# Patient Record
Sex: Female | Born: 1955 | Race: White | Hispanic: No | Marital: Married | Smoking: Current every day smoker
Health system: Southern US, Community
[De-identification: ages and names within clinical notes are randomized; demographics above are authoritative.]

## PROBLEM LIST (undated history)

## (undated) DIAGNOSIS — F1010 Alcohol abuse, uncomplicated: Secondary | ICD-10-CM

## (undated) DIAGNOSIS — F329 Major depressive disorder, single episode, unspecified: Secondary | ICD-10-CM

## (undated) DIAGNOSIS — J449 Chronic obstructive pulmonary disease, unspecified: Secondary | ICD-10-CM

## (undated) DIAGNOSIS — M1990 Unspecified osteoarthritis, unspecified site: Secondary | ICD-10-CM

## (undated) DIAGNOSIS — K759 Inflammatory liver disease, unspecified: Secondary | ICD-10-CM

## (undated) DIAGNOSIS — K219 Gastro-esophageal reflux disease without esophagitis: Secondary | ICD-10-CM

## (undated) DIAGNOSIS — I10 Essential (primary) hypertension: Secondary | ICD-10-CM

## (undated) DIAGNOSIS — F32A Depression, unspecified: Secondary | ICD-10-CM

---

## 2008-12-07 ENCOUNTER — Emergency Department: Payer: Self-pay | Admitting: Emergency Medicine

## 2010-08-11 ENCOUNTER — Emergency Department: Payer: Self-pay | Admitting: Emergency Medicine

## 2011-01-21 ENCOUNTER — Ambulatory Visit: Payer: Self-pay

## 2011-02-04 ENCOUNTER — Ambulatory Visit: Payer: Self-pay | Admitting: Gynecologic Oncology

## 2011-02-11 ENCOUNTER — Ambulatory Visit: Payer: Self-pay | Admitting: Gynecologic Oncology

## 2011-03-01 ENCOUNTER — Ambulatory Visit: Payer: Self-pay | Admitting: Gynecologic Oncology

## 2012-08-26 ENCOUNTER — Emergency Department: Payer: Self-pay | Admitting: Emergency Medicine

## 2012-08-26 LAB — CBC
HCT: 44.2 % (ref 35.0–47.0)
HGB: 15.1 g/dL (ref 12.0–16.0)
MCH: 31.8 pg (ref 26.0–34.0)
MCHC: 34.2 g/dL (ref 32.0–36.0)
RDW: 15.3 % — ABNORMAL HIGH (ref 11.5–14.5)
WBC: 10.4 10*3/uL (ref 3.6–11.0)

## 2012-08-26 LAB — BASIC METABOLIC PANEL
BUN: 10 mg/dL (ref 7–18)
Co2: 27 mmol/L (ref 21–32)
Creatinine: 0.67 mg/dL (ref 0.60–1.30)
EGFR (African American): >60
EGFR (Non-African Amer.): >60

## 2013-04-22 ENCOUNTER — Emergency Department: Payer: Self-pay | Admitting: Emergency Medicine

## 2013-04-22 LAB — COMPREHENSIVE METABOLIC PANEL
ALK PHOS: 161 U/L — AB
ANION GAP: 9 (ref 7–16)
Albumin: 4.6 g/dL (ref 3.4–5.0)
BILIRUBIN TOTAL: 1 mg/dL (ref 0.2–1.0)
BUN: 13 mg/dL (ref 7–18)
CO2: 24 mmol/L (ref 21–32)
CREATININE: 0.95 mg/dL (ref 0.60–1.30)
Calcium, Total: 9.7 mg/dL (ref 8.5–10.1)
Chloride: 106 mmol/L (ref 98–107)
EGFR (Non-African Amer.): >60
GLUCOSE: 143 mg/dL — AB (ref 65–99)
Osmolality: 280 (ref 275–301)
Potassium: 4 mmol/L (ref 3.5–5.1)
SGOT(AST): 49 U/L — ABNORMAL HIGH (ref 15–37)
SGPT (ALT): 64 U/L (ref 12–78)
Sodium: 139 mmol/L (ref 136–145)
TOTAL PROTEIN: 9.2 g/dL — AB (ref 6.4–8.2)

## 2013-04-22 LAB — CBC WITH DIFFERENTIAL/PLATELET
BASOS ABS: 0.1 10*3/uL (ref 0.0–0.1)
Basophil %: 1.2 %
EOS PCT: 0.6 %
Eosinophil #: 0.1 10*3/uL (ref 0.0–0.7)
HCT: 53.5 % — ABNORMAL HIGH (ref 35.0–47.0)
HGB: 17.8 g/dL — ABNORMAL HIGH (ref 12.0–16.0)
Lymphocyte #: 3.1 10*3/uL (ref 1.0–3.6)
Lymphocyte %: 24.4 %
MCH: 32.6 pg (ref 26.0–34.0)
MCHC: 33.3 g/dL (ref 32.0–36.0)
MCV: 98 fL (ref 80–100)
MONO ABS: 0.6 x10 3/mm (ref 0.2–0.9)
MONOS PCT: 4.7 %
Neutrophil #: 8.9 10*3/uL — ABNORMAL HIGH (ref 1.4–6.5)
Neutrophil %: 69.1 %
Platelet: 391 10*3/uL (ref 150–440)
RBC: 5.47 10*6/uL — AB (ref 3.80–5.20)
RDW: 15.8 % — ABNORMAL HIGH (ref 11.5–14.5)
WBC: 12.9 10*3/uL — AB (ref 3.6–11.0)

## 2013-04-22 LAB — URINALYSIS, COMPLETE
BILIRUBIN, UR: NEGATIVE
BLOOD: NEGATIVE
Bacteria: NONE SEEN
Glucose,UR: NEGATIVE mg/dL (ref 0–75)
LEUKOCYTE ESTERASE: NEGATIVE
Nitrite: NEGATIVE
PH: 5 (ref 4.5–8.0)
Protein: 30
RBC,UR: 1 /HPF (ref 0–5)
SPECIFIC GRAVITY: 1.026 (ref 1.003–1.030)
WBC UR: 1 /HPF (ref 0–5)

## 2013-04-22 LAB — ETHANOL

## 2013-04-22 LAB — LIPASE, BLOOD: Lipase: 248 U/L (ref 73–393)

## 2015-08-31 ENCOUNTER — Encounter: Payer: Self-pay | Admitting: Emergency Medicine

## 2015-08-31 ENCOUNTER — Emergency Department: Payer: Self-pay

## 2015-08-31 ENCOUNTER — Emergency Department
Admission: EM | Admit: 2015-08-31 | Discharge: 2015-09-01 | Disposition: A | Discharge status: Home / Self Care | Payer: Self-pay | Attending: Emergency Medicine | Admitting: Emergency Medicine

## 2015-08-31 DIAGNOSIS — F1010 Alcohol abuse, uncomplicated: Secondary | ICD-10-CM | POA: Insufficient documentation

## 2015-08-31 DIAGNOSIS — J42 Unspecified chronic bronchitis: Secondary | ICD-10-CM | POA: Insufficient documentation

## 2015-08-31 DIAGNOSIS — F17200 Nicotine dependence, unspecified, uncomplicated: Secondary | ICD-10-CM | POA: Insufficient documentation

## 2015-08-31 DIAGNOSIS — F329 Major depressive disorder, single episode, unspecified: Secondary | ICD-10-CM | POA: Insufficient documentation

## 2015-08-31 DIAGNOSIS — J410 Simple chronic bronchitis: Secondary | ICD-10-CM

## 2015-08-31 HISTORY — DX: Major depressive disorder, single episode, unspecified: F32.9

## 2015-08-31 HISTORY — DX: Depression, unspecified: F32.A

## 2015-08-31 LAB — COMPREHENSIVE METABOLIC PANEL
ALBUMIN: 4.3 g/dL (ref 3.5–5.0)
ALK PHOS: 156 U/L — AB (ref 38–126)
ALT: 161 U/L — AB (ref 14–54)
ANION GAP: 11 (ref 5–15)
AST: 254 U/L — ABNORMAL HIGH (ref 15–41)
BUN: 10 mg/dL (ref 6–20)
CALCIUM: 8.5 mg/dL — AB (ref 8.9–10.3)
CHLORIDE: 99 mmol/L — AB (ref 101–111)
CO2: 25 mmol/L (ref 22–32)
Creatinine, Ser: 0.71 mg/dL (ref 0.44–1.00)
GFR calc Af Amer: >60 mL/min (ref >60)
GFR calc non Af Amer: >60 mL/min (ref >60)
GLUCOSE: 107 mg/dL — AB (ref 65–99)
Potassium: 4.2 mmol/L (ref 3.5–5.1)
SODIUM: 135 mmol/L (ref 135–145)
Total Bilirubin: 0.8 mg/dL (ref 0.3–1.2)
Total Protein: 8.2 g/dL — ABNORMAL HIGH (ref 6.5–8.1)

## 2015-08-31 LAB — CBC
HEMATOCRIT: 43.7 % (ref 35.0–47.0)
HEMOGLOBIN: 14.7 g/dL (ref 12.0–16.0)
MCH: 31.5 pg (ref 26.0–34.0)
MCHC: 33.7 g/dL (ref 32.0–36.0)
MCV: 93.3 fL (ref 80.0–100.0)
Platelets: 243 10*3/uL (ref 150–440)
RBC: 4.68 MIL/uL (ref 3.80–5.20)
RDW: 14.6 % — ABNORMAL HIGH (ref 11.5–14.5)
WBC: 5.8 10*3/uL (ref 3.6–11.0)

## 2015-08-31 LAB — ETHANOL: Alcohol, Ethyl (B): 324 mg/dL (ref <5)

## 2015-08-31 LAB — LIPASE, BLOOD: LIPASE: 34 U/L (ref 11–51)

## 2015-08-31 LAB — SALICYLATE LEVEL: Salicylate Lvl: <4 mg/dL (ref 2.8–30.0)

## 2015-08-31 LAB — ACETAMINOPHEN LEVEL

## 2015-08-31 NOTE — ED Notes (Addendum)
Pt comes to ED for detox of ETOH, pt states x 2821yr she's been heavy drinker with worsening x2-263months. Pt drinks aprox 1/4 quart of liquor and then beer in the afternoons. Pt last drink was around Pt states she takes vicodin for pain, denies any other drug use.  Denies any pain. Pt fidgety at this time.

## 2015-08-31 NOTE — ED Notes (Signed)
Crit EToH: 324

## 2015-08-31 NOTE — ED Notes (Addendum)
Pt presents to ED requesting help with detoxing from alcohol. Pt reports she drinks moonshine in the morning to get rid of the shakes and then drinks beer throughout the day. Pt states she has had a lot to drink today. Pt reports she has a cough and thinks she might have bronchitis. Pt reports shortness of breath. Pt speaking in complete sentences in triage without difficulty. Pt tearful in triage.

## 2015-09-01 LAB — ETHANOL: Alcohol, Ethyl (B): <5 mg/dL

## 2015-09-01 MED ORDER — THIAMINE HCL 100 MG/ML IJ SOLN: 100.0000 mg | Freq: Every day | INTRAMUSCULAR | Status: DC

## 2015-09-01 MED ORDER — PREDNISONE 20 MG PO TABS: 40.0000 mg | ORAL_TABLET | Freq: Every day | ORAL | Status: DC

## 2015-09-01 MED ORDER — IPRATROPIUM-ALBUTEROL 18-103 MCG/ACT IN AERO: 2.0000 | INHALATION_SPRAY | Freq: Four times a day (QID) | RESPIRATORY_TRACT | Status: DC | PRN

## 2015-09-01 MED ORDER — FOLIC ACID 1 MG PO TABS
1.0000 mg | ORAL_TABLET | Freq: Every day | ORAL | Status: DC
  Administered 2015-09-01: 1 mg via ORAL
  Filled 2015-09-01: qty 1

## 2015-09-01 MED ORDER — PREDNISONE 20 MG PO TABS
40.0000 mg | ORAL_TABLET | Freq: Once | ORAL | Status: AC
  Administered 2015-09-01: 40 mg via ORAL
  Filled 2015-09-01: qty 2

## 2015-09-01 MED ORDER — ONDANSETRON HCL 4 MG/2ML IJ SOLN
4.0000 mg | Freq: Once | INTRAMUSCULAR | Status: AC
  Administered 2015-09-01: 4 mg via INTRAVENOUS

## 2015-09-01 MED ORDER — IPRATROPIUM-ALBUTEROL 20-100 MCG/ACT IN AERS: 1.0000 | INHALATION_SPRAY | Freq: Four times a day (QID) | RESPIRATORY_TRACT | Status: DC | PRN

## 2015-09-01 MED ORDER — ONDANSETRON HCL 4 MG/2ML IJ SOLN
INTRAMUSCULAR | Status: AC
  Administered 2015-09-01: 4 mg via INTRAVENOUS
  Filled 2015-09-01: qty 2

## 2015-09-01 MED ORDER — ADULT MULTIVITAMIN W/MINERALS CH
1.0000 | ORAL_TABLET | Freq: Every day | ORAL | Status: DC
  Administered 2015-09-01: 1 via ORAL
  Filled 2015-09-01: qty 1

## 2015-09-01 MED ORDER — IPRATROPIUM-ALBUTEROL 0.5-2.5 (3) MG/3ML IN SOLN
3.0000 mL | Freq: Once | RESPIRATORY_TRACT | Status: AC
  Administered 2015-09-01: 3 mL via RESPIRATORY_TRACT

## 2015-09-01 MED ORDER — IPRATROPIUM-ALBUTEROL 0.5-2.5 (3) MG/3ML IN SOLN
3.0000 mL | Freq: Four times a day (QID) | RESPIRATORY_TRACT | Status: DC | PRN
  Filled 2015-09-01: qty 3

## 2015-09-01 MED ORDER — LORAZEPAM 2 MG PO TABS: 0.0000 mg | ORAL_TABLET | Freq: Four times a day (QID) | ORAL | Status: DC

## 2015-09-01 MED ORDER — LORAZEPAM 2 MG PO TABS: 0.0000 mg | ORAL_TABLET | Freq: Two times a day (BID) | ORAL | Status: DC

## 2015-09-01 MED ORDER — VITAMIN B-1 100 MG PO TABS
100.0000 mg | ORAL_TABLET | Freq: Every day | ORAL | Status: DC
  Administered 2015-09-01: 100 mg via ORAL
  Filled 2015-09-01: qty 1

## 2015-09-01 MED ORDER — LORAZEPAM 1 MG PO TABS
1.0000 mg | ORAL_TABLET | Freq: Four times a day (QID) | ORAL | Status: DC | PRN
  Administered 2015-09-01: 1 mg via ORAL
  Filled 2015-09-01: qty 1

## 2015-09-01 MED ORDER — LORAZEPAM 2 MG/ML IJ SOLN
1.0000 mg | Freq: Four times a day (QID) | INTRAMUSCULAR | Status: DC | PRN
  Administered 2015-09-01: 1 mg via INTRAVENOUS
  Filled 2015-09-01: qty 1

## 2015-09-01 NOTE — BH Assessment (Signed)
Received phone call from [facility] ([phone]) stating he received the updated BAC, via faxed, from the nurse Zollie Scale(Olivia). He requested patient get ready because they are about to come pick her up. Writer updated the patient's nurse Zollie Scale(Olivia) of the phone call.

## 2015-09-01 NOTE — ED Provider Notes (Signed)
-----------------------------------------   10:06 AM on 09/01/2015 -----------------------------------------  [facility] has accepted the patient and have arrived to pick her up. We'll discharge to [facility] for additional detox treatment.  Gayla DossEryka A Otha Monical, MD 09/01/15 1006

## 2015-09-01 NOTE — ED Notes (Signed)
Pt given breakfast, resting in bed, tolerating PO intake

## 2015-09-01 NOTE — ED Notes (Signed)
Resting quietly in bed.  No complaints at this time.  States last drink was approx 1700 08/31/15 and that she has been through detox before and knows what s/sx to report.

## 2015-09-01 NOTE — ED Notes (Signed)
Hooked patient back to monitor, satting in the high 80's put on 3L O2.

## 2015-09-01 NOTE — Discharge Instructions (Signed)
Alcohol Abuse and Nutrition Alcohol abuse is any pattern of alcohol consumption that harms your health, relationships, or work. Alcohol abuse can affect how your body breaks down and absorbs nutrients from food by causing your liver to work abnormally. Additionally, many people who abuse alcohol do not eat enough carbohydrates, protein, fat, vitamins, and minerals. This can cause poor nutrition (malnutrition) and a lack of nutrients (nutrient deficiencies), which can lead to further complications. Nutrients that are commonly lacking (deficient) among people who abuse alcohol include:  Vitamins.  Vitamin A. This is stored in your liver. It is important for your vision, metabolism, and ability to fight off infections (immunity).  B vitamins. These include vitamins such as folate, thiamin, and niacin. These are important in new cell growth and maintenance.  Vitamin C. This plays an important role in iron absorption, wound healing, and immunity.  Vitamin D. This is produced by your liver, but you can also get vitamin D from food. Vitamin D is necessary for your body to absorb and use calcium.  Minerals.  Calcium. This is important for your bones and your heart and blood vessel (cardiovascular) function.  Iron. This is important for blood, muscle, and nervous system functioning.  Magnesium. This plays an important role in muscle and nerve function, and it helps to control blood sugar and blood pressure.  Zinc. This is important for the normal function of your nervous system and digestive system (gastrointestinal tract). Nutrition is an essential component of therapy for alcohol abuse. Your health care provider or dietitian will work with you to design a plan that can help restore nutrients to your body and prevent potential complications. WHAT IS MY PLAN? Your dietitian may develop a specific diet plan that is based on your condition and any other complications you may have. A diet plan will  commonly include:  A balanced diet.  Grains: 6-8 oz per day.  Vegetables: 2-3 cups per day.  Fruits: 1-2 cups per day.  Meat and other protein: 5-6 oz per day.  Dairy: 2-3 cups per day.  Vitamin and mineral supplements. WHAT DO I NEED TO KNOW ABOUT ALCOHOL AND NUTRITION?  Consume foods that are high in antioxidants, such as grapes, berries, nuts, green tea, and dark green and orange vegetables. This can help to counteract some of the stress that is placed on your liver by consuming alcohol.  Avoid food and drinks that are high in fat and sugar. Foods such as sugared soft drinks, salty snack foods, and candy contain empty calories. This means that they lack important nutrients such as protein, fiber, and vitamins.  Eat frequent meals and snacks. Try to eat 5-6 small meals each day.  Eat a variety of fresh fruits and vegetables each day. This will help you get plenty of water, fiber, and vitamins in your diet.  Drink plenty of water and other clear fluids. Try to drink at least 48-64 oz (1.5-2 L) of water per day.  If you are a vegetarian, eat a variety of protein-rich foods. Pair whole grains with plant-based proteins at meals and snacks to obtain the greatest nutrient benefit from your food. For example, eat rice with beans, put peanut butter on whole-grain toast, or eat oatmeal with sunflower seeds.  Soak beans and whole grains overnight before cooking. This can help your body to absorb the nutrients more easily.  Include foods fortified with vitamins and minerals in your diet. Commonly fortified foods include milk, orange juice, cereal, and bread.  If you  are malnourished, your dietitian may recommend a high-protein, high-calorie diet. This may include: °¨ 2,000-3,000 calories (kilocalories) per day. °¨ 70-100 grams of protein per day. °· Your health care provider may recommend a complete nutritional supplement beverage. This can help to restore calories, protein, and vitamins to  your body. Depending on your condition, you may be advised to consume this instead of or in addition to meals. °· Limit your intake of caffeine. Replace drinks like coffee and black tea with decaffeinated coffee and herbal tea. °· Eat a variety of foods that are high in omega fatty acids. These include fish, nuts and seeds, and soybeans. These foods may help your liver to recover and may also stabilize your mood. °· Certain medicines may cause changes in your appetite, taste, and weight. Work with your health care provider and dietitian to make any adjustments to your medicines and diet plan. °· Include other healthy lifestyle choices in your daily routine. °¨ Be physically active. °¨ Get enough sleep. °¨ Spend time doing activities that you enjoy. °· If you are unable to take in enough food and calories by mouth, your health care provider may recommend a feeding tube. This is a tube that passes through your nose and throat, directly into your stomach. Nutritional supplement beverages can be given to you through the feeding tube to help you get the nutrients you need. °· Take vitamin or mineral supplements as recommended by your health care provider. °WHAT FOODS CAN I EAT? °Grains °Enriched pasta. Enriched rice. Fortified whole-grain bread. Fortified whole-grain cereal. Barley. Brown rice. Quinoa. Millet. °Vegetables °All fresh, frozen, and canned vegetables. Spinach. Kale. Artichoke. Carrots. Winter squash and pumpkin. Sweet potatoes. Broccoli. Cabbage. Cucumbers. Tomatoes. Sweet peppers. Green beans. Peas. Corn. °Fruits °All fresh and frozen fruits. Berries. Grapes. Mango. Papaya. Guava. Cherries. Apples. Bananas. Peaches. Plums. Pineapple. Watermelon. Cantaloupe. Oranges. Avocado. °Meats and Other Protein Sources °Beef liver. Lean beef. Pork. Fresh and canned chicken. Fresh fish. Oysters. Sardines. Canned tuna. Shrimp. Eggs with yolks. Nuts and seeds. Peanut butter. Beans and lentils. Soybeans.  Tofu. °Dairy °Whole, low-fat, and nonfat milk. Whole, low-fat, and nonfat yogurt. Cottage cheese. Sour cream. Hard and soft cheeses. °Beverages °Water. Herbal tea. Decaffeinated coffee. Decaffeinated green tea. 100% fruit juice. 100% vegetable juice. Instant breakfast shakes. °Condiments °Ketchup. Mayonnaise. Mustard. Salad dressing. Barbecue sauce. °Sweets and Desserts °Sugar-free ice cream. Sugar-free pudding. Sugar-free gelatin. °Fats and Oils °Butter. Vegetable oil, flaxseed oil, olive oil, and walnut oil. °Other °Complete nutrition shakes. Protein bars. Sugar-free gum. °The items listed above may not be a complete list of recommended foods or beverages. Contact your dietitian for more options. °WHAT FOODS ARE NOT RECOMMENDED? °Grains °Sugar-sweetened breakfast cereals. Flavored instant oatmeal. Fried breads. °Vegetables °Breaded or deep-fried vegetables. °Fruits °Dried fruit with added sugar. Candied fruit. Canned fruit in syrup. °Meats and Other Protein Sources °Breaded or deep-fried meats. °Dairy °Flavored milks. Fried cheese curds or fried cheese sticks. °Beverages °Alcohol. Sugar-sweetened soft drinks. Sugar-sweetened tea. Caffeinated coffee and tea. °Condiments °Sugar. Honey. Agave nectar. Molasses. °Sweets and Desserts °Chocolate. Cake. Cookies. Candy. °Other °Potato chips. Pretzels. Salted nuts. Candied nuts. °The items listed above may not be a complete list of foods and beverages to avoid. Contact your dietitian for more information. °  °This information is not intended to replace advice given to you by your health care provider. Make sure you discuss any questions you have with your health care provider. °  °Document Released: 01/09/2005 Document Revised: 04/07/2014 Document Reviewed: 10/18/2013 °Elsevier Interactive Patient   Education 2016 [employer]   Chronic Bronchitis Chronic bronchitis is a lasting inflammation of the bronchial tubes, which are the tubes that carry air into your lungs.  This is inflammation that occurs:   On most days of the week.   For at least three months at a time.   Over a period of two years in a row. When the bronchial tubes are inflamed, they start to produce mucus. The inflammation and buildup of mucus make it more difficult to breathe. Chronic bronchitis is usually a permanent problem and is one type of chronic obstructive pulmonary disease (COPD). People with chronic bronchitis are at greater risk for getting repeated colds, or respiratory infections. CAUSES  Chronic bronchitis most often occurs in people who have:  Long-standing, severe asthma.  A history of smoking.  Asthma and who also smoke. SIGNS AND SYMPTOMS  Chronic bronchitis may cause the following:   A cough that brings up mucus (productive cough).  Shortness of breath.  Early morning headache.  Wheezing.  Chest discomfort.   Recurring respiratory infections. DIAGNOSIS  Your health care provider may confirm the diagnosis by:  Taking your medical history.  Performing a physical exam.  Taking a chest X-ray.   Performing pulmonary function tests. TREATMENT  Treatment involves controlling symptoms with medicines, oxygen therapy, or making lifestyle changes, such as exercising and eating a healthy, well-balanced diet. Medicines could include:  Inhalers to improve air flow in and out of your lungs.  Antibiotics to treat bacterial infections, such as pneumonia, sinus infections, and acute bronchitis. As a preventative measure, your health care provider may recommend routine vaccinations for influenza and pneumonia. This is to prevent infection and hospitalization since you may be more at risk for these types of infections.  HOME CARE INSTRUCTIONS  Take medicines only as directed by your health care provider.   If you smoke cigarettes, chew tobacco, or use electronic cigarettes, quit. If you need help quitting, ask your health care provider.  Avoid pollen,  dust, animal dander, molds, smoke, and other things that cause shortness of breath or wheezing attacks.  Talk to your health care provider about possible exercise routines. Regular exercise is very important to help you feel better.  If you are prescribed oxygen use at home follow these guidelines:  Never smoke while using oxygen. Oxygen does not burn or explode, but flammable materials will burn faster in the presence of oxygen.  Keep a Government social research officer close by. Let your fire department know that you have oxygen in your home.  Warn visitors not to smoke near you when you are using oxygen. Put up "no smoking" signs in your home where you most often use the oxygen.  Regularly test your smoke detectors at home to make sure they work. If you receive care in your home from a nurse or other health care provider, he or she may also check to make sure your smoke detectors work.  Ask your health care provider whether you would benefit from a pulmonary rehabilitation program.  Do not wait to get medical care if you have any concerning symptoms. Delays could cause permanent injury and may be life threatening. SEEK MEDICAL CARE IF:  You have increased coughing or shortness of breath or both.  You have muscle aches.  You have chest pain.  Your mucus gets thicker.  Your mucus changes from clear or white to yellow, green, gray, or bloody. SEEK IMMEDIATE MEDICAL CARE IF:  Your usual medicines do not stop your wheezing.  You have increased difficulty breathing.   You have any problems with the medicine you are taking, such as a rash, itching, swelling, or trouble breathing. MAKE SURE YOU:   Understand these instructions.  Will watch your condition.  Will get help right away if you are not doing well or get worse.   This information is not intended to replace advice given to you by your health care provider. Make sure you discuss any questions you have with your health care provider.    Document Released: 01/02/2006 Document Revised: 04/07/2014 Document Reviewed: 04/25/2013 Elsevier Interactive Patient Education [employer]

## 2015-09-01 NOTE — ED Notes (Signed)
Pt resting in bed, 02 on, resp even and unlabored, pt in no acute distress, eyes closed

## 2015-09-01 NOTE — ED Notes (Signed)
Pt removed from o2, placed on RA, resting in bed in no distress

## 2015-09-01 NOTE — ED Notes (Signed)
Tele [facility] at bedside, interview in process.

## 2015-09-01 NOTE — ED Notes (Signed)
Pt accepted at [facility]

## 2015-09-01 NOTE — ED Provider Notes (Addendum)
[name] Emergency Department Provider Note  ____________________________________________  Time seen: Approximately 2:54 AM  I have reviewed the triage vital signs and the nursing notes.   HISTORY  Chief Complaint Addiction Problem; Cough; and Shortness of Breath    HPI Kathryn Banks is a 60 y.o. female reports a previous history of alcohol abuse. She reports that she's been drinking fairly heavily for the last 2-3 months, up to 18 beers a day and a shot of liquor for "the shakes" in the morning for the last several weeks.  Patient reports that she wants to stop drinking and this is affecting her life. She denies any hallucinations, withdrawal at this time, shakes, or thoughts about wanting to harm herself or anyone else. She does occasionally abuse hydrocodone but denies having taken any in the last 2 days.  Addition the patient reports that she's had a chronic nonproductive cough for about the last 2 months. She reports that she has seen her doctor and they're suspicious he may have "COPD" as she is still smoking. She denies any fevers chills or shortness of breath, though does wheeze from time to time. Reports his symptoms are ongoing they seem to be just slightly worse over the last 2 weeks.   Past Medical History  Diagnosis Date  . Depression     There are no active problems to display for this patient.   Past Surgical History  Procedure Laterality Date  . Cesarean section      Current Outpatient Rx  Name  Route  Sig  Dispense  Refill  . albuterol-ipratropium (COMBIVENT) 18-103 MCG/ACT inhaler   Inhalation   Inhale 2 puffs into the lungs every 6 (six) hours as needed for wheezing or shortness of breath.   1 Inhaler   0   . predniSONE (DELTASONE) 20 MG tablet   Oral   Take 2 tablets (40 mg total) by mouth daily with breakfast.   10 tablet   0     Allergies Review of patient's allergies indicates no known allergies.  No family history  on file.  Social History Social History  Substance Use Topics  . Smoking status: Current Every Day Smoker -- 2.00 packs/day  . Smokeless tobacco: None  . Alcohol Use: Yes    Review of Systems Constitutional: No fever/chills Eyes: No visual changes. ENT: No sore throat. Cardiovascular: Denies chest pain. Respiratory: Denies shortness of breath.Frequent dry cough and slight wheezing. Gastrointestinal: No abdominal pain.  No nausea, no vomiting.  No diarrhea.  No constipation. Genitourinary: Negative for dysuria. Musculoskeletal: Negative for back pain. Skin: Negative for rash. Neurological: Negative for headaches, focal weakness or numbness.  Denies pregnancy  10-point ROS otherwise negative.  Denies any history of complicated withdrawals, seizures, or requiring hospitalization or hallucinating during withdrawals previous. She has gone through detox several months ago. ____________________________________________   PHYSICAL EXAM:  VITAL SIGNS: ED Triage Vitals  Enc Vitals Group     BP 08/31/15 1845 140/89 mmHg     Pulse Rate 08/31/15 1845 72     Resp 08/31/15 1845 24     Temp 08/31/15 1845 98.1 F (36.7 C)     Temp Source 08/31/15 1845 Oral     SpO2 08/31/15 1845 95 %     Weight 08/31/15 1845 120 lb (54.432 kg)     Height 08/31/15 1845  (1.651 m)     Head Cir --      Peak Flow --      Pain  Score 08/31/15 1849 0     Pain Loc --      Pain Edu? --      Excl. in GC? --    Constitutional: Alert and oriented. Well appearing and in no acute distress.Pleasant, sits up and conversant. Eyes: Conjunctivae are normal. PERRL. EOMI. Head: Atraumatic. Nose: No congestion/rhinnorhea. Mouth/Throat: Mucous membranes are moist.  Oropharynx non-erythematous. Neck: No stridor.   Cardiovascular: Normal rate, regular rhythm. Grossly normal heart sounds.  Good peripheral circulation. Respiratory: Normal respiratory effort.  No retractions. Faint end expiratory wheezes. Occasional  dry nonproductive cough. Speaks in full sentences without use of accessory muscles or evidence of distress.  Gastrointestinal: Soft and nontender. No distention.  Musculoskeletal: No lower extremity tenderness nor edema.  No joint effusions. Neurologic:  Normal speech and language. No gross focal neurologic deficits are appreciated. No gait instability. Skin:  Skin is warm, dry and intact. No rash noted. Psychiatric: Mood and affect are normal. Speech and behavior are normal.  ____________________________________________   LABS (all labs ordered are listed, but only abnormal results are displayed)  Labs Reviewed  COMPREHENSIVE METABOLIC PANEL - Abnormal; Notable for the following:    Chloride 99 (*)    Glucose, Bld 107 (*)    Calcium 8.5 (*)    Total Protein 8.2 (*)    AST 254 (*)    ALT 161 (*)    Alkaline Phosphatase 156 (*)    All other components within normal limits  ETHANOL - Abnormal; Notable for the following:    Alcohol, Ethyl (B) 324 (*)    All other components within normal limits  ACETAMINOPHEN LEVEL - Abnormal; Notable for the following:    Acetaminophen (Tylenol), Serum <10 (*)    All other components within normal limits  CBC - Abnormal; Notable for the following:    RDW 14.6 (*)    All other components within normal limits  SALICYLATE LEVEL  LIPASE, BLOOD  URINE DRUG SCREEN, QUALITATIVE ([facility] ONLY)   ____________________________________________  EKG   ____________________________________________  RADIOLOGY  DG Chest 2 View (Final result) Result time: 08/31/15 19:23:54   Final result by Rad Results In Interface (08/31/15 19:23:54)   Narrative:   CLINICAL DATA: Alcohol detox.  EXAM: CHEST 2 VIEW  COMPARISON: 08/26/2012.  FINDINGS: The heart size and mediastinal contours are within normal limits. Both lungs are clear. The visualized skeletal structures are unremarkable. Scoliosis deformity is convex towards the left.  IMPRESSION: No  active cardiopulmonary disease.   Electronically Signed By: Signa Kellaylor Stroud M.D. On: 08/31/2015 19:23       ____________________________________________   PROCEDURES  Procedure(s) performed: None  Critical Care performed: No  ____________________________________________   INITIAL IMPRESSION / ASSESSMENT AND PLAN / ED COURSE  Pertinent labs & imaging results that were available during my care of the patient were reviewed by me and considered in my medical decision making (see chart for details).   1. Alcohol abuse. Presently voluntary, denies hallucinations or self harmful ideations or other concern. She does report withdrawals occurring in the mornings, but is presently showing no evidence of withdrawal. I will place her on withdrawal protocol, and have ordered consultation for assistance in finding detox for her. She does not have a history of complicated withdrawal presently appears quite stable.  2. Chronic cough. Appears most consistent with likely chronic bronchitis or likely chronic obstructive pulmonary disease. Afebrile, no leukocytosis, clear lungs and chest x-ray with no suggestion of pneumonia infiltrate or obvious infectious process. I will treat her with  a 5 day dose of steroids, and also ordered her nebulizer for relief of symptoms. The patient will follow-up with her primary care regarding this, and presently is stable and will continue to be monitored in her ED stay with continuation of prednisone and nebulizers as needed all awaiting detox placement.  ----------------------------------------- 2:58 AM on 09/01/2015 -----------------------------------------  The patient is resting comfortably. Her lungs are clear and she denies any concerns at present. I find her medically clear to continue her ED stay in the behavioral health unit pending TTS consult for detox. ____________________________________________   FINAL CLINICAL IMPRESSION(S) / ED  DIAGNOSES  Final diagnoses:  Simple chronic bronchitis (HCC)  Alcohol abuse      Sharyn Creamer, MD 09/01/15 0258  ----------------------------------------- 8:51 AM on 09/01/2015 -----------------------------------------  Ongoing care assigned to Dr. Chari Manning. Patient resting comfortably, was requiring some Ativan throughout the evening.  Sharyn Creamer, MD 09/01/15 [phone]
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
## 2015-09-26 ENCOUNTER — Ambulatory Visit: Payer: Self-pay | Attending: Internal Medicine

## 2015-09-26 ENCOUNTER — Ambulatory Visit
Admission: RE | Admit: 2015-09-26 | Discharge: 2015-09-26 | Disposition: A | Discharge status: Home / Self Care | Payer: Self-pay | Source: Ambulatory Visit | Attending: Internal Medicine | Admitting: Internal Medicine

## 2015-09-26 VITALS — BP 153/91 | HR 69 | Temp 98.1°F | Resp 24 | Ht 64.0 in | Wt 138.2 lb

## 2015-09-26 DIAGNOSIS — Z0000 Encounter for general adult medical examination without abnormal findings: Secondary | ICD-10-CM

## 2015-09-26 NOTE — Progress Notes (Signed)
Subjective:     Patient ID: Kathryn PoeDonna Banks, female   DOB: 04/03/1955, 60 y.o.   MRN: [id number]  HPI   Review of Systems     Objective:   Physical Exam  Pulmonary/Chest: Right breast exhibits no inverted nipple, no mass, no nipple discharge, no skin change and no tenderness. Left breast exhibits no inverted nipple, no mass, no nipple discharge, no skin change and no tenderness. Breasts are symmetrical.  Genitourinary: No labial fusion. There is no rash, tenderness, lesion or injury on the right labia. There is no rash, tenderness, lesion or injury on the left labia. Uterus is not deviated, not enlarged, not fixed and not tender. Cervix exhibits no motion tenderness, no discharge and no friability. Right adnexum displays no mass, no tenderness and no fullness. Left adnexum displays no mass, no tenderness and no fullness. No erythema, tenderness or bleeding in the vagina. No foreign body around the vagina. No signs of injury around the vagina. No vaginal discharge found.       Assessment:     60 year old female  [facility].  Patient screened, and meets [facility] eligibility.  Patient does not have insurance, Medicare or Medicaid.  Handout given on Affordable Care Act.  Instructed patient on breast self-exam using teach back methodCBE unremarkable.  No mass or lump palpated.  Noted protruding xyphoid process which was not described on previous exam.  Patient states PCP aware, and not concerned.  Patient has had 20 + pound weight loss since last [facility] visit in 2012.  Pelvic exam normal.  Observed nabothean cyst superior os.    Plan:     Sent for bilateral screening mammogram.  Specimen collected for pap.

## 2015-09-28 LAB — PAP LB AND HPV HIGH-RISK
HPV, HIGH-RISK: NEGATIVE
PAP Smear Comment: 0

## 2015-10-08 NOTE — Progress Notes (Unsigned)
Phoned patient with normal mammogram and pap results.  Copy to [facility].

## 2016-01-27 ENCOUNTER — Ambulatory Visit (HOSPITAL_COMMUNITY)
Admission: RE | Admit: 2016-01-27 | Discharge: 2016-01-27 | Disposition: A | Discharge status: Home / Self Care | Payer: Self-pay | Attending: Psychiatry | Admitting: Psychiatry

## 2016-01-27 ENCOUNTER — Encounter (HOSPITAL_COMMUNITY): Payer: Self-pay | Admitting: Behavioral Health

## 2016-01-27 NOTE — BH Assessment (Signed)
Assessment Note  Kathryn Banks is a  60 y.o. female [facility] (and accompanied by friend) who requested detox services.  Pt reported as follows:  She is consuming alcohol, opioids, valium, and xanax, with last use of alcohol, opioids, and valium occurring 01/27/16.  Pt was nauseated and was dry-heaving when assessed.  Pt reported that she drank half a 1/5 of vodka [facility].  When asked about substance use, Pt reported several psychosocial stressors, including the pending anniversary of her son's death and poor business performance.    Pt was dressed in street clothes and appeared somewhat disheveled.  She had poor eye contact, as she was experiencing dry heaves.  Pt's mood was preoccupied, and affect was irritable.  Pt endorsed despondency, insomnia, poor appetite, and lethargy.  Pt denied suicidal ideation, any past suicide attempts, homicidal ideation, auditory/visual hallucination, or self-injury.  Pt endorsed ongoing substance use.  Thought processes were slowed, and thought content was goal-oriented.  Memory and concentration were grossly intact.  Speech was slow.  Insight and judgment were fair.  Impulse control was poor.  Consulted with Irving BurtonL. Parks, NP, who determined that Pt did not meet inpatient criteria.  Provided Pt with outpatient/IOP substance use resources, and encouraged Pt to visit the ED to check for possible nausea.  Diagnosis: Polysubstance Use Disorder; Substance-induced mood disorder  Past Medical History:  Past Medical History:  Diagnosis Date  . Depression     Past Surgical History:  Procedure Laterality Date  . CESAREAN SECTION      Family History:  Family History  Problem Relation Age of Onset  . Breast cancer Neg Hx     Social History:  reports that she has been smoking.  She has been smoking about 2.00 packs per day. She does not have any smokeless tobacco history on file. She reports that she drinks alcohol. She reports that she  uses drugs, including Opium and Benzodiazepines.  Additional Social History:  Alcohol / Drug Use Pain Medications: See PTA Prescriptions: See PTA Over the Counter: See PTA History of alcohol / drug use?: Yes Substance #1 Name of Substance 1: Alcohol 1 - Amount (size/oz): (S) Varied (Pt stated that she drank 1/2 a fifth of vodka and beer) 1 - Frequency: Daily 1 - Duration: Ongoing 1 - Last Use / Amount: 01/27/16 (Last consumption was approx 2 pm 01/27/16) Substance #2 Name of Substance 2: Opioids 2 - Amount (size/oz): Varied 2 - Frequency: Daily where possible 2 x day 2 - Duration: Ongoing 2 - Last Use / Amount: 01/27/16 (Last use was about 1 pm 01/27/16) Substance #3 Name of Substance 3: Valium 3 - Amount (size/oz): Varied 3 - Frequency: Daily 3 - Duration: Ongoing 3 - Last Use / Amount: 01/27/16 Substance #4 Name of Substance 4: Xanax 4 - Duration: Ongoing 4 - Last Use / Amount: Not sure  CIWA: CIWA-Ar BP: (!) 144/97 Pulse Rate: 83 COWS:    Allergies: No Known Allergies  Home Medications:  (Not in a hospital admission)  OB/GYN Status:  Patient's last menstrual period was 09/25/2005 (approximate).  General Assessment Data [facility] TTS Assessment: In system Is this a Tele or Face-to-Face Assessment?: Face-to-Face Is this an Initial Assessment or a Re-assessment for this encounter?: Initial Assessment Marital status: Married Pregnancy Status: No Living Arrangements: Spouse/significant other Can pt return to current living arrangement?: Yes Admission Status: Voluntary Is patient capable of signing voluntary admission?: Yes Referral Source: Self/Family/Friend  Medical Screening Exam [facility] Walk-in ONLY) Medical Exam completed: Yes  Crisis Care Plan Living Arrangements: Spouse/significant other Name of Psychiatrist: None Name of Therapist: None  Education Status Is patient currently in school?: No  Risk to self with the  past 6 months Suicidal Ideation: No Has patient been a risk to self within the past 6 months prior to admission? : No Suicidal Intent: No Has patient had any suicidal intent within the past 6 months prior to admission? : No Is patient at risk for suicide?: No Suicidal Plan?: No Has patient had any suicidal plan within the past 6 months prior to admission? : No Access to Means: No What has been your use of drugs/alcohol within the last 12 months?: Alcohol (beer, vodka), opioids, valium, xanax Previous Attempts/Gestures: No Other Self Harm Risks: significant substance use Recent stressful life event(s): Loss (Comment) (Upcoming anniversary of son's death) Persecutory voices/beliefs?: No Depression: Yes Depression Symptoms: Despondent, Insomnia, Tearfulness, Feeling angry/irritable (poor appetite) Substance abuse history and/or treatment for substance abuse?: Yes ([facility]) Suicide prevention information given to non-admitted patients: Not applicable  Risk to Others within the past 6 months Homicidal Ideation: No Does patient have any lifetime risk of violence toward others beyond the six months prior to admission? : No Thoughts of Harm to Others: No Current Homicidal Intent: No Current Homicidal Plan: No Access to Homicidal Means: No History of harm to others?: No Assessment of Violence: None Noted Does patient have access to weapons?: Yes (Comment) (Guns at home for family defense) Criminal Charges Pending?: No Does patient have a court date: No Is patient on probation?: No  Psychosis Hallucinations: None noted Delusions: None noted  Mental Status Report Appearance/Hygiene: Unremarkable, Other (Comment) (Street clothes) Eye Contact: Poor Motor Activity: Unsteady, Unremarkable (dry heaves) Speech: Argumentative Level of Consciousness: Irritable Mood: Sad Affect: Irritable Anxiety Level: None Thought Processes: Coherent, Relevant Judgement: Impaired Orientation: Person, Place,  Time, Situation Obsessive Compulsive Thoughts/Behaviors: None  Cognitive Functioning Concentration: Fair Memory: Remote Intact, Recent Intact IQ: Average Insight: Fair Impulse Control: Poor Appetite: Poor Sleep: Decreased  ADLScreening ([facility] Assessment Services) Patient's cognitive ability adequate to safely complete daily activities?: Yes Patient able to express need for assistance with ADLs?: Yes Independently performs ADLs?: Yes (appropriate for developmental age)  Prior Inpatient Therapy Prior Inpatient Therapy: Yes (But -- Pt treated at [facility] for detox) Prior Therapy Dates: 2017 Prior Therapy Facilty/Provider(s): [facility] Reason for Treatment: Detox  Prior Outpatient Therapy Prior Outpatient Therapy: No  ADL Screening (condition at time of admission) Patient's cognitive ability adequate to safely complete daily activities?: Yes Is the patient deaf or have difficulty hearing?: No Does the patient have difficulty seeing, even when wearing glasses/contacts?: No Does the patient have difficulty concentrating, remembering, or making decisions?: No Patient able to express need for assistance with ADLs?: Yes Does the patient have difficulty dressing or bathing?: No Independently performs ADLs?: Yes (appropriate for developmental age) Does the patient have difficulty walking or climbing stairs?: No Weakness of Legs: None Weakness of Arms/Hands: None  Home Assistive Devices/Equipment Home Assistive Devices/Equipment: None  Therapy Consults (therapy consults require a physician order) PT Evaluation Needed: No OT Evalulation Needed: No SLP Evaluation Needed: No Abuse/Neglect Assessment (Assessment to be complete while patient is alone) Physical Abuse: Denies Verbal Abuse: Denies Sexual Abuse: Denies Exploitation of patient/patient's resources: Denies Self-Neglect: Denies Values / Beliefs Cultural Requests During Hospitalization: None Spiritual Requests During Hospitalization:  None Consults Spiritual Care Consult Needed: No Social Work Consult Needed: No Merchant navy officerAdvance Directives (For  Healthcare) Does patient have an advance directive?: No Would patient like information on creating an advanced directive?: No - patient declined information    Additional Information 1:1 In Past 12 Months?: No CIRT Risk: No Elopement Risk: No Does patient have medical clearance?: Yes     Disposition:  Disposition Initial Assessment Completed for this Encounter: Yes Disposition of Patient: Other dispositions Other disposition(s): Other (Comment) (Sent to ED for med check; recomment o/p)  On Site Evaluation by:   Reviewed with Physician:    Dorris Fetch Dell Briner 01/27/2016 3:16 PM

## 2016-01-27 NOTE — H&P (Signed)
Behavioral Health Medical Screening Exam  Kathryn PoeDonna Banks is an 60 y.o. female.  Total Time spent with patient: 15 minutes  Psychiatric Specialty Exam: Physical Exam  Constitutional: She appears well-developed.  HENT:  Head: Normocephalic.  Eyes: Pupils are equal, round, and reactive to light.  Neck: Normal range of motion.  Cardiovascular: Normal rate.   Respiratory: Effort normal.  GI: Soft.  Musculoskeletal: Normal range of motion.  Neurological: She is alert.  Skin: Skin is warm and dry.    Review of Systems  Psychiatric/Behavioral: Positive for substance abuse (etoh). Negative for depression, hallucinations, memory loss and suicidal ideas. The patient is not nervous/anxious and does not have insomnia.   All other systems reviewed and are negative.   BP (!) 144/97 (BP Location: Left Arm)   Pulse 83   Temp 98.7 F (37.1 C) (Oral)   Resp 18   LMP 09/25/2005 (Approximate)   SpO2 96%    General Appearance: Disheveled  Eye Contact:  Fair  Speech:  Garbled and Slurred  Volume:  Normal  Mood:  Angry and Depressed  Affect:  Non-Congruent, Depressed and Tearful  Thought Process:  Disorganized  Orientation:  Full (Time, Place, and Person)  Thought Content:  Illogical and intoxicated  Suicidal Thoughts:  No  Homicidal Thoughts:  No  Memory:  Immediate;   Fair Recent;   Fair Remote;   Fair  Judgement:  Poor  Insight:  Lacking  Psychomotor Activity:  Decreased  Concentration: Concentration: Fair and Attention Span: Fair  Recall:  Poor  Fund of Knowledge:Fair  Language: Fair  Akathisia:  No  Handed:  Right  AIMS (if indicated):     Assets:  Financial Resources/Insurance Housing Resilience Social Support  Sleep:       Musculoskeletal: Strength & Muscle Tone: within normal limits Gait & Station: normal Patient leans: Front  BP (!) 144/97 (BP Location: Left Arm)   Pulse 83   Temp 98.7 F (37.1 C) (Oral)   Resp 18   LMP 09/25/2005 (Approximate)   SpO2 96%    Recommendations:  Pt does not meet inpatient psychiatric admission criteria.   Laveda AbbeLaurie Britton Vaibhav Fogleman, NP 01/27/2016, 3:05 PM

## 2016-04-20 ENCOUNTER — Emergency Department: Payer: Self-pay

## 2016-04-20 ENCOUNTER — Encounter: Payer: Self-pay | Admitting: Emergency Medicine

## 2016-04-20 ENCOUNTER — Emergency Department
Admission: EM | Admit: 2016-04-20 | Discharge: 2016-04-20 | Disposition: A | Discharge status: Home / Self Care | Payer: Self-pay | Attending: Emergency Medicine | Admitting: Emergency Medicine

## 2016-04-20 DIAGNOSIS — Z79899 Other long term (current) drug therapy: Secondary | ICD-10-CM | POA: Insufficient documentation

## 2016-04-20 DIAGNOSIS — F17200 Nicotine dependence, unspecified, uncomplicated: Secondary | ICD-10-CM | POA: Insufficient documentation

## 2016-04-20 DIAGNOSIS — J189 Pneumonia, unspecified organism: Secondary | ICD-10-CM | POA: Insufficient documentation

## 2016-04-20 DIAGNOSIS — R531 Weakness: Secondary | ICD-10-CM

## 2016-04-20 HISTORY — DX: Alcohol abuse, uncomplicated: F10.10

## 2016-04-20 HISTORY — DX: Gastro-esophageal reflux disease without esophagitis: K21.9

## 2016-04-20 LAB — BLOOD CULTURE ID PANEL (REFLEXED)
ACINETOBACTER BAUMANNII: NOT DETECTED
CANDIDA ALBICANS: NOT DETECTED
CANDIDA GLABRATA: NOT DETECTED
CANDIDA TROPICALIS: NOT DETECTED
Candida krusei: NOT DETECTED
Candida parapsilosis: NOT DETECTED
Carbapenem resistance: NOT DETECTED
ENTEROBACTER CLOACAE COMPLEX: NOT DETECTED
ENTEROBACTERIACEAE SPECIES: DETECTED — AB
ESCHERICHIA COLI: DETECTED — AB
Enterococcus species: NOT DETECTED
HAEMOPHILUS INFLUENZAE: NOT DETECTED
Klebsiella oxytoca: NOT DETECTED
Klebsiella pneumoniae: NOT DETECTED
LISTERIA MONOCYTOGENES: NOT DETECTED
NEISSERIA MENINGITIDIS: NOT DETECTED
PROTEUS SPECIES: NOT DETECTED
PSEUDOMONAS AERUGINOSA: NOT DETECTED
STREPTOCOCCUS PNEUMONIAE: NOT DETECTED
STREPTOCOCCUS PYOGENES: NOT DETECTED
Serratia marcescens: NOT DETECTED
Staphylococcus aureus (BCID): NOT DETECTED
Staphylococcus species: NOT DETECTED
Streptococcus agalactiae: NOT DETECTED
Streptococcus species: NOT DETECTED

## 2016-04-20 LAB — ETHANOL

## 2016-04-20 LAB — BASIC METABOLIC PANEL
ANION GAP: 9 (ref 5–15)
BUN: 26 mg/dL — AB (ref 6–20)
CHLORIDE: 104 mmol/L (ref 101–111)
CO2: 23 mmol/L (ref 22–32)
Calcium: 8.5 mg/dL — ABNORMAL LOW (ref 8.9–10.3)
Creatinine, Ser: 1.51 mg/dL — ABNORMAL HIGH (ref 0.44–1.00)
GFR calc Af Amer: 43 mL/min — ABNORMAL LOW (ref >60)
GFR calc non Af Amer: 37 mL/min — ABNORMAL LOW (ref >60)
Glucose, Bld: 114 mg/dL — ABNORMAL HIGH (ref 65–99)
POTASSIUM: 4 mmol/L (ref 3.5–5.1)
SODIUM: 136 mmol/L (ref 135–145)

## 2016-04-20 LAB — URINALYSIS, COMPLETE (UACMP) WITH MICROSCOPIC
BACTERIA UA: NONE SEEN
BILIRUBIN URINE: NEGATIVE
Glucose, UA: NEGATIVE mg/dL
KETONES UR: NEGATIVE mg/dL
NITRITE: NEGATIVE
PH: 7 (ref 5.0–8.0)
PROTEIN: NEGATIVE mg/dL
Specific Gravity, Urine: 1.004 — ABNORMAL LOW (ref 1.005–1.030)

## 2016-04-20 LAB — URINE DRUG SCREEN, QUALITATIVE (ARMC ONLY)
AMPHETAMINES, UR SCREEN: NOT DETECTED
BARBITURATES, UR SCREEN: NOT DETECTED
Benzodiazepine, Ur Scrn: POSITIVE — AB
Cannabinoid 50 Ng, Ur ~~LOC~~: NOT DETECTED
Cocaine Metabolite,Ur ~~LOC~~: NOT DETECTED
MDMA (Ecstasy)Ur Screen: NOT DETECTED
METHADONE SCREEN, URINE: NOT DETECTED
Opiate, Ur Screen: NOT DETECTED
Phencyclidine (PCP) Ur S: NOT DETECTED
TRICYCLIC, UR SCREEN: NOT DETECTED

## 2016-04-20 LAB — CBC
HEMATOCRIT: 33.5 % — AB (ref 35.0–47.0)
HEMOGLOBIN: 11.5 g/dL — AB (ref 12.0–16.0)
MCH: 30.9 pg (ref 26.0–34.0)
MCHC: 34.4 g/dL (ref 32.0–36.0)
MCV: 90 fL (ref 80.0–100.0)
Platelets: 305 10*3/uL (ref 150–440)
RBC: 3.72 MIL/uL — ABNORMAL LOW (ref 3.80–5.20)
RDW: 15.5 % — AB (ref 11.5–14.5)
WBC: 18.5 10*3/uL — ABNORMAL HIGH (ref 3.6–11.0)

## 2016-04-20 LAB — LACTIC ACID, PLASMA: LACTIC ACID, VENOUS: 1.1 mmol/L (ref 0.5–1.9)

## 2016-04-20 LAB — POCT RAPID STREP A: STREPTOCOCCUS, GROUP A SCREEN (DIRECT): NEGATIVE

## 2016-04-20 MED ORDER — CEFTRIAXONE SODIUM-DEXTROSE 1-3.74 GM-% IV SOLR
1.0000 g | INTRAVENOUS | Status: DC
  Administered 2016-04-20: 1 g via INTRAVENOUS
  Filled 2016-04-20: qty 50

## 2016-04-20 MED ORDER — AMOXICILLIN 500 MG PO CAPS: 500.0000 mg | ORAL_CAPSULE | Freq: Three times a day (TID) | ORAL | 0 refills | Status: DC

## 2016-04-20 MED ORDER — SODIUM CHLORIDE 0.9 % IV BOLUS (SEPSIS)
1000.0000 mL | Freq: Once | INTRAVENOUS | Status: AC
  Administered 2016-04-20: 1000 mL via INTRAVENOUS

## 2016-04-20 MED ORDER — AZITHROMYCIN 250 MG PO TABS: 250.0000 mg | ORAL_TABLET | Freq: Every day | ORAL | 0 refills | Status: DC

## 2016-04-20 MED ORDER — DEXTROSE 5 % IV SOLN
500.0000 mg | Freq: Once | INTRAVENOUS | Status: AC
  Administered 2016-04-20: 500 mg via INTRAVENOUS
  Filled 2016-04-20: qty 500

## 2016-04-20 MED ORDER — CEFTRIAXONE SODIUM 1 G IJ SOLR: 1.0000 g | INTRAMUSCULAR | Status: DC

## 2016-04-20 MED ORDER — ACETAMINOPHEN 500 MG PO TABS
1000.0000 mg | ORAL_TABLET | Freq: Once | ORAL | Status: AC
  Administered 2016-04-20: 1000 mg via ORAL
  Filled 2016-04-20: qty 2

## 2016-04-20 NOTE — ED Notes (Signed)
Pt noted to be coming in the lobby doors, unsteady on her feet; says she went outside to find a cigarette; assisted pt to chair and asked to call for help should she need to get up again; verbalized understanding

## 2016-04-20 NOTE — ED Notes (Signed)
Patient transported to CT 

## 2016-04-20 NOTE — ED Notes (Signed)
Spoke with EDP regarding pt unable to go back to [facility] at this time. Consulted with TTS and social work to see if any other options of detox facilties that pt could be discharged to. None other available at this time. Pt denies SI or HI at this time.

## 2016-04-20 NOTE — Discharge Instructions (Signed)

## 2016-04-20 NOTE — ED Notes (Signed)
Pt from [facility] via POV with c/o "increased weakness" over unknown period of time, cough for 3 months and headache x 1 week; pt awake and alert at stat registration

## 2016-04-20 NOTE — ED Notes (Signed)
Patient tried to use the bathroom and was unable to make it and voided on the floor.  Patient given blue scrubs since she wet her own clothes.

## 2016-04-20 NOTE — [facility] Counselor (Signed)
Spoke to RN who stated pt was sent here from [facility] because of "increased weakness, cough and inability to walk on her own". Pt has been treated at the ED and is clear to be discharged with medications, however [facility] is refusing to take pt back. Pt is not SI, HI, and denies any AVH however does not need a TTS consult. Pt was at [facility] for alcohol detox. Social work consult has been requested instead for pt placement. RN stated she would call social work.   [address]Kristin Ariyan Brisendine PenermonLPC, [facility]A.

## 2016-04-20 NOTE — ED Triage Notes (Signed)
Patient here with complaint of earaches times 2 weeks. Patient states that since Wednesday she has become progressively weaker and has had headaches. Patient was brought in by [facility] where she is currently receiving detox for alcohol. Patient reports that he last drink was on Wednesday. Patient states that she was drinking an 18 pack of beer and some liquor daily.

## 2016-04-20 NOTE — BHH Counselor (Signed)
TTS spoke with pt regarding follow-up Tx options. Pt was provided with information on Substance Abuse Intensive Outpatient Tx with [facility] and [facility]. Pt verbalized her understanding of this and pt was receptive to these Tx options.   Pt provided this Clinical research associatewriter with her boyfriend's number (Rod Sutten: [phone]). Pt reports she currently lives with her boyfriend.

## 2016-04-20 NOTE — ED Notes (Signed)
Joni ReiningNicole at [facility] contacted about patient condition and she states that pt is unable to go back due to condition when patient left, "lethargic, unable to walk on her own". Pt ambulates with stand by assistance as she can be unsteady on her feet momentarily. Pt alert and oriented X 4 at this time. Will notify MD.

## 2016-04-20 NOTE — ED Provider Notes (Signed)
-----------------------------------------   3:23 PM on 04/20/2016 -----------------------------------------   Blood pressure 124/87, pulse 86, temperature 98.2 F (36.8 C), temperature source Oral, resp. rate 18, height 5\' 4"  (1.626 m), weight 132 lb (59.9 kg), last menstrual period 09/25/2005, SpO2 97 %.  Assuming care from Dr. Dolores FrameSung of [facility] P[facility]na Banks is a 61 y.o. female with a chief complaint of Weakness and Headache .    Patient returned to her baseline and is fully awake, neurologically intact, with no further complaints. She was diagnosed with pneumonia and started on azithromycin by Dr. Dolores FrameSung. We spoke with [facility] who refused to accept patient back. Patient is here voluntarily with no suicidality. She was there for alcohol detox. Patient was seen by [facility] and provided with outpatient resources for other centers for detox. She lives with her boyfriend who is coming to pick her up. Patient will be discharged with prescription and instructions left by Dr. Dolores FrameSung.   Nita Sicklearolina Jaquin Coy, MD 04/20/16 1525

## 2016-04-20 NOTE — ED Notes (Signed)
Attempted to get patient awake to get urine sample but she was unable to use the bathroom despite the bolus.  Will try again before shift change.

## 2016-04-20 NOTE — ED Provider Notes (Signed)
[facility] Emergency Department Provider Note   ____________________________________________   First MD Initiated Contact with Patient 04/20/16 [phone]     (approximate)  I have reviewed the triage vital signs and the nursing notes.   HISTORY  Chief Complaint Weakness and Headache  History limited by somnolence  HPI Kathryn Banks is a 61 y.o. female brought to the ED from [facility] with multiple medical complaints. Patient is currently a resident at [facility] for alcohol detox; last drink was 4 days ago.Reportedly complained to triage nurse of bilateral earaches for 2 weeks. Complains of a several day history of generalized weakness, headache, productive cough and myalgias. Denies chest pain or shortness of breath. Rest of history is limited secondary to patient's somnolence.   Past Medical History:  Diagnosis Date  . Acid reflux   . Depression   . ETOH abuse     There are no active problems to display for this patient.   Past Surgical History:  Procedure Laterality Date  . CESAREAN SECTION      Prior to Admission medications   Medication Sig Start Date End Date Taking? Authorizing Provider  albuterol-ipratropium (COMBIVENT) 18-103 MCG/ACT inhaler Inhale 2 puffs into the lungs every 6 (six) hours as needed for wheezing or shortness of breath. 09/01/15   Sharyn Creamer, MD  predniSONE (DELTASONE) 20 MG tablet Take 2 tablets (40 mg total) by mouth daily with breakfast. 09/01/15   Sharyn Creamer, MD    Allergies Patient has no known allergies.  Family History  Problem Relation Age of Onset  . Breast cancer Neg Hx     Social History Social History  Substance Use Topics  . Smoking status: Current Every Day Smoker    Packs/day: 2.00  . Smokeless tobacco: Not on file  . Alcohol use Yes     Comment: Daily -- beer and vodka    Review of Systems  Constitutional: No fever/chills. Eyes: No visual changes. ENT: Positive for bilateral ear pain. No sore  throat. Cardiovascular: Denies chest pain. Respiratory: Positive for cough. Denies shortness of breath. Gastrointestinal: No abdominal pain.  No nausea, no vomiting.  No diarrhea.  No constipation. Genitourinary: Negative for dysuria. Musculoskeletal: Negative for back pain. Skin: Negative for rash. Neurological: Positive for headache. Negative for focal weakness or numbness. Psychiatric:Positive for alcohol abuse.  10-point ROS otherwise negative.  ____________________________________________   PHYSICAL EXAM:  VITAL SIGNS: ED Triage Vitals [04/20/16 0052]  Enc Vitals Group     BP 119/78     Pulse Rate 91     Resp 18     Temp 98.2 F (36.8 C)     Temp Source Oral     SpO2 97 %     Weight 132 lb (59.9 kg)     Height 5\' 4"  (1.626 m)     Head Circumference      Peak Flow      Pain Score 8     Pain Loc      Pain Edu?      Excl. in GC?     Constitutional: Asleep, difficult to arouse. Disheveled appearing and in no acute distress. Eyes: Conjunctivae are normal. PERRL. EOMI. Head: Atraumatic. Ears: Cerumen in both ears. Nose: No congestion/rhinnorhea. Mouth/Throat: Mucous membranes are moist.  Oropharynx erythematous without tonsillar swelling, exudates or peritonsillar abscess.  There is no hoarse or muffled voice.  There is no drooling. Neck: No stridor.  Supple neck without meningismus. Hematological/Lymphatic/Immunilogical: No cervical lymphadenopathy. Cardiovascular: Normal rate, regular rhythm.  Grossly normal heart sounds.  Good peripheral circulation. Respiratory: Normal respiratory effort.  No retractions. Lungs CTAB. Loose cough noted. Gastrointestinal: Soft and nontender. No distention. No abdominal bruits. No CVA tenderness. Musculoskeletal: No lower extremity tenderness nor edema.  No joint effusions. Neurologic:  Drowsy but arousable to voice. Normal speech and language. No gross focal neurologic deficits are appreciated.  Skin:  Skin is warm, dry and intact.  No rash noted. Psychiatric: Mood and affect are normal. Speech and behavior are normal.  ____________________________________________   LABS (all labs ordered are listed, but only abnormal results are displayed)  Labs Reviewed  BASIC METABOLIC PANEL - Abnormal; Notable for the following:       Result Value   Glucose, Bld 114 (*)    BUN 26 (*)    Creatinine, Ser 1.51 (*)    Calcium 8.5 (*)    GFR calc non Af Amer 37 (*)    GFR calc Af Amer 43 (*)    All other components within normal limits  CBC - Abnormal; Notable for the following:    WBC 18.5 (*)    RBC 3.72 (*)    Hemoglobin 11.5 (*)    HCT 33.5 (*)    RDW 15.5 (*)    All other components within normal limits  CULTURE, GROUP A STREP (THRC)  CULTURE, BLOOD (ROUTINE X 2)  CULTURE, BLOOD (ROUTINE X 2)  ETHANOL  URINALYSIS, COMPLETE (UACMP) WITH MICROSCOPIC  LACTIC ACID, PLASMA  LACTIC ACID, PLASMA  POCT RAPID STREP A  CBG MONITORING, ED   ____________________________________________  EKG  ED ECG REPORT I, SUNG,JADE J, the attending physician, personally viewed and interpreted this ECG.   Date: 04/20/2016  EKG Time: 0051  Rate: 94  Rhythm: normal EKG, normal sinus rhythm  Axis: Normal  Intervals:none  ST&T Change: Nonspecific  ____________________________________________  RADIOLOGY  CT head interpreted per Dr. Manus GunningEhinger: No acute intracranial abnormality.  Chest 1 view interpreted per Dr. Jake SamplesFujinaga: Patchy atelectasis or infiltrate at the lung bases ____________________________________________   PROCEDURES  Procedure(s) performed: None  Procedures  Critical Care performed: No  ____________________________________________   INITIAL IMPRESSION / ASSESSMENT AND PLAN / ED COURSE  Pertinent labs & imaging results that were available during my care of the patient were reviewed by me and considered in my medical decision making (see chart for details).  61 year old female brought from alcohol detox  facility for generalized malaise and cold type symptoms. Review of records reveal that patient received trazodone, Zoloft and Wellbutrin last evening. This may account for patient's drowsiness. However, lobby nurse also noted patient to go outside and stumbled in. Lab work demonstrate leukocytosis and dehydration. Will obtain CT head, chest x-ray, initiate IV fluid resuscitation and observe in the ED.  Clinical Course as of Apr 20 746  Sun Apr 20, 2016  [id number] IV antibiotics ordered for pneumonia seen on x-ray.  [JS]  [id number] Patient attempted to provide urine specimen but voided on the floor. Slowly becoming more awake and alert. CT scan was negative. Suspect drowsiness is secondary to nighttime medications. Lactate is pending as well as IV antibiotics. Anticipate discharge once patient is fully awake and as long as she does not require oxygen. I am told patient may not be allowed back to [facility] given her medical problems. If that is the case, then [facility] will evaluate. Care transferred to Dr. Don PerkingVeronese.  [JS]    Clinical Course User Index [JS] Irean HongJade J Sung, MD     ____________________________________________   FINAL  CLINICAL IMPRESSION(S) / ED DIAGNOSES  Final diagnoses:  Weakness  Community acquired pneumonia, unspecified laterality      NEW MEDICATIONS STARTED DURING THIS VISIT:  New Prescriptions   No medications on file     Note:  This document was prepared using Dragon voice recognition software and may include unintentional dictation errors.    Irean Hong, MD 04/20/16 [phone]

## 2016-04-20 NOTE — ED Notes (Signed)
Patsy at [facility] called regarding patient and left her number [phone].  She will be available until 0745 then Joni Reiningicole will be the contact person after that.  During this conversation Patsy stated the patient probably will not be allowed back to [facility] because of her increasing medical problems.

## 2016-04-20 NOTE — Progress Notes (Addendum)
PHARMACY - PHYSICIAN COMMUNICATION CRITICAL VALUE ALERT - BLOOD CULTURE IDENTIFICATION (BCID)  Results for orders placed or performed during the hospital encounter of 04/20/16  Blood Culture ID Panel (Reflexed) (Collected: 04/20/2016  7:10 AM)  Result Value Ref Range   Enterococcus species NOT DETECTED NOT DETECTED   Listeria monocytogenes NOT DETECTED NOT DETECTED   Staphylococcus species NOT DETECTED NOT DETECTED   Staphylococcus aureus NOT DETECTED NOT DETECTED   Streptococcus species NOT DETECTED NOT DETECTED   Streptococcus agalactiae NOT DETECTED NOT DETECTED   Streptococcus pneumoniae NOT DETECTED NOT DETECTED   Streptococcus pyogenes NOT DETECTED NOT DETECTED   Acinetobacter baumannii NOT DETECTED NOT DETECTED   Enterobacteriaceae species DETECTED (A) NOT DETECTED   Enterobacter cloacae complex NOT DETECTED NOT DETECTED   Escherichia coli DETECTED (A) NOT DETECTED   Klebsiella oxytoca NOT DETECTED NOT DETECTED   Klebsiella pneumoniae NOT DETECTED NOT DETECTED   Proteus species NOT DETECTED NOT DETECTED   Serratia marcescens NOT DETECTED NOT DETECTED   Carbapenem resistance NOT DETECTED NOT DETECTED   Haemophilus influenzae NOT DETECTED NOT DETECTED   Neisseria meningitidis NOT DETECTED NOT DETECTED   Pseudomonas aeruginosa NOT DETECTED NOT DETECTED   Candida albicans NOT DETECTED NOT DETECTED   Candida glabrata NOT DETECTED NOT DETECTED   Candida krusei NOT DETECTED NOT DETECTED   Candida parapsilosis NOT DETECTED NOT DETECTED   Candida tropicalis NOT DETECTED NOT DETECTED    Name of physician (or Provider) Contacted: Per Dr. Zenda AlpersWebster, night BCID results go to charge RN. I spoke with Database administratorLeigh RN. Patient was discharged on amoxicillin and azithromycin. She'll follow up with Dr. Zenda AlpersWebster.  Changes to prescribed antibiotics required: None at this point.   1/22 0722 lab called back to report GPC aerobic bottle x 1 of 4, staph sp, mecA not detected. I spoke with Marisue IvanLiz in ED who will  follow up with MD/patient.   Carola FrostNathan A Maudell Stanbrough, Pharm.D., BCPS Clinical Pharmacist 04/20/2016  9:39 PM

## 2016-04-20 NOTE — ED Notes (Addendum)
Pt unable to void at this time due to having just went to urinate PTA of this RN.

## 2016-04-20 NOTE — ED Notes (Signed)
Upon entering room patient has pulled out IV to L AC, site bleeding onto floor. Asked patient why she has not hit call bell, pt has been instructed to hit call bell X 2 since this RN arrival. Pt assisted onto toilet and back into bed at this time.

## 2016-04-21 ENCOUNTER — Telehealth: Payer: Self-pay | Admitting: Emergency Medicine

## 2016-04-21 LAB — BLOOD CULTURE ID PANEL (REFLEXED)
ACINETOBACTER BAUMANNII: NOT DETECTED
CANDIDA ALBICANS: NOT DETECTED
CANDIDA GLABRATA: NOT DETECTED
Candida krusei: NOT DETECTED
Candida parapsilosis: NOT DETECTED
Candida tropicalis: NOT DETECTED
ENTEROBACTER CLOACAE COMPLEX: NOT DETECTED
ENTEROBACTERIACEAE SPECIES: NOT DETECTED
ENTEROCOCCUS SPECIES: NOT DETECTED
Escherichia coli: NOT DETECTED
Haemophilus influenzae: NOT DETECTED
Klebsiella oxytoca: NOT DETECTED
Klebsiella pneumoniae: NOT DETECTED
LISTERIA MONOCYTOGENES: NOT DETECTED
Methicillin resistance: NOT DETECTED
NEISSERIA MENINGITIDIS: NOT DETECTED
PROTEUS SPECIES: NOT DETECTED
PSEUDOMONAS AERUGINOSA: NOT DETECTED
STAPHYLOCOCCUS SPECIES: DETECTED — AB
STREPTOCOCCUS AGALACTIAE: NOT DETECTED
STREPTOCOCCUS PNEUMONIAE: NOT DETECTED
STREPTOCOCCUS SPECIES: NOT DETECTED
Serratia marcescens: NOT DETECTED
Staphylococcus aureus (BCID): NOT DETECTED
Streptococcus pyogenes: NOT DETECTED

## 2016-04-21 NOTE — ED Notes (Signed)
Left patient a phone message at 2300 on 04/20/16 for patient to return to the ED due to abnormal blood culture bottle.

## 2016-04-21 NOTE — Telephone Encounter (Signed)
Called patient due to positive blood culture to ask her to return to the ED for exam.  Left message asking pateint to return to the ED or call charge nurse for questions.

## 2016-04-22 LAB — CULTURE, GROUP A STREP (THRC)

## 2016-04-23 LAB — CULTURE, BLOOD (ROUTINE X 2)

## 2016-04-24 ENCOUNTER — Telehealth: Payer: Self-pay | Admitting: Emergency Medicine

## 2016-05-12 ENCOUNTER — Telehealth: Payer: Self-pay | Admitting: Emergency Medicine

## 2016-05-12 NOTE — Telephone Encounter (Signed)
Patient called me back.  She says she is feeling tired and worn out still.  She has not had her follow up with her pcp yet, and it is scheduled for Friday.  I explained the blood cultures, but asked that she call pcp to inform her of how she feels and assure that the pcp has reviewed the labwork from the ed visit.  Patient agrees.

## 2016-05-12 NOTE — Telephone Encounter (Signed)
Pt has left message asking me to call her.  I left her a message.

## 2016-09-08 ENCOUNTER — Emergency Department
Admission: EM | Admit: 2016-09-08 | Discharge: 2016-09-08 | Disposition: A | Discharge status: Home / Self Care | Payer: Self-pay | Attending: Emergency Medicine | Admitting: Emergency Medicine

## 2016-09-08 DIAGNOSIS — Z79899 Other long term (current) drug therapy: Secondary | ICD-10-CM | POA: Insufficient documentation

## 2016-09-08 DIAGNOSIS — F17200 Nicotine dependence, unspecified, uncomplicated: Secondary | ICD-10-CM | POA: Insufficient documentation

## 2016-09-08 DIAGNOSIS — F10920 Alcohol use, unspecified with intoxication, uncomplicated: Secondary | ICD-10-CM

## 2016-09-08 DIAGNOSIS — F10120 Alcohol abuse with intoxication, uncomplicated: Secondary | ICD-10-CM | POA: Insufficient documentation

## 2016-09-08 DIAGNOSIS — F1010 Alcohol abuse, uncomplicated: Secondary | ICD-10-CM

## 2016-09-08 LAB — COMPREHENSIVE METABOLIC PANEL
ALBUMIN: 4.3 g/dL (ref 3.5–5.0)
ALT: 60 U/L — AB (ref 14–54)
AST: 101 U/L — ABNORMAL HIGH (ref 15–41)
Alkaline Phosphatase: 144 U/L — ABNORMAL HIGH (ref 38–126)
Anion gap: 9 (ref 5–15)
BUN: 11 mg/dL (ref 6–20)
CHLORIDE: 108 mmol/L (ref 101–111)
CO2: 26 mmol/L (ref 22–32)
CREATININE: 0.79 mg/dL (ref 0.44–1.00)
Calcium: 9 mg/dL (ref 8.9–10.3)
GFR calc non Af Amer: >60 mL/min (ref >60)
Glucose, Bld: 92 mg/dL (ref 65–99)
Potassium: 4.7 mmol/L (ref 3.5–5.1)
SODIUM: 143 mmol/L (ref 135–145)
Total Bilirubin: 0.8 mg/dL (ref 0.3–1.2)
Total Protein: 8.8 g/dL — ABNORMAL HIGH (ref 6.5–8.1)

## 2016-09-08 LAB — CBC
HEMATOCRIT: 43.9 % (ref 35.0–47.0)
Hemoglobin: 14.9 g/dL (ref 12.0–16.0)
MCH: 29.5 pg (ref 26.0–34.0)
MCHC: 33.9 g/dL (ref 32.0–36.0)
MCV: 87 fL (ref 80.0–100.0)
Platelets: 296 10*3/uL (ref 150–440)
RBC: 5.05 MIL/uL (ref 3.80–5.20)
RDW: 19.6 % — AB (ref 11.5–14.5)
WBC: 8.7 10*3/uL (ref 3.6–11.0)

## 2016-09-08 LAB — URINE DRUG SCREEN, QUALITATIVE (ARMC ONLY)
AMPHETAMINES, UR SCREEN: NOT DETECTED
BENZODIAZEPINE, UR SCRN: POSITIVE — AB
Barbiturates, Ur Screen: NOT DETECTED
Cannabinoid 50 Ng, Ur ~~LOC~~: NOT DETECTED
Cocaine Metabolite,Ur ~~LOC~~: NOT DETECTED
MDMA (Ecstasy)Ur Screen: NOT DETECTED
Methadone Scn, Ur: NOT DETECTED
Opiate, Ur Screen: NOT DETECTED
PHENCYCLIDINE (PCP) UR S: NOT DETECTED
Tricyclic, Ur Screen: NOT DETECTED

## 2016-09-08 LAB — ETHANOL: Alcohol, Ethyl (B): 359 mg/dL (ref <5)

## 2016-09-08 MED ORDER — LORAZEPAM 2 MG PO TABS
2.0000 mg | ORAL_TABLET | Freq: Once | ORAL | Status: AC
  Administered 2016-09-08: 2 mg via ORAL
  Filled 2016-09-08: qty 1

## 2016-09-08 NOTE — ED Notes (Signed)
Patient assigned to appropriate care area.  Introduced myself to her  Patient oriented to unit/care area: Informed that, for their safety, care areas are designed for safety and monitored by security cameras at all times; and visiting hours explained to patient. Patient verbalizes understanding, and verbal contract for safety obtained  Environment secured  Clothing changed by traci in triage  - one ring remains on her left ring finger - not removed per policy - pt adamant about not removing it now  We will have to allow her to calm down then retry to remove    1/1 bags of belongings labeled and placed in storage

## 2016-09-08 NOTE — ED Triage Notes (Addendum)
Pt arrived via EMS from home for reports of drinking an entire bottle of cherry moonshine. Pt appears intoxicated in triage. EMS reports VSS. Pt denies SI/HI.

## 2016-09-08 NOTE — ED Notes (Signed)

## 2016-09-08 NOTE — ED Notes (Signed)
Pt eating dinner tray °

## 2016-09-08 NOTE — ED Notes (Signed)
Pt woke up and wants to go home.  md aware.

## 2016-09-08 NOTE — BH Assessment (Signed)
TTS unable to complete consult at this time. Patient was too impaired.

## 2016-09-08 NOTE — ED Notes (Signed)
meds given.  Pt tearful  Sitting in floor.

## 2016-09-08 NOTE — ED Notes (Signed)
Pt sleeping. 

## 2016-09-08 NOTE — ED Notes (Signed)
PT  VOL °

## 2016-09-08 NOTE — Discharge Instructions (Signed)
You were seen in the emergency department for alcohol intoxication.  Please seek help from the recommended resources for assistance with your alcohol dependence.  If you have any thoughts of hurting herself or others, please call 911 or return to the emergency department.  Please avoid drug and alcohol use.  Never drive a vehicle or operate machinery while intoxicated.  

## 2016-09-08 NOTE — ED Provider Notes (Signed)
[facility] Emergency Department Provider Note  ____________________________________________   First MD Initiated Contact with Patient 09/08/16 1458     (approximate)  I have reviewed the triage vital signs and the nursing notes.   HISTORY  Chief Complaint Alcohol Intoxication  Level 5 caveat:  history/ROS limited by acute intoxication  HPI Kathryn Banks is a 61 y.o. female with a history of alcohol abuse who presents for evaluation of alcohol intoxication.She was brought by EMS after someone called due to concern that she drank an entire bottle of store bought moonshine.  She admits that she has been drinking today and "maybe" a bit more than usual.  She is clearly intoxicated at this time but awakens to light voice and light touch.  She is aware that she is in the hospital and is able to clearly verbalize that she was not attempting to harm herself.  She admits also to taking some oxycodone that she bought on the street along with alcohol.  She denies suicidal ideation and homicidal ideation.  She was surprised to learn that we felt she was still intoxicated because she states "I feel fine".  She denies any acute medical issues including fever/chills, chest pain, shortness of breath, nausea, vomiting, abdominal pain.  She definitively states that the moonshine she was drinking was bought at the [facility] store and was not homemade.   Past Medical History:  Diagnosis Date  . Acid reflux   . Depression   . ETOH abuse     There are no active problems to display for this patient.   Past Surgical History:  Procedure Laterality Date  . CESAREAN SECTION      Prior to Admission medications   Medication Sig Start Date End Date Taking? Authorizing Provider  albuterol-ipratropium (COMBIVENT) 18-103 MCG/ACT inhaler Inhale 2 puffs into the lungs every 6 (six) hours as needed for wheezing or shortness of breath. 09/01/15   Sharyn Creamer, MD  amoxicillin (AMOXIL) 500 MG  capsule Take 1 capsule (500 mg total) by mouth 3 (three) times daily. 04/20/16   Irean Hong, MD  azithromycin (ZITHROMAX) 250 MG tablet Take 1 tablet (250 mg total) by mouth daily. 04/20/16   Irean Hong, MD  buPROPion (WELLBUTRIN SR) 150 MG 12 hr tablet Take 150 mg by mouth 2 (two) times daily. 12/20/14   [provider]  omeprazole (PRILOSEC) 10 MG capsule Take 10 mg by mouth daily.    [provider]  predniSONE (DELTASONE) 20 MG tablet Take 2 tablets (40 mg total) by mouth daily with breakfast. 09/01/15   Sharyn Creamer, MD  sertraline (ZOLOFT) 100 MG tablet Take 150 mg by mouth daily. 08/01/15   [provider]    Allergies Patient has no known allergies.  Family History  Problem Relation Age of Onset  . Breast cancer Neg Hx     Social History Social History  Substance Use Topics  . Smoking status: Current Every Day Smoker    Packs/day: 2.00  . Smokeless tobacco: Not on file  . Alcohol use Yes     Comment: Daily -- beer and vodka    Review of Systems Level 5 caveat:  history/ROS limited by acute intoxication  ____________________________________________   PHYSICAL EXAM:  VITAL SIGNS: ED Triage Vitals [09/08/16 1350]  Enc Vitals Group     BP (!) 129/91     Pulse Rate 80     Resp (!) 22     Temp 97.5 F (36.4 C)  Temp Source Oral     SpO2 93 %     Weight 54.4 kg (120 lb)     Height 1.524 m (5')     Head Circumference      Peak Flow      Pain Score      Pain Loc      Pain Edu?      Excl. in GC?     Constitutional: Alert and oriented To person and place.  Appears intoxicated but is in no acute distress Eyes: Conjunctivae are normal. PERRL. EOMI. Head: Atraumatic. Nose: No congestion/rhinnorhea. Mouth/Throat: Mucous membranes are moist. Neck: No stridor.  No meningeal signs.   Cardiovascular: Normal rate, regular rhythm. Good peripheral circulation. Grossly normal heart sounds. Respiratory: Normal respiratory effort.  No retractions.  Lungs CTAB. Gastrointestinal: Soft and nontender. No distention.  Musculoskeletal: No lower extremity tenderness nor edema. No gross deformities of extremities. Neurologic:  Slurred speech and language. No other gross focal neurologic deficits are appreciated.  Skin:  Skin is warm, dry and intact. No rash noted. Psychiatric: Mood and affect are normal in spite of intoxication.  Denies suicidal ideation and homicidal ideation. ____________________________________________   LABS (all labs ordered are listed, but only abnormal results are displayed)  Labs Reviewed  COMPREHENSIVE METABOLIC PANEL - Abnormal; Notable for the following:       Result Value   Total Protein 8.8 (*)    AST 101 (*)    ALT 60 (*)    Alkaline Phosphatase 144 (*)    All other components within normal limits  ETHANOL - Abnormal; Notable for the following:    Alcohol, Ethyl (B) 359 (*)    All other components within normal limits  CBC - Abnormal; Notable for the following:    RDW 19.6 (*)    All other components within normal limits  URINE DRUG SCREEN, QUALITATIVE ([facility] ONLY) - Abnormal; Notable for the following:    Benzodiazepine, Ur Scrn POSITIVE (*)    All other components within normal limits   ____________________________________________  EKG  None - EKG not ordered by ED physician ____________________________________________  RADIOLOGY   No results found.  ____________________________________________   PROCEDURES  Critical Care performed: No   Procedure(s) performed:   Procedures   ____________________________________________   INITIAL IMPRESSION / ASSESSMENT AND PLAN / ED COURSE  Pertinent labs & imaging results that were available during my care of the patient were reviewed by me and considered in my medical decision making (see chart for details).  All the patient is quite intoxicated with an alcohol level greater than 350, she is oriented to person and place and definitively  denies any suicidal ideation and is very firm that she was "just drinking" (and taking oxycodone) and had no intention of harming herself.  She has been through detox before and did not indicate any desire or intention of wanting to stop drinking at this time.  She does not meet criteria for involuntary commitment at this time but she also does not have the capacity to make any decisions on her own while intoxicated.  She is willing to stay voluntarily in the emergency department in order to sober up and then we can reassess the plan but at this time I will not place her under involuntary commitment because she does not present an immediate danger to herself, but I cannot allow her to leave at this time because she could be a danger to herself.  She is comfortable waiting.  I have  put her on CIWA protocol given that she has a chronic alcoholic and may start showing signs of withdrawal while her ethanol level is still elevated.   Clinical Course as of Sep 09 2323  Mon Sep 08, 2016  2215 The patient is awake, alert, ambulatory without any difficulty and with a steady gait.  She is coherent and asking to go, reiterating that she has no suicidal ideation.  She has a sober adult coming to pick her up.  I gave my usual and customary return precautions.     [CF]    Clinical Course User Index [CF] Loleta Rose, MD    ____________________________________________  FINAL CLINICAL IMPRESSION(S) / ED DIAGNOSES  Final diagnoses:  Alcoholic intoxication without complication (HCC)  ETOH abuse     MEDICATIONS GIVEN DURING THIS VISIT:  Medications  LORazepam (ATIVAN) tablet 2 mg (2 mg Oral Given 09/08/16 1621)     NEW OUTPATIENT MEDICATIONS STARTED DURING THIS VISIT:  Discharge Medication List as of 09/08/2016 10:16 PM      Discharge Medication List as of 09/08/2016 10:16 PM      Discharge Medication List as of 09/08/2016 10:16 PM       Note:  This document was prepared using Dragon voice  recognition software and may include unintentional dictation errors.   Loleta Rose, MD 09/08/16 2325

## 2016-09-08 NOTE — ED Notes (Signed)
Pt woke up, walking steady, stating she is ready to be discharged.  Dr notified.  States she can call for a ride home.  Pt calling boyfriend for ride.

## 2016-09-08 NOTE — ED Notes (Signed)
Pt dressed out into appropriate behavioral health clothing. Pt belongings consist of a apir of blue short, a blue shirt and a pair of blue panties. Pt has a ring on left hand that is very hard to take off.

## 2016-09-08 NOTE — ED Notes (Signed)
Resumed care from Kenzley Ke t. rn   Pt sleeping. Easily aroused.

## 2016-09-08 NOTE — ED Notes (Signed)
BAC 359 - notified  rifenbark of value  -

## 2016-09-08 NOTE — ED Notes (Signed)
TTS was unable to assess at this time, as patient has been medicated and is unable to participate with the assessment.

## 2017-10-13 ENCOUNTER — Other Ambulatory Visit: Payer: Self-pay

## 2017-10-13 ENCOUNTER — Encounter
Admission: RE | Admit: 2017-10-13 | Discharge: 2017-10-13 | Disposition: A | Discharge status: Home / Self Care | Payer: Self-pay | Source: Ambulatory Visit | Attending: Orthopedic Surgery | Admitting: Orthopedic Surgery

## 2017-10-13 HISTORY — DX: Unspecified osteoarthritis, unspecified site: M19.90

## 2017-10-13 HISTORY — DX: Chronic obstructive pulmonary disease, unspecified: J44.9

## 2017-10-13 HISTORY — DX: Essential (primary) hypertension: I10

## 2017-10-13 HISTORY — DX: Inflammatory liver disease, unspecified: K75.9

## 2017-10-13 NOTE — Patient Instructions (Signed)
Your procedure is scheduled on: 10-15-17 THURSDAY Report to Same Day Surgery 2nd floor medical mall [facility](Medical Mall Entrance-take elevator on left to 2nd floor.  Check in with surgery information desk.) To find out your arrival time please call [phone] between 1PM - 3PM [facility]  Remember: Instructions that are not followed completely may result in serious medical risk, up to and including death, or upon the discretion of your surgeon and anesthesiologist your surgery may need to be rescheduled.    _x___ 1. Do not eat food after midnight the night before your procedure. NO GUM OR CANDY AFTER MIDNIGHT.  You may drink clear liquids up to 2 hours before you are scheduled to arrive at the hospital for your procedure.  Do not drink clear liquids within 2 hours of your scheduled arrival to the hospital.  Clear liquids include  --Water or Apple juice without pulp  --Clear carbohydrate beverage such as ClearFast or Gatorade  --Black Coffee or Clear Tea (No milk, no creamers, do not add anything to the coffee or Tea     __x__ 2. No Alcohol for 24 hours before or after surgery.   __x__3. No Smoking or e-cigarettes for 24 prior to surgery.  Do not use any chewable tobacco products for at least 6 hour prior to surgery   ____  4. Bring all medications with you on the day of surgery if instructed.    __x__ 5. Notify your doctor if there is any change in your medical condition     (cold, fever, infections).    x___6. On the morning of surgery brush your teeth with toothpaste and water.  You may rinse your mouth with mouth wash if you wish.  Do not swallow any toothpaste or mouthwash.   Do not wear jewelry, make-up, hairpins, clips or nail polish.  Do not wear lotions, powders, or perfumes. You may wear deodorant.  Do not shave 48 hours prior to surgery. Men may shave face and neck.  Do not bring valuables to the hospital.    [facility] not [facility] for any belongings or  valuables.               Contacts, dentures or bridgework may not be worn into surgery.  Leave your suitcase in the car. After surgery it may be brought to your room.  For patients admitted to the hospital, discharge time is determined by your treatment team.  _  Patients discharged the day of surgery will not be allowed to drive home.  You will need someone to drive you home and stay with you the night of your procedure.    Please read over the following fact sheets that you were given:   [facility]   _x___ TAKE THE FOLLOWING MEDICATION THE MORNING OF SURGERY WITH A SMALL SIP OF WATER. These include:  1. PRILOSEC (OMEPRAZOLE)  2. TAKE A PRILOSEC THE NIGHT BEFORE YOUR SURGERY  3. YOU MAY TAKE TRAMADOL IF NEEDED AM OF SURGERY  4.  5.  6.  ____Fleets enema or Magnesium Citrate as directed.   _x___ Use CHG Soap or sage wipes as directed on instruction sheet   _X___ Use inhalers on the day of surgery and bring to hospital day of surgery-USE YOUR COMBIVENT AT HOME DAY OF SURGERY AND BRING INHALER TO HOSPITAL   ____ Stop Metformin and Janumet 2 days prior to surgery.    ____ Take 1/2 of usual insulin dose the night before  surgery and none on the morning surgery.   ____ Follow recommendations from Cardiologist, Pulmonologist or PCP regarding stopping Aspirin, Coumadin, Plavix ,Eliquis, Effient, or Pradaxa, and Pletal.  X____Stop Anti-inflammatories such as Advil, Aleve, Ibuprofen, Motrin, Naproxen, Naprosyn, Goodies powders or aspirin products NOW-OK to take Tylenol    ____ Stop supplements until after surgery.     ____ Bring C-Pap to the hospital.

## 2017-10-14 ENCOUNTER — Encounter: Payer: Self-pay | Admitting: *Deleted

## 2017-10-14 ENCOUNTER — Encounter
Admission: RE | Admit: 2017-10-14 | Discharge: 2017-10-14 | Disposition: A | Discharge status: Home / Self Care | Payer: Self-pay | Source: Ambulatory Visit | Attending: Orthopedic Surgery | Admitting: Orthopedic Surgery

## 2017-10-14 DIAGNOSIS — J449 Chronic obstructive pulmonary disease, unspecified: Secondary | ICD-10-CM | POA: Insufficient documentation

## 2017-10-14 DIAGNOSIS — Z01810 Encounter for preprocedural cardiovascular examination: Secondary | ICD-10-CM | POA: Insufficient documentation

## 2017-10-14 DIAGNOSIS — Z01812 Encounter for preprocedural laboratory examination: Secondary | ICD-10-CM | POA: Insufficient documentation

## 2017-10-14 LAB — COMPREHENSIVE METABOLIC PANEL
ALBUMIN: 4.2 g/dL (ref 3.5–5.0)
ALT: 61 U/L — AB (ref 0–44)
AST: 93 U/L — ABNORMAL HIGH (ref 15–41)
Alkaline Phosphatase: 140 U/L — ABNORMAL HIGH (ref 38–126)
Anion gap: 10 (ref 5–15)
BUN: 10 mg/dL (ref 6–20)
CHLORIDE: 97 mmol/L — AB (ref 98–111)
CO2: 26 mmol/L (ref 22–32)
CREATININE: 0.73 mg/dL (ref 0.44–1.00)
Calcium: 9.2 mg/dL (ref 8.9–10.3)
GFR calc Af Amer: >60 mL/min (ref >60)
GFR calc non Af Amer: >60 mL/min (ref >60)
Glucose, Bld: 107 mg/dL — ABNORMAL HIGH (ref 70–99)
Potassium: 4.8 mmol/L (ref 3.5–5.1)
Sodium: 133 mmol/L — ABNORMAL LOW (ref 135–145)
Total Bilirubin: 1.3 mg/dL — ABNORMAL HIGH (ref 0.3–1.2)
Total Protein: 9 g/dL — ABNORMAL HIGH (ref 6.5–8.1)

## 2017-10-14 LAB — CBC
HCT: 42.9 % (ref 35.0–47.0)
Hemoglobin: 15.3 g/dL (ref 12.0–16.0)
MCH: 30.5 pg (ref 26.0–34.0)
MCHC: 35.6 g/dL (ref 32.0–36.0)
MCV: 85.6 fL (ref 80.0–100.0)
PLATELETS: 355 10*3/uL (ref 150–440)
RBC: 5.01 MIL/uL (ref 3.80–5.20)
RDW: 15.2 % — AB (ref 11.5–14.5)
WBC: 6.1 10*3/uL (ref 3.6–11.0)

## 2017-10-14 MED ORDER — CEFAZOLIN SODIUM-DEXTROSE 2-4 GM/100ML-% IV SOLN
2.0000 g | Freq: Once | INTRAVENOUS | Status: AC
Start: 2017-10-14 — End: 2017-10-15
  Administered 2017-10-15: 2 g via INTRAVENOUS

## 2017-10-15 ENCOUNTER — Ambulatory Visit: Payer: Self-pay | Admitting: Anesthesiology

## 2017-10-15 ENCOUNTER — Other Ambulatory Visit: Payer: Self-pay

## 2017-10-15 ENCOUNTER — Ambulatory Visit
Admission: RE | Admit: 2017-10-15 | Discharge: 2017-10-15 | Disposition: A | Discharge status: Home / Self Care | Payer: Self-pay | Source: Ambulatory Visit | Attending: Orthopedic Surgery | Admitting: Orthopedic Surgery

## 2017-10-15 ENCOUNTER — Encounter
Admission: RE | Disposition: A | Discharge status: Home / Self Care | Payer: Self-pay | Source: Ambulatory Visit | Attending: Orthopedic Surgery

## 2017-10-15 DIAGNOSIS — B1920 Unspecified viral hepatitis C without hepatic coma: Secondary | ICD-10-CM | POA: Insufficient documentation

## 2017-10-15 DIAGNOSIS — W010XXA Fall on same level from slipping, tripping and stumbling without subsequent striking against object, initial encounter: Secondary | ICD-10-CM | POA: Insufficient documentation

## 2017-10-15 DIAGNOSIS — F329 Major depressive disorder, single episode, unspecified: Secondary | ICD-10-CM | POA: Insufficient documentation

## 2017-10-15 DIAGNOSIS — M8589 Other specified disorders of bone density and structure, multiple sites: Secondary | ICD-10-CM | POA: Insufficient documentation

## 2017-10-15 DIAGNOSIS — Z8781 Personal history of (healed) traumatic fracture: Principal | ICD-10-CM

## 2017-10-15 DIAGNOSIS — M19049 Primary osteoarthritis, unspecified hand: Secondary | ICD-10-CM | POA: Insufficient documentation

## 2017-10-15 DIAGNOSIS — Z9889 Other specified postprocedural states: Secondary | ICD-10-CM

## 2017-10-15 DIAGNOSIS — S52591A Other fractures of lower end of right radius, initial encounter for closed fracture: Secondary | ICD-10-CM | POA: Insufficient documentation

## 2017-10-15 DIAGNOSIS — Z79899 Other long term (current) drug therapy: Secondary | ICD-10-CM | POA: Insufficient documentation

## 2017-10-15 DIAGNOSIS — I10 Essential (primary) hypertension: Secondary | ICD-10-CM | POA: Insufficient documentation

## 2017-10-15 DIAGNOSIS — J449 Chronic obstructive pulmonary disease, unspecified: Secondary | ICD-10-CM | POA: Insufficient documentation

## 2017-10-15 HISTORY — PX: OPEN REDUCTION INTERNAL FIXATION (ORIF) DISTAL RADIAL FRACTURE: SHX5989

## 2017-10-15 LAB — URINE DRUG SCREEN, QUALITATIVE (ARMC ONLY)
AMPHETAMINES, UR SCREEN: NOT DETECTED
Benzodiazepine, Ur Scrn: POSITIVE — AB
CANNABINOID 50 NG, UR ~~LOC~~: NOT DETECTED
Cocaine Metabolite,Ur ~~LOC~~: NOT DETECTED
MDMA (ECSTASY) UR SCREEN: NOT DETECTED
Methadone Scn, Ur: NOT DETECTED
OPIATE, UR SCREEN: NOT DETECTED
PHENCYCLIDINE (PCP) UR S: NOT DETECTED
Tricyclic, Ur Screen: NOT DETECTED

## 2017-10-15 SURGERY — OPEN REDUCTION INTERNAL FIXATION (ORIF) DISTAL RADIUS FRACTURE
Anesthesia: General | Laterality: Right

## 2017-10-15 MED ORDER — PROMETHAZINE HCL 25 MG/ML IJ SOLN: 6.2500 mg | INTRAMUSCULAR | Status: DC | PRN

## 2017-10-15 MED ORDER — FENTANYL CITRATE (PF) 100 MCG/2ML IJ SOLN
INTRAMUSCULAR | Status: AC
  Administered 2017-10-15: 25 ug via INTRAVENOUS
  Filled 2017-10-15: qty 2

## 2017-10-15 MED ORDER — LABETALOL HCL 5 MG/ML IV SOLN
INTRAVENOUS | Status: AC
  Administered 2017-10-15: 5 mg via INTRAVENOUS
  Filled 2017-10-15: qty 4

## 2017-10-15 MED ORDER — ACETAMINOPHEN 10 MG/ML IV SOLN
INTRAVENOUS | Status: AC
  Filled 2017-10-15: qty 100

## 2017-10-15 MED ORDER — MIDAZOLAM HCL 2 MG/2ML IJ SOLN
INTRAMUSCULAR | Status: DC | PRN
  Administered 2017-10-15: 2 mg via INTRAVENOUS

## 2017-10-15 MED ORDER — PROPOFOL 10 MG/ML IV BOLUS
INTRAVENOUS | Status: DC | PRN
  Administered 2017-10-15: 200 mg via INTRAVENOUS

## 2017-10-15 MED ORDER — ONDANSETRON HCL 4 MG PO TABS
4.0000 mg | ORAL_TABLET | Freq: Four times a day (QID) | ORAL | Status: DC | PRN
Start: 2017-10-15 — End: 2017-10-15

## 2017-10-15 MED ORDER — SODIUM CHLORIDE 0.9 % IV SOLN: INTRAVENOUS | Status: DC

## 2017-10-15 MED ORDER — CEFAZOLIN SODIUM-DEXTROSE 2-4 GM/100ML-% IV SOLN
INTRAVENOUS | Status: AC
  Filled 2017-10-15: qty 100

## 2017-10-15 MED ORDER — METOCLOPRAMIDE HCL 5 MG/ML IJ SOLN: 5.0000 mg | Freq: Three times a day (TID) | INTRAMUSCULAR | Status: DC | PRN

## 2017-10-15 MED ORDER — TRAMADOL HCL 50 MG PO TABS: 100.0000 mg | ORAL_TABLET | Freq: Four times a day (QID) | ORAL | 1 refills | Status: DC | PRN

## 2017-10-15 MED ORDER — MIDAZOLAM HCL 2 MG/2ML IJ SOLN
INTRAMUSCULAR | Status: AC
  Filled 2017-10-15: qty 2

## 2017-10-15 MED ORDER — FENTANYL CITRATE (PF) 100 MCG/2ML IJ SOLN
INTRAMUSCULAR | Status: DC | PRN
  Administered 2017-10-15 (×6): 50 ug via INTRAVENOUS

## 2017-10-15 MED ORDER — METOCLOPRAMIDE HCL 10 MG PO TABS: 5.0000 mg | ORAL_TABLET | Freq: Three times a day (TID) | ORAL | Status: DC | PRN

## 2017-10-15 MED ORDER — ONDANSETRON HCL 4 MG/2ML IJ SOLN
INTRAMUSCULAR | Status: DC | PRN
  Administered 2017-10-15: 4 mg via INTRAVENOUS

## 2017-10-15 MED ORDER — ONDANSETRON HCL 4 MG/2ML IJ SOLN
4.0000 mg | Freq: Four times a day (QID) | INTRAMUSCULAR | Status: DC | PRN
Start: 2017-10-15 — End: 2017-10-15

## 2017-10-15 MED ORDER — TRAMADOL HCL 50 MG PO TABS
100.0000 mg | ORAL_TABLET | Freq: Four times a day (QID) | ORAL | Status: DC | PRN
  Administered 2017-10-15: 100 mg via ORAL
  Filled 2017-10-15: qty 2

## 2017-10-15 MED ORDER — ONDANSETRON HCL 4 MG/2ML IJ SOLN
INTRAMUSCULAR | Status: AC
  Filled 2017-10-15: qty 2

## 2017-10-15 MED ORDER — DEXAMETHASONE SODIUM PHOSPHATE 10 MG/ML IJ SOLN
INTRAMUSCULAR | Status: AC
  Filled 2017-10-15: qty 1

## 2017-10-15 MED ORDER — ACETAMINOPHEN 10 MG/ML IV SOLN
INTRAVENOUS | Status: DC | PRN
  Administered 2017-10-15: 1000 mg via INTRAVENOUS

## 2017-10-15 MED ORDER — FENTANYL CITRATE (PF) 100 MCG/2ML IJ SOLN
25.0000 ug | INTRAMUSCULAR | Status: AC | PRN
  Administered 2017-10-15 (×6): 25 ug via INTRAVENOUS

## 2017-10-15 MED ORDER — FENTANYL CITRATE (PF) 100 MCG/2ML IJ SOLN
INTRAMUSCULAR | Status: AC
  Filled 2017-10-15: qty 2

## 2017-10-15 MED ORDER — DEXAMETHASONE SODIUM PHOSPHATE 10 MG/ML IJ SOLN
INTRAMUSCULAR | Status: DC | PRN
  Administered 2017-10-15: 5 mg via INTRAVENOUS

## 2017-10-15 MED ORDER — LACTATED RINGERS IV SOLN
INTRAVENOUS | Status: DC
  Administered 2017-10-15 (×2): via INTRAVENOUS

## 2017-10-15 MED ORDER — TRAMADOL HCL 50 MG PO TABS
ORAL_TABLET | ORAL | Status: AC
  Filled 2017-10-15: qty 2

## 2017-10-15 MED ORDER — HYDRALAZINE HCL 20 MG/ML IJ SOLN
INTRAMUSCULAR | Status: AC
  Administered 2017-10-15: 5 mg via INTRAVENOUS
  Filled 2017-10-15: qty 1

## 2017-10-15 MED ORDER — LABETALOL HCL 5 MG/ML IV SOLN
5.0000 mg | INTRAVENOUS | Status: AC | PRN
  Administered 2017-10-15 (×4): 5 mg via INTRAVENOUS

## 2017-10-15 MED ORDER — HYDRALAZINE HCL 20 MG/ML IJ SOLN
5.0000 mg | Freq: Once | INTRAMUSCULAR | Status: AC
  Administered 2017-10-15: 5 mg via INTRAVENOUS

## 2017-10-15 MED ORDER — NEOMYCIN-POLYMYXIN B GU 40-200000 IR SOLN
Status: DC | PRN
  Administered 2017-10-15: 2 mL

## 2017-10-15 MED ORDER — PROPOFOL 10 MG/ML IV BOLUS
INTRAVENOUS | Status: AC
  Filled 2017-10-15: qty 20

## 2017-10-15 SURGICAL SUPPLY — 41 items
BANDAGE ACE 4X5 VEL STRL LF (GAUZE/BANDAGES/DRESSINGS) ×3 IMPLANT
BIT DRILL 2.5X4 QC (BIT) ×2 IMPLANT
CANISTER SUCT 1200ML W/VALVE (MISCELLANEOUS) ×3 IMPLANT
CHLORAPREP W/TINT 26ML (MISCELLANEOUS) ×3 IMPLANT
CUFF TOURN 18 STER (MISCELLANEOUS) ×2 IMPLANT
DRAPE FLUOR MINI C-ARM 54X84 (DRAPES) ×3 IMPLANT
DRIVER PEG 2.0 FAST (BIT) ×2 IMPLANT
ELECT REM PT RETURN 9FT ADLT (ELECTROSURGICAL) ×3
ELECTRODE REM PT RTRN 9FT ADLT (ELECTROSURGICAL) ×1 IMPLANT
GAUZE PETRO XEROFOAM 1X8 (MISCELLANEOUS) ×6 IMPLANT
GAUZE SPONGE 4X4 12PLY STRL (GAUZE/BANDAGES/DRESSINGS) ×3 IMPLANT
GLOVE SURG SYN 9.0  PF PI (GLOVE) ×2
GLOVE SURG SYN 9.0 PF PI (GLOVE) ×1 IMPLANT
GOWN SRG 2XL LVL 4 RGLN SLV (GOWNS) ×1 IMPLANT
GOWN STRL NON-REIN 2XL LVL4 (GOWNS) ×2
GOWN STRL REUS W/ TWL LRG LVL3 (GOWN DISPOSABLE) ×1 IMPLANT
GOWN STRL REUS W/TWL LRG LVL3 (GOWN DISPOSABLE) ×2
K-WIRE 1.6 (WIRE) ×4
K-WIRE FX5X1.6XNS BN SS (WIRE) ×2
KIT TURNOVER KIT A (KITS) ×3 IMPLANT
KWIRE FX5X1.6XNS BN SS (WIRE) IMPLANT
NDL FILTER BLUNT 18X1 1/2 (NEEDLE) ×1 IMPLANT
NEEDLE FILTER BLUNT 18X 1/2SAF (NEEDLE) ×2
NEEDLE FILTER BLUNT 18X1 1/2 (NEEDLE) ×1 IMPLANT
NS IRRIG 500ML POUR BTL (IV SOLUTION) ×3 IMPLANT
PACK EXTREMITY ARMC (MISCELLANEOUS) ×3 IMPLANT
PAD CAST CTTN 4X4 STRL (SOFTGOODS) ×2 IMPLANT
PADDING CAST COTTON 4X4 STRL (SOFTGOODS) ×4
PEG SUBCHONDRAL SMOOTH 2.0X14 (Peg) ×2 IMPLANT
PEG SUBCHONDRAL SMOOTH 2.0X18 (Peg) ×2 IMPLANT
PEG SUBCHONDRAL SMOOTH 2.0X20 (Peg) ×8 IMPLANT
PEG SUBCHONDRAL SMOOTH 2.0X22 (Peg) IMPLANT
PLATE STAN 21.6X57.2 NRRW RT (Plate) ×2 IMPLANT
SCALPEL PROTECTED #15 DISP (BLADE) ×6 IMPLANT
SCREW CORT 3.5X10 LNG (Screw) ×6 IMPLANT
SPLINT CAST 1 STEP 3X12 (MISCELLANEOUS) ×3 IMPLANT
SUT ETHILON 4-0 (SUTURE) ×2
SUT ETHILON 4-0 FS2 18XMFL BLK (SUTURE) ×1
SUT VICRYL 3-0 27IN (SUTURE) ×3 IMPLANT
SUTURE ETHLN 4-0 FS2 18XMF BLK (SUTURE) ×1 IMPLANT
SYR 3ML LL SCALE MARK (SYRINGE) ×3 IMPLANT

## 2017-10-15 NOTE — Discharge Instructions (Addendum)
Keep arm elevated as much as possible.  Work on finger range of motion.  Pain medicine as directed.  Ice to back of hand and wrist today and tomorrow    AMBULATORY SURGERY  DISCHARGE INSTRUCTIONS   1) The drugs that you were given will stay in your system until tomorrow so for the next 24 hours you should not:  A) Drive an automobile B) Make any legal decisions C) Drink any alcoholic beverage   2) You may resume regular meals tomorrow.  Today it is better to start with liquids and gradually work up to solid foods.  You may eat anything you prefer, but it is better to start with liquids, then soup and crackers, and gradually work up to solid foods.   3) Please notify your doctor immediately if you have any unusual bleeding, trouble breathing, redness and pain at the surgery site, drainage, fever, or pain not relieved by medication.    4) Additional Instructions:        Please contact your physician with any problems or Same Day Surgery at [phone], Monday through Friday 6 am to 4 pm, or [place] at [facility] Main number at [phone].

## 2017-10-15 NOTE — Anesthesia Procedure Notes (Signed)
Procedure Name: LMA Insertion Date/Time: 10/15/2017 12:05 PM Performed by: Omer JackWeatherly, Meilin Brosh, CRNA Pre-anesthesia Checklist: Patient identified, Patient being monitored, Timeout performed, Emergency Drugs available and Suction available Patient Re-evaluated:Patient Re-evaluated prior to induction Oxygen Delivery Method: Circle system utilized Preoxygenation: Pre-oxygenation with 100% oxygen Induction Type: IV induction Ventilation: Mask ventilation without difficulty LMA: LMA inserted LMA Size: 3.5 Tube type: Oral Number of attempts: 1 Placement Confirmation: positive ETCO2 and breath sounds checked- equal and bilateral Tube secured with: Tape Dental Injury: Teeth and Oropharynx as per pre-operative assessment

## 2017-10-15 NOTE — Anesthesia Post-op Follow-up Note (Signed)
Anesthesia QCDR form completed.        

## 2017-10-15 NOTE — Transfer of Care (Signed)
Immediate Anesthesia Transfer of Care Note  Patient: Kathryn Banks  Procedure(s) Performed: OPEN REDUCTION INTERNAL FIXATION (ORIF) DISTAL RADIAL FRACTURE (Right )  Patient Location: PACU  Anesthesia Type:General  Level of Consciousness: awake, alert  and oriented  Airway & Oxygen Therapy: Patient Spontanous Breathing and Patient connected to face mask oxygen  Post-op Assessment: Report given to RN and Post -op Vital signs reviewed and stable  Post vital signs: Reviewed and stable  Last Vitals:  Vitals Value Taken Time  BP 190/97 10/15/2017  1:14 PM  Temp    Pulse 83 10/15/2017  1:14 PM  Resp 13 10/15/2017  1:14 PM  SpO2 97 % 10/15/2017  1:14 PM  Vitals shown include unvalidated device data.  Last Pain:  Vitals:   10/15/17 0915  TempSrc: Temporal  PainSc: 10-Worst pain ever         Complications: No apparent anesthesia complications

## 2017-10-15 NOTE — Op Note (Signed)
10/15/2017  1:11 PM  PATIENT:  Kathryn Banks  62 y.o. female  PRE-OPERATIVE DIAGNOSIS:  closed traumatic displaced fracture of distal end of right radius, 2 parts  POST-OPERATIVE DIAGNOSIS:  closed traumatic displaced fracture of distal end of right radius, same  PROCEDURE:  Procedure(s): OPEN REDUCTION INTERNAL FIXATION (ORIF) DISTAL RADIAL FRACTURE (Right)  SURGEON: Leitha SchullerMichael J Crystalle Popwell, MD  ASSISTANTS: None  ANESTHESIA:   general  EBL:  Total I/O In: 1200 [I.V.:1200] Out: 2 [Blood:2]  BLOOD ADMINISTERED:none  DRAINS: none   LOCAL MEDICATIONS USED:  NONE  SPECIMEN:  No Specimen  DISPOSITION OF SPECIMEN:  N/A  COUNTS:  YES  TOURNIQUET: 24 minutes and 250 mmHg  IMPLANTS: [employer] hand innovations short narrow DVR plate with multiple pegs and screws  DICTATION: .Dragon Dictation patient brought the operating room and after adequate anesthesia was obtained, the right arm was prepped review sterile fashion.  After patient identification and timeout procedures were completed, tourniquet was raised.  Approach was made centered over the FCR tendon tendon sheath incised and the tendon retracted radially to protect the radial artery and veins.  The pronator was partly torn off the distal radius fracture was elevated off the shaft with a finger trap traction having been applied length was nearly restored with a Freer elevator spica bone from the distal fragment to be brought forward and and interdigitation to restore length anatomic position.  The volar tilt was restored by applying pressure.  Short narrow DVR plate was then applied and 2 K wires used to hold it in position while distal smooth pegs were placed followed by 310 mm traction was released and under fluoroscopic views there is no penetration into the joint there is essentially anatomic alignment.  The wound was thoroughly irrigated and tourniquet let down.  Skin was closed with 3-0 Vicryl subcutaneously 4-0 nylon simple interrupted  sutures covered by Xeroform, 4 x 4's web roll and volar splint followed by an Ace wrap.  PLAN OF CARE: Discharge to home after PACU  PATIENT DISPOSITION:  PACU - hemodynamically stable.

## 2017-10-15 NOTE — Anesthesia Preprocedure Evaluation (Signed)
Anesthesia Evaluation  Patient identified by MRN, date of birth, ID band Patient awake    Reviewed: Allergy & Precautions, H&P , NPO status , Patient's Chart, lab work & pertinent test results, reviewed documented beta blocker date and time   Airway Mallampati: I  TM Distance: >3 FB Neck ROM: full    Dental  (+) Dental Advidsory Given, Edentulous Upper, Missing, Poor Dentition   Pulmonary neg pulmonary ROS, neg shortness of breath, neg sleep apnea, COPD,  COPD inhaler, neg recent URI, Current Smoker,           Cardiovascular Exercise Tolerance: Good hypertension, (-) angina(-) CAD, (-) Past MI, (-) Cardiac Stents and (-) CABG (-) dysrhythmias (-) Valvular Problems/Murmurs     Neuro/Psych PSYCHIATRIC DISORDERS Depression negative neurological ROS     GI/Hepatic Neg liver ROS, GERD  ,(+) Hepatitis -, C  Endo/Other  negative endocrine ROS  Renal/GU negative Renal ROS  negative genitourinary   Musculoskeletal   Abdominal   Peds  Hematology negative hematology ROS (+)   Anesthesia Other Findings Past Medical History: No date: Acid reflux No date: Arthritis     Comment:  FINGERS No date: COPD (chronic obstructive pulmonary disease) (HCC) No date: Depression No date: ETOH abuse No date: Hepatitis     Comment:  C-PT STATED SHE WAS TOLD THIS 2014 No date: Hypertension     Comment:  OFF MEDS FOR FEW YEARS   Reproductive/Obstetrics negative OB ROS                             Anesthesia Physical Anesthesia Plan  ASA: III  Anesthesia Plan: General   Post-op Pain Management:    Induction: Intravenous  PONV Risk Score and Plan: 2 and Ondansetron and Dexamethasone  Airway Management Planned: LMA  Additional Equipment:   Intra-op Plan:   Post-operative Plan: Extubation in OR  Informed Consent: I have reviewed the patients History and Physical, chart, labs and discussed the procedure  including the risks, benefits and alternatives for the proposed anesthesia with the patient or authorized representative who has indicated his/her understanding and acceptance.   Dental Advisory Given  Plan Discussed with: Anesthesiologist, CRNA and Surgeon  Anesthesia Plan Comments:         Anesthesia Quick Evaluation

## 2017-10-15 NOTE — H&P (Signed)
Reviewed paper H+P, will be scanned into chart. No changes noted.  

## 2017-10-16 ENCOUNTER — Encounter: Payer: Self-pay | Admitting: Orthopedic Surgery

## 2017-10-17 NOTE — Anesthesia Postprocedure Evaluation (Signed)
Anesthesia Post Note  Patient: Kathryn Banks  Procedure(s) Performed: OPEN REDUCTION INTERNAL FIXATION (ORIF) DISTAL RADIAL FRACTURE (Right )  Patient location during evaluation: PACU Anesthesia Type: General Level of consciousness: awake and alert Pain management: pain level controlled Vital Signs Assessment: post-procedure vital signs reviewed and stable Respiratory status: spontaneous breathing, nonlabored ventilation, respiratory function stable and patient connected to nasal cannula oxygen Cardiovascular status: blood pressure returned to baseline and stable Postop Assessment: no apparent nausea or vomiting Anesthetic complications: no     Last Vitals:  Vitals:   10/15/17 1523 10/15/17 1540  BP: (!) 145/92 (!) 151/93  Pulse: 76 76  Resp: 16 16  Temp: 37.1 C   SpO2: 92% 94%    Last Pain:  Vitals:   10/16/17 1035  TempSrc:   PainSc: 0-No pain                 Lenard SimmerAndrew Marion Seese

## 2019-01-31 ENCOUNTER — Other Ambulatory Visit: Payer: Self-pay

## 2019-01-31 ENCOUNTER — Emergency Department (HOSPITAL_COMMUNITY): Payer: Self-pay

## 2019-01-31 ENCOUNTER — Encounter (HOSPITAL_COMMUNITY): Payer: Self-pay | Admitting: Internal Medicine

## 2019-01-31 ENCOUNTER — Observation Stay (HOSPITAL_COMMUNITY)
Admission: EM | Admit: 2019-01-31 | Discharge: 2019-02-01 | Disposition: A | Discharge status: Home / Self Care | Payer: Self-pay | Attending: Family Medicine | Admitting: Family Medicine

## 2019-01-31 DIAGNOSIS — J449 Chronic obstructive pulmonary disease, unspecified: Secondary | ICD-10-CM | POA: Insufficient documentation

## 2019-01-31 DIAGNOSIS — Z20828 Contact with and (suspected) exposure to other viral communicable diseases: Secondary | ICD-10-CM | POA: Insufficient documentation

## 2019-01-31 DIAGNOSIS — F17210 Nicotine dependence, cigarettes, uncomplicated: Secondary | ICD-10-CM | POA: Insufficient documentation

## 2019-01-31 DIAGNOSIS — F329 Major depressive disorder, single episode, unspecified: Secondary | ICD-10-CM | POA: Insufficient documentation

## 2019-01-31 DIAGNOSIS — I10 Essential (primary) hypertension: Secondary | ICD-10-CM

## 2019-01-31 DIAGNOSIS — E038 Other specified hypothyroidism: Secondary | ICD-10-CM

## 2019-01-31 DIAGNOSIS — J441 Chronic obstructive pulmonary disease with (acute) exacerbation: Secondary | ICD-10-CM | POA: Diagnosis present

## 2019-01-31 DIAGNOSIS — K7460 Unspecified cirrhosis of liver: Secondary | ICD-10-CM | POA: Insufficient documentation

## 2019-01-31 DIAGNOSIS — R918 Other nonspecific abnormal finding of lung field: Secondary | ICD-10-CM

## 2019-01-31 DIAGNOSIS — I700 Atherosclerosis of aorta: Secondary | ICD-10-CM | POA: Insufficient documentation

## 2019-01-31 DIAGNOSIS — R079 Chest pain, unspecified: Secondary | ICD-10-CM | POA: Diagnosis present

## 2019-01-31 DIAGNOSIS — M1990 Unspecified osteoarthritis, unspecified site: Secondary | ICD-10-CM | POA: Insufficient documentation

## 2019-01-31 DIAGNOSIS — I160 Hypertensive urgency: Secondary | ICD-10-CM

## 2019-01-31 DIAGNOSIS — F1911 Other psychoactive substance abuse, in remission: Secondary | ICD-10-CM | POA: Diagnosis present

## 2019-01-31 DIAGNOSIS — F17200 Nicotine dependence, unspecified, uncomplicated: Secondary | ICD-10-CM

## 2019-01-31 DIAGNOSIS — Z79899 Other long term (current) drug therapy: Secondary | ICD-10-CM | POA: Insufficient documentation

## 2019-01-31 DIAGNOSIS — Z8249 Family history of ischemic heart disease and other diseases of the circulatory system: Secondary | ICD-10-CM | POA: Insufficient documentation

## 2019-01-31 DIAGNOSIS — E039 Hypothyroidism, unspecified: Secondary | ICD-10-CM | POA: Insufficient documentation

## 2019-01-31 DIAGNOSIS — K219 Gastro-esophageal reflux disease without esophagitis: Secondary | ICD-10-CM | POA: Insufficient documentation

## 2019-01-31 DIAGNOSIS — R072 Precordial pain: Principal | ICD-10-CM

## 2019-01-31 LAB — CBC
HCT: 46.5 % — ABNORMAL HIGH (ref 36.0–46.0)
Hemoglobin: 15.8 g/dL — ABNORMAL HIGH (ref 12.0–15.0)
MCH: 30.9 pg (ref 26.0–34.0)
MCHC: 34 g/dL (ref 30.0–36.0)
MCV: 90.8 fL (ref 80.0–100.0)
Platelets: 358 10*3/uL (ref 150–400)
RBC: 5.12 MIL/uL — ABNORMAL HIGH (ref 3.87–5.11)
RDW: 13.9 % (ref 11.5–15.5)
WBC: 8.9 10*3/uL (ref 4.0–10.5)
nRBC: 0 % (ref 0.0–0.2)

## 2019-01-31 LAB — BASIC METABOLIC PANEL
Anion gap: 12 (ref 5–15)
BUN: 14 mg/dL (ref 8–23)
CO2: 20 mmol/L — ABNORMAL LOW (ref 22–32)
Calcium: 9 mg/dL (ref 8.9–10.3)
Chloride: 100 mmol/L (ref 98–111)
Creatinine, Ser: 0.91 mg/dL (ref 0.44–1.00)
GFR calc Af Amer: >60 mL/min (ref >60)
GFR calc non Af Amer: >60 mL/min (ref >60)
Glucose, Bld: 108 mg/dL — ABNORMAL HIGH (ref 70–99)
Potassium: 4.5 mmol/L (ref 3.5–5.1)
Sodium: 132 mmol/L — ABNORMAL LOW (ref 135–145)

## 2019-01-31 LAB — LIPASE, BLOOD: Lipase: 32 U/L (ref 11–51)

## 2019-01-31 LAB — HEPATIC FUNCTION PANEL
ALT: 64 U/L — ABNORMAL HIGH (ref 0–44)
AST: 72 U/L — ABNORMAL HIGH (ref 15–41)
Albumin: 4 g/dL (ref 3.5–5.0)
Alkaline Phosphatase: 113 U/L (ref 38–126)
Bilirubin, Direct: 0.3 mg/dL — ABNORMAL HIGH (ref 0.0–0.2)
Indirect Bilirubin: 0.9 mg/dL (ref 0.3–0.9)
Total Bilirubin: 1.2 mg/dL (ref 0.3–1.2)
Total Protein: 8.4 g/dL — ABNORMAL HIGH (ref 6.5–8.1)

## 2019-01-31 LAB — TROPONIN I (HIGH SENSITIVITY)
Troponin I (High Sensitivity): 6 ng/L (ref <18)
Troponin I (High Sensitivity): 5 ng/L (ref <18)

## 2019-01-31 LAB — HEMOGLOBIN A1C
Hgb A1c MFr Bld: 5.5 % (ref 4.8–5.6)
Mean Plasma Glucose: 111.15 mg/dL

## 2019-01-31 LAB — RAPID URINE DRUG SCREEN, HOSP PERFORMED
Amphetamines: NOT DETECTED
Barbiturates: NOT DETECTED
Benzodiazepines: NOT DETECTED
Cocaine: NOT DETECTED
Opiates: POSITIVE — AB
Tetrahydrocannabinol: NOT DETECTED

## 2019-01-31 LAB — HIV ANTIBODY (ROUTINE TESTING W REFLEX): HIV Screen 4th Generation wRfx: NONREACTIVE

## 2019-01-31 LAB — TSH: TSH: 7.716 u[IU]/mL — ABNORMAL HIGH (ref 0.350–4.500)

## 2019-01-31 LAB — SARS CORONAVIRUS 2 (TAT 6-24 HRS): SARS Coronavirus 2: NEGATIVE

## 2019-01-31 MED ORDER — NICOTINE 21 MG/24HR TD PT24
21.0000 mg | MEDICATED_PATCH | Freq: Every day | TRANSDERMAL | Status: DC
  Administered 2019-01-31 – 2019-02-01 (×2): 21 mg via TRANSDERMAL
  Filled 2019-01-31 (×2): qty 1

## 2019-01-31 MED ORDER — NAPROXEN 250 MG PO TABS
500.0000 mg | ORAL_TABLET | Freq: Every day | ORAL | Status: DC
  Administered 2019-01-31: 500 mg via ORAL
  Filled 2019-01-31 (×2): qty 2

## 2019-01-31 MED ORDER — METOPROLOL TARTRATE 25 MG PO TABS
12.5000 mg | ORAL_TABLET | Freq: Two times a day (BID) | ORAL | Status: DC
  Administered 2019-01-31 – 2019-02-01 (×2): 12.5 mg via ORAL
  Filled 2019-01-31 (×2): qty 1

## 2019-01-31 MED ORDER — LABETALOL HCL 5 MG/ML IV SOLN
10.0000 mg | Freq: Once | INTRAVENOUS | Status: AC
  Administered 2019-01-31: 10 mg via INTRAVENOUS
  Filled 2019-01-31: qty 4

## 2019-01-31 MED ORDER — NAPROXEN SOD-DIPHENHYDRAMINE 220-25 MG PO TABS: 2.0000 | ORAL_TABLET | Freq: Every day | ORAL | Status: DC

## 2019-01-31 MED ORDER — HYDROMORPHONE HCL 1 MG/ML IJ SOLN
0.5000 mg | Freq: Once | INTRAMUSCULAR | Status: AC
  Administered 2019-01-31: 0.5 mg via INTRAVENOUS
  Filled 2019-01-31: qty 1

## 2019-01-31 MED ORDER — IOHEXOL 350 MG/ML SOLN
100.0000 mL | Freq: Once | INTRAVENOUS | Status: AC | PRN
  Administered 2019-01-31: 100 mL via INTRAVENOUS

## 2019-01-31 MED ORDER — PANTOPRAZOLE SODIUM 40 MG PO TBEC
40.0000 mg | DELAYED_RELEASE_TABLET | Freq: Every day | ORAL | Status: DC
  Administered 2019-01-31 – 2019-02-01 (×2): 40 mg via ORAL
  Filled 2019-01-31 (×2): qty 1

## 2019-01-31 MED ORDER — ONDANSETRON HCL 4 MG/2ML IJ SOLN
4.0000 mg | Freq: Once | INTRAMUSCULAR | Status: AC
  Administered 2019-01-31: 4 mg via INTRAVENOUS
  Filled 2019-01-31: qty 2

## 2019-01-31 MED ORDER — SODIUM CHLORIDE 0.9% FLUSH: 3.0000 mL | Freq: Once | INTRAVENOUS | Status: DC

## 2019-01-31 MED ORDER — DIPHENHYDRAMINE HCL 25 MG PO CAPS
50.0000 mg | ORAL_CAPSULE | Freq: Every day | ORAL | Status: DC
  Administered 2019-01-31: 50 mg via ORAL
  Filled 2019-01-31: qty 2

## 2019-01-31 MED ORDER — METOPROLOL TARTRATE 25 MG PO TABS: 25.0000 mg | ORAL_TABLET | Freq: Two times a day (BID) | ORAL | Status: DC

## 2019-01-31 MED ORDER — ENOXAPARIN SODIUM 40 MG/0.4ML ~~LOC~~ SOLN
40.0000 mg | SUBCUTANEOUS | Status: DC
  Administered 2019-01-31: 40 mg via SUBCUTANEOUS
  Filled 2019-01-31: qty 0.4

## 2019-01-31 MED ORDER — HYDRALAZINE HCL 20 MG/ML IJ SOLN: 5.0000 mg | INTRAMUSCULAR | Status: DC | PRN

## 2019-01-31 MED ORDER — ATORVASTATIN CALCIUM 10 MG PO TABS
20.0000 mg | ORAL_TABLET | Freq: Every day | ORAL | Status: DC
  Administered 2019-01-31: 20 mg via ORAL
  Filled 2019-01-31: qty 2

## 2019-01-31 MED ORDER — ASPIRIN EC 81 MG PO TBEC
81.0000 mg | DELAYED_RELEASE_TABLET | Freq: Every day | ORAL | Status: DC
  Administered 2019-02-01: 81 mg via ORAL
  Filled 2019-01-31: qty 1

## 2019-01-31 MED ORDER — ACETAMINOPHEN 325 MG PO TABS: 650.0000 mg | ORAL_TABLET | ORAL | Status: DC | PRN

## 2019-01-31 MED ORDER — IPRATROPIUM-ALBUTEROL 0.5-2.5 (3) MG/3ML IN SOLN: 3.0000 mL | Freq: Four times a day (QID) | RESPIRATORY_TRACT | Status: DC | PRN

## 2019-01-31 MED ORDER — ONDANSETRON HCL 4 MG/2ML IJ SOLN: 4.0000 mg | Freq: Four times a day (QID) | INTRAMUSCULAR | Status: DC | PRN

## 2019-01-31 NOTE — H&P (Signed)
History and Physical    Kathryn Banks ZWC:[id number] DOB: 1955/10/02 DOA: 01/31/2019  PCP: Sharyne Peach, MD Consultants:  Rudene Christians - orthopedics Patient coming from:  Home - lives with mother or boyfriend; NOK: Mother, [phone]  Chief Complaint: chest pain  HPI: Kathryn Banks is a 63 y.o. female with medical history significant of HTN; ETOH abuse; depression; and COPD presenting with chest pain.  She developed acute onset of severe CP this AM.  She was sitting on the couch watching TV when it started.  Pain was left-sided.  She went to work and was sent to the ER.  Pain was around her left breast and in her back "right behind my heart in that area right there."  Her left hand was a little numb.  It is still present.  Her BP was markedly elevated upon arrival.  The pain is somewhat better - possibly with pain medicine or BP medication.  She also feels more calm, not breathing as hard.  No prior h/o CAD.   ED Course: chest pain - needs r/o.  Severe pain today with radiation into L arm.  Uncontrolled BP on arrival.  CTA negative for dissection, ?PNA at right base (doesn't fit clinical scenario).  Several doses of pain medication. Review of Systems: As per HPI; otherwise review of systems reviewed and negative.   Ambulatory Status:  Ambulates without assistance  Past Medical History:  Diagnosis Date   Acid reflux    Arthritis    FINGERS   COPD (chronic obstructive pulmonary disease) ([place])    Depression    ETOH abuse    Hepatitis    C-PT STATED SHE WAS TOLD THIS 2014   Hypertension    OFF MEDS FOR FEW YEARS    Past Surgical History:  Procedure Laterality Date   CESAREAN SECTION     OPEN REDUCTION INTERNAL FIXATION (ORIF) DISTAL RADIAL FRACTURE Right 10/15/2017   Procedure: OPEN REDUCTION INTERNAL FIXATION (ORIF) DISTAL RADIAL FRACTURE;  Surgeon: Hessie Knows, MD;  Location: [facility] ORS;  Service: Orthopedics;  Laterality: Right;    Social History   Socioeconomic History     Marital status: Single    Spouse name: Not on file   Number of children: Not on file   Years of education: Not on file   Highest education level: Not on file  Occupational History   Occupation: Scientist, forensic  Social Needs   Financial resource strain: Not on file   Food insecurity    Worry: Not on file    Inability: Not on file   Transportation needs    Medical: Not on file    Non-medical: Not on file  Tobacco Use   Smoking status: Current Every Day Smoker    Packs/day: 1.00    Years: 45.00    Pack years: 45.00    Types: Cigarettes   Smokeless tobacco: Never Used  Substance and Sexual Activity   Alcohol use: Yes    Comment: OCC -- beer and vodka; 1-2 every few days   Drug use: Not Currently    Types: Opium, Benzodiazepines, Heroin    Comment: remote history, quit about 1985   Sexual activity: Yes  Lifestyle   Physical activity    Days per week: Not on file    Minutes per session: Not on file   Stress: Not on file  Relationships   Social connections    Talks on phone: Not on file    Gets together: Not on file    Attends  religious service: Not on file    Active member of club or organization: Not on file    Attends meetings of clubs or organizations: Not on file    Relationship status: Not on file   Intimate partner violence    Fear of current or ex partner: Not on file    Emotionally abused: Not on file    Physically abused: Not on file    Forced sexual activity: Not on file  Other Topics Concern   Not on file  Social History Narrative   Not on file    No Known Allergies  Family History  Problem Relation Age of Onset   Ulcers Mother        Recent bleeding ulcers requiring transfusion, 2020   CAD Father 70   CVA Sister 71   Breast cancer Neg Hx     Prior to Admission medications   Medication Sig Start Date End Date Taking? Authorizing Provider  albuterol-ipratropium (COMBIVENT) 18-103 MCG/ACT inhaler Inhale 2 puffs into the lungs  every 6 (six) hours as needed for wheezing or shortness of breath. 09/01/15  Yes Sharyn Creamer, MD  Naproxen Sod-diphenhydrAMINE (ALEVE PM) 220-25 MG TABS Take 2 tablets by mouth at bedtime.   Yes [provider]  omeprazole (PRILOSEC OTC) 20 MG tablet Take 20 mg by mouth daily.   Yes [provider]    Physical Exam: Vitals:   01/31/19 1530 01/31/19 1545 01/31/19 1600 01/31/19 1615  BP: (!) 160/100 (!) 163/94 (!) 159/105 (!) 165/95  Pulse: 65 66 64 67  Resp: 17 (!) 21 (!) 22 (!) 23  Temp:      SpO2: 91% 90% 94% (!) 89%  Weight:      Height:          General:  Appears calm and comfortable and is NAD  Eyes:  PERRL, EOMI, normal lids, iris  ENT:  grossly normal hearing, lips & tongue, mmm; artificial dentition  Neck:  no LAD, masses or thyromegaly; no carotid bruits  Cardiovascular:  RRR, no m/r/g. No LE edema.   Respiratory:   CTA bilaterally with no wheezes/rales/rhonchi.  Normal respiratory effort.  Abdomen:  soft, NT, ND, NABS  Back:   normal alignment, no CVAT  Skin:  no rash or induration seen on limited exam  Musculoskeletal:  grossly normal tone BUE/BLE, good ROM, no bony abnormality  Psychiatric:  grossly normal mood and affect, speech fluent and appropriate, AOx3  Neurologic:  CN 2-12 grossly intact, moves all extremities in coordinated fashion, sensation intact    Radiological Exams on Admission: Dg Chest 2 View  Result Date: 01/31/2019 CLINICAL DATA:  Left-sided chest pain and shortness of breath. EXAM: CHEST - 2 VIEW COMPARISON:  Chest x-ray dated April 20, 2016. FINDINGS: The heart size and mediastinal contours are within normal limits. Normal pulmonary vascularity. Chronic diffusely coarsened interstitial markings are similar to prior study and likely smoking-related. No focal consolidation, pleural effusion, or pneumothorax. Bilateral nipple shadows. Small rounded density at the peripheral left lung base corresponds to a overlying EKG  electrode. No acute osseous abnormality. IMPRESSION: Chronic smoking related changes without active cardiopulmonary disease. Electronically Signed   By: Obie Dredge M.D.   On: 01/31/2019 11:21   Ct Angio Chest/abd/pel For Dissection W And/or Wo Contrast  Result Date: 01/31/2019 CLINICAL DATA:  Chest pain with pain radiating to left arm and chest area. EXAM: CT ANGIOGRAPHY CHEST, ABDOMEN AND PELVIS TECHNIQUE: Multidetector CT imaging through the chest prior to contrast administration  followed by imaging of the chest, abdomen and pelvis performed using the standard protocol during bolus administration of intravenous contrast. Multiplanar reconstructed images and MIPs were obtained and reviewed to evaluate the vascular anatomy. CONTRAST:  100mL OMNIPAQUE IOHEXOL 350 MG/ML SOLN COMPARISON:  Chest x-ray of 01/31/2019 FINDINGS: CTA CHEST FINDINGS Cardiovascular: Left vertebral artery arises directly from the aortic arch. Otherwise standard branching pattern with patent vessels in the chest. No signs of intramural hematoma or evidence of aortic dissection or acute aortic process. Calcified and noncalcified plaque present in the thoracic aorta, maximal aortic diameter at the level of the aortic arch is 3.7 cm, descending thoracic aorta approximately 2.8 cm. Heart size mild to moderately enlarged. No signs of pericardial effusion. Central pulmonary vasculature is unremarkable. Mediastinum/Nodes: No signs of adenopathy in the chest. Small pre pericardial lymph nodes less than pathologic in terms of overall size. Lungs/Pleura: No signs of effusion, no consolidation. Atelectasis at the lung bases with scattered ground-glass attenuation and signs of mosaic attenuation. Filling defects in right lower lobe bronchi. Some anterior posterior narrowing of bilateral main stem bronchi. Trachea appears normal. 6 mm subpleural nodule in the anterior right chest (image 63, series 9) 4 mm subpleural nodule in the peripheral right  chest adjacent to 2 additional nodules the largest of which is a right middle lobe nodule measuring 5 mm (images 94 through 102, series 9) Small nodule in the right upper lobe measures 5 mm (image 36, series 9) Scattered numerous subpleural nodules throughout the left chest with similar distribution mainly in upper mid lung Signs of subpleural reticulation. Musculoskeletal: No signs of chest wall mass. Review of the MIP images confirms the above findings. CTA ABDOMEN AND PELVIS FINDINGS VASCULAR Aorta: Scattered atherosclerosis. No signs of aneurysm or dissection in the abdomen. Celiac: Patent celiac axis with no significant luminal narrowing or aneurysmal dilation. Standard branching pattern. SMA: Patent SMA. Renals: Accessory renal artery on the right to the lower pole arises just distal to the main renal artery. Accessory renal artery on the left arises just superior to the left renal artery closer to the origin of the SMA. IMA: Patent IMA. Some atherosclerotic plaque in the region of the IMA origin. Inflow: Patent without evidence of aneurysm, dissection, vasculitis or significant stenosis. Veins: Venous phase not performed. Question of portosystemic collaterals in the upper abdomen. Review of the MIP images confirms the above findings. NON-VASCULAR Hepatobiliary: No sign of focal, suspicious hepatic lesion. However, in this cirrhotic patient only immediate arterial phase was performed. Biliary tree is normal. Pancreas: Unremarkable. No pancreatic ductal dilatation or surrounding inflammatory changes. Spleen: Normal in size without focal abnormality. Adrenals/Urinary Tract: Mild adrenal thickening. No suspicious adrenal lesion. 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 Lower pole moiety with moderate dilation with respect to the ureter on the right but with no signs of hydronephrosis. Moderate renal cortical scarring is evident bilaterally. Stomach/Bowel: No sign of acute  gastrointestinal process. Appendix not visualized but no signs of appendiceal inflammation/Peri cecal inflammation. Lymphatic: As above. Atherosclerotic changes extend into mildly tortuous but nonaneurysmal iliac vessels. Scattered upper abdominal lymph nodes. No retroperitoneal adenopathy. No sign of pelvic lymphadenopathy. Reproductive: Uterus grossly normal by CT. Other: Choose 1 Musculoskeletal: No sign of acute bone finding or evidence of destructive bone process. Review of the MIP images confirms the above findings. IMPRESSION: 1. No evidence of aneurysm or dissection in the thoracic or abdominal aorta. Atherosclerotic changes in the thoracic and abdominal aorta. 2. Multiple pulmonary nodules measuring up  to 6 mm. Nodules may be even be related to infection or inflammation given below findings. Non-contrast chest CT at 3-6 months is recommended. If the nodules are stable at time of repeat CT, then future CT at 18-24 months (from today's scan) is considered optional for low-risk patients, but is recommended for high-risk patients. This recommendation follows the consensus statement: Guidelines for Management of Incidental Pulmonary Nodules Detected on CT Images: From the Fleischner Society 2017; Radiology 2017; 284:228-243. 3. Signs of potential bronchomalacia, acquired in the setting of COPD with concomitant bronchopneumonia with endobronchial material in the right lower lobe bronchus on today's scan. 4. Areas of ground-glass and mosaic attenuation may represent a combination of pneumonitis and air trapping. 5. Question portosystemic collaterals in the upper abdomen in the setting of cirrhosis. 6. 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 Renal cortical scarring and right lower pole moiety ureteral dilation may be related to reflux, no current hydronephrosis. Follow-up may be helpful as well as correlation with any history of recurrent urinary tract infections. 7. No other acute  abnormalities. Aortic Atherosclerosis (ICD10-I70.0). Electronically Signed   By: Donzetta Kohut M.D.   On: 01/31/2019 13:41    EKG: Independently reviewed.  NSR with rate 76; nonspecific ST changes with no evidence of acute ischemia   Labs on Admission: I have personally reviewed the available labs and imaging studies at the time of the admission.  Pertinent labs:   Na++ 132 CO2 20 Glucose 108 AST 72/ALT 64 HS troponin 6, 5 WBC 8.9 Hgb 15.8 COVID pending   Assessment/Plan Principal Problem:   Chest pain Active Problems:   Essential hypertension   COPD (chronic obstructive pulmonary disease) ([facility])   Tobacco dependence   History of substance abuse ([facility])   Pulmonary nodules/lesions, multiple   Chest pain -Patient with left-sided chest pain that came on at rest, improved with Dilaudid -0-1/3 typical symptoms suggestive of noncardiac chest pain.  -CXR unremarkable for acute disease. -Chest CTA for dissection negative (other than as noted below).   -Initial cardiac HS troponin negative, and delta also negative.  -EKG not indicative of acute ischemia.   -HEART pathway score is 4, indicating that the patient has an elevated risk score and requires further evaluation. -Will plan to place in observation status on telemetry to rule out ACS by overnight observation.  -Repeat EKG in AM -Start ASA 81 mg daily -Risk factor stratification with HgbA1c and FLP; will also check TSH and UDS -Cardiology consultation in AM -NPO for possible stress test  -Start empiric Lipitor 20 mg daily for now  HTN -Prior h/o HTN with medications but has been off meds for years -Appears to have uncontrolled HTN and is likely to benefit from therapy -She was given a dose of Labetalol while in the ER with good result -Will start low-dose (due to lowish HR) Lopressor for now -Will add prn IV hydralazine -Symptoms could have been associated with HTN urgency  Pulmonary nodules -Multiple nodules seen on  CTA -No significant infectious history concerning for PNA or other infectious etiology -Needs f/u with PCP -Also with GGO and air trapping, more likely associated with chronic lung disease than acute by patient description -COVID pending  H/o substance abuse -Denies significant ETOH use but has noted h/o ETOH abuse and acknowledged prior drug problem in recovery -Continues to drink occasionally -Mildly elevated LFTs with evidence of cirrhosis on CTA -Needs outpatient f/u and ETOH cessation  COPD with ongoing tobacco dependence -Chronic lung disease with ongoing  tobacco dependence -Acknowledges need to quit smoking -Does not appear to have acute exacerbation of COPD at this time  -Not on chronic O2 and does not need acute O2 -Tobacco Dependence: encourage cessation.  This was discussed with the patient and should be reviewed on an ongoing basis.   -Patch ordered at patient request.     Note: This patient has been tested and is pending for the novel coronavirus COVID-19.    DVT prophylaxis: Lovenox  Code Status:  Full - confirmed with patient Family Communication: None present Disposition Plan:  Home once clinically improved Consults called: Cardiology  Admission status: It is my clinical opinion that referral for OBSERVATION is reasonable and necessary in this patient based on the above information provided. The aforementioned taken together are felt to place the patient at high risk for further clinical deterioration. However it is anticipated that the patient may be medically stable for discharge from the hospital within 24 to 48 hours.     Jonah Blue MD [facility]   How to contact the [facility] Attending or Consulting provider 7A - 7P or covering provider during after hours 7P -7A, for this patient?  1. Check the care team in [facility] and look for a) attending/consulting [facility] provider listed and b) the [facility] team listed 2. Log into [email or web address] and use [facility]  universal password to access. If you do not have the password, please contact the hospital operator. 3. Locate the [facility] provider you are looking for under [facility] and page to a number that you can be directly reached. 4. If you still have difficulty reaching the provider, please page the [facility] (Director on Call) for the Hospitalists listed on amion for assistance.   01/31/2019, 4:36 PM

## 2019-01-31 NOTE — ED Notes (Signed)
ED TO INPATIENT HANDOFF REPORT  ED Nurse Name and Phone #: Jeannett SeniorStephen [phone]  S Name/Age/Gender Kathryn Banks 63 y.o. female Room/Bed: 057C/057C  Code Status   Code Status: Full Code  Home/SNF/Other Home Patient oriented to: self, place, time and situation Is this baseline? Yes   Triage Complete: Triage complete  Chief Complaint chest pain  Triage Note C/o chest pain started suddenly left chest area radiating to left arm, left hand numbness/tingling, pt is tearful in triage-- denies injury or heavy lifting -- has never had this kind of pain in past.    Allergies No Known Allergies  Level of Care/Admitting Diagnosis ED Disposition    ED Disposition Condition Comment   Admit  [facility] [[id number]]  Level of Care: Telemetry Cardiac [103]  I expect the patient will be discharged within 24 hours: Yes  LOW acuity---Tx typically complete <24 hrs---ACUTE conditions typically can be evaluated <24 hours---LABS likely to return to acceptable levels <24 hours---IS near functional baseline---EXPECTED to return to current living arrangement---NOT newly hypoxic: Meets criteria for 5C-Observation unit  Covid Evaluation: Asymptomatic Screening Protocol (No Symptoms)  Diagnosis: Chest pain [[id number]][[id number]]  Admitting Physician: Jonah BlueYATES, JENNIFER [[id number]]  Attending Physician: Jonah BlueYATES, JENNIFER [[id number]]  PT Class (Do Not Modify): Observation [104]  PT Acc Code (Do Not Modify): Observation [10022]       B Medical/Surgery History Past Medical History:  Diagnosis Date  . Acid reflux   . Arthritis    FINGERS  . COPD (chronic obstructive pulmonary disease) (HCC)   . Depression   . ETOH abuse   . Hepatitis    C-PT STATED SHE WAS TOLD THIS 2014  . Hypertension    OFF MEDS FOR FEW YEARS   Past Surgical History:  Procedure Laterality Date  . CESAREAN SECTION    . OPEN REDUCTION INTERNAL FIXATION (ORIF) DISTAL RADIAL FRACTURE Right 10/15/2017   Procedure: OPEN REDUCTION  INTERNAL FIXATION (ORIF) DISTAL RADIAL FRACTURE;  Surgeon: Kennedy BuckerMenz, Michael, MD;  Location: [facility] ORS;  Service: Orthopedics;  Laterality: Right;     A IV Location/Drains/Wounds Patient Lines/Drains/Airways Status   Active Line/Drains/Airways    Name:   Placement date:   Placement time:   Site:   Days:   Peripheral IV 01/31/19 Right Antecubital   01/31/19    1140    Antecubital   less than 1   Incision (Closed) 10/15/17 Hand Right   10/15/17    1253     473          Intake/Output Last 24 hours No intake or output data in the 24 hours ending 01/31/19 1719  Labs/Imaging Results for orders placed or performed during the hospital encounter of 01/31/19 (from the past 48 hour(s))  Basic metabolic panel     Status: Abnormal   Collection Time: 01/31/19 10:54 AM  Result Value Ref Range   Sodium 132 (L) 135 - 145 mmol/L   Potassium 4.5 3.5 - 5.1 mmol/L   Chloride 100 98 - 111 mmol/L   CO2 20 (L) 22 - 32 mmol/L   Glucose, Bld 108 (H) 70 - 99 mg/dL   BUN 14 8 - 23 mg/dL   Creatinine, Ser 0.980.91 0.44 - 1.00 mg/dL   Calcium 9.0 8.9 - 11.910.3 mg/dL   GFR calc non Af Amer >60 >60 mL/min   GFR calc Af Amer >60 >60 mL/min   Anion gap 12 5 - 15    Comment: Performed at [facility], [address] [place], [place] [address]  CBC     Status: Abnormal   Collection Time: 01/31/19 10:54 AM  Result Value Ref Range   WBC 8.9 4.0 - 10.5 K/uL   RBC 5.12 (H) 3.87 - 5.11 MIL/uL   Hemoglobin 15.8 (H) 12.0 - 15.0 g/dL   HCT 46.5 (H) 36.0 - 46.0 %   MCV 90.8 80.0 - 100.0 fL   MCH 30.9 26.0 - 34.0 pg   MCHC 34.0 30.0 - 36.0 g/dL   RDW 13.9 11.5 - 15.5 %   Platelets 358 150 - 400 K/uL   nRBC 0.0 0.0 - 0.2 %    Comment: Performed at [facility], [place] [address] [place], [place] [address]  Troponin I (High Sensitivity)     Status: None   Collection Time: 01/31/19 10:54 AM  Result Value Ref Range   Troponin I (High Sensitivity) 6 <18 ng/L    Comment: (NOTE) Elevated high sensitivity troponin I  (hsTnI) values and significant  changes across serial measurements may suggest ACS but many other  chronic and acute conditions are known to elevate hsTnI results.  Refer to the "Links" section for chest pain algorithms and additional  guidance. Performed at [facility], [place] [address], [place], [place] [address]   Hepatic function panel     Status: Abnormal   Collection Time: 01/31/19 11:40 AM  Result Value Ref Range   Total Protein 8.4 (H) 6.5 - 8.1 g/dL   Albumin 4.0 3.5 - 5.0 g/dL   AST 72 (H) 15 - 41 U/L   ALT 64 (H) 0 - 44 U/L   Alkaline Phosphatase 113 38 - 126 U/L   Total Bilirubin 1.2 0.3 - 1.2 mg/dL   Bilirubin, Direct 0.3 (H) 0.0 - 0.2 mg/dL   Indirect Bilirubin 0.9 0.3 - 0.9 mg/dL    Comment: Performed at [facility] [address], [place], [place] [address]  Lipase, blood     Status: None   Collection Time: 01/31/19 11:40 AM  Result Value Ref Range   Lipase 32 11 - 51 U/L    Comment: Performed at [facility] [address] [place], [place] [address]  Troponin I (High Sensitivity)     Status: None   Collection Time: 01/31/19  1:40 PM  Result Value Ref Range   Troponin I (High Sensitivity) 5 <18 ng/L    Comment: (NOTE) Elevated high sensitivity troponin I (hsTnI) values and significant  changes across serial measurements may suggest ACS but many other  chronic and acute conditions are known to elevate hsTnI results.  Refer to the "Links" section for chest pain algorithms and additional  guidance. Performed at [facility], [place] [address] [place], [place] [address]    Dg Chest 2 View  Result Date: 01/31/2019 CLINICAL DATA:  Left-sided chest pain and shortness of breath. EXAM: CHEST - 2 VIEW COMPARISON:  Chest x-ray dated April 20, 2016. FINDINGS: The heart size and mediastinal contours are within normal limits. Normal pulmonary vascularity. Chronic diffusely coarsened interstitial markings are similar to prior study and likely  smoking-related. No focal consolidation, pleural effusion, or pneumothorax. Bilateral nipple shadows. Small rounded density at the peripheral left lung base corresponds to a overlying EKG electrode. No acute osseous abnormality. IMPRESSION: Chronic smoking related changes without active cardiopulmonary disease. Electronically Signed   By: Titus Dubin M.D.   On: 01/31/2019 11:21   Ct Angio Chest/abd/pel For Dissection W And/or Wo Contrast  Result Date: 01/31/2019 CLINICAL DATA:  Chest pain with pain radiating to left arm and chest area. EXAM: CT ANGIOGRAPHY CHEST, ABDOMEN AND PELVIS TECHNIQUE: Multidetector CT imaging through the chest prior to contrast administration followed by imaging of the chest, abdomen and pelvis performed using the standard protocol during bolus administration of intravenous contrast. Multiplanar reconstructed images and MIPs were obtained and reviewed to evaluate the vascular anatomy. CONTRAST:  OMNIPAQUE IOHEXOL 350 MG/ML SOLN COMPARISON:  Chest x-ray of 01/31/2019 FINDINGS: CTA CHEST FINDINGS Cardiovascular: Left vertebral artery arises directly from the aortic arch. Otherwise standard branching pattern with patent vessels in the chest. No signs of intramural hematoma or evidence of aortic dissection or acute aortic process. Calcified and noncalcified plaque present in the thoracic aorta, maximal aortic diameter at the level of the aortic arch is 3.7 cm, descending thoracic aorta approximately 2.8 cm. Heart size mild to moderately enlarged. No signs of pericardial effusion. Central pulmonary vasculature is unremarkable. Mediastinum/Nodes: No signs of adenopathy in the chest. Small pre pericardial lymph nodes less than pathologic in terms of overall size. Lungs/Pleura: No signs of effusion, no consolidation. Atelectasis at the lung bases with scattered ground-glass attenuation and signs of mosaic attenuation. Filling defects in right lower lobe bronchi. Some anterior posterior  narrowing of bilateral main stem bronchi. Trachea appears normal. 6 mm subpleural nodule in the anterior right chest (image 63, series 9) 4 mm subpleural nodule in the peripheral right chest adjacent to 2 additional nodules the largest of which is a right middle lobe nodule measuring 5 mm (images 94 through 102, series 9) Small nodule in the right upper lobe measures 5 mm (image 36, series 9) Scattered numerous subpleural nodules throughout the left chest with similar distribution mainly in upper mid lung Signs of subpleural reticulation. Musculoskeletal: No signs of chest wall mass. Review of the MIP images confirms the above findings. CTA ABDOMEN AND PELVIS FINDINGS VASCULAR Aorta: Scattered atherosclerosis. No signs of aneurysm or dissection in the abdomen. Celiac: Patent celiac axis with no significant luminal narrowing or aneurysmal dilation. Standard branching pattern. SMA: Patent SMA. Renals: Accessory renal artery on the right to the lower pole arises just distal to the main renal artery. Accessory renal artery on the left arises just superior to the left renal artery closer to the origin of the SMA. IMA: Patent IMA. Some atherosclerotic plaque in the region of the IMA origin. Inflow: Patent without evidence of aneurysm, dissection, vasculitis or significant stenosis. Veins: Venous phase not performed. Question of portosystemic collaterals in the upper abdomen. Review of the MIP images confirms the above findings. NON-VASCULAR Hepatobiliary: No sign of focal, suspicious hepatic lesion. However, in this cirrhotic patient only immediate arterial phase was performed. Biliary tree is normal. Pancreas: Unremarkable. No pancreatic ductal dilatation or surrounding inflammatory changes. Spleen: Normal in size without focal abnormality. Adrenals/Urinary Tract: Mild adrenal thickening. No suspicious adrenal lesion. 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 Lower pole moiety with moderate  dilation with respect to the ureter on the right but with no signs of hydronephrosis. Moderate renal cortical scarring is evident bilaterally. Stomach/Bowel: No sign of acute gastrointestinal process. Appendix not visualized but no signs of appendiceal inflammation/Peri cecal inflammation. Lymphatic: As above. Atherosclerotic changes extend into mildly tortuous but nonaneurysmal iliac vessels. Scattered upper abdominal lymph nodes. No retroperitoneal adenopathy. No sign of pelvic lymphadenopathy. Reproductive: Uterus grossly normal by CT. Other: Choose 1 Musculoskeletal: No sign of acute bone finding or evidence of destructive bone process. Review of  the MIP images confirms the above findings. IMPRESSION: 1. No evidence of aneurysm or dissection in the thoracic or abdominal aorta. Atherosclerotic changes in the thoracic and abdominal aorta. 2. Multiple pulmonary nodules measuring up to 6 mm. Nodules may be even be related to infection or inflammation given below findings. Non-contrast chest CT at 3-6 months is recommended. If the nodules are stable at time of repeat CT, then future CT at 18-24 months (from today's scan) is considered optional for low-risk patients, but is recommended for high-risk patients. This recommendation follows the consensus statement: Guidelines for Management of Incidental Pulmonary Nodules Detected on CT Images: From the Fleischner Society 2017; Radiology 2017; 284:228-243. 3. Signs of potential bronchomalacia, acquired in the setting of COPD with concomitant bronchopneumonia with endobronchial material in the right lower lobe bronchus on today's scan. 4. Areas of ground-glass and mosaic attenuation may represent a combination of pneumonitis and air trapping. 5. Question portosystemic collaterals in the upper abdomen in the setting of cirrhosis. 6. 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 Renal cortical scarring and right lower pole moiety ureteral dilation  may be related to reflux, no current hydronephrosis. Follow-up may be helpful as well as correlation with any history of recurrent urinary tract infections. 7. No other acute abnormalities. Aortic Atherosclerosis (ICD10-I70.0). Electronically Signed   By: Donzetta Kohut M.D.   On: 01/31/2019 13:41    Pending Labs Unresulted Labs (From admission, onward)    Start     Ordered   02/01/19 0500  Lipid panel  Tomorrow morning,   R     01/31/19 1631   01/31/19 1632  TSH  Once,   STAT     01/31/19 1631   01/31/19 1632  Hemoglobin A1c  Once,   STAT     01/31/19 1631   01/31/19 1632  Urine rapid drug screen (hosp performed)  ONCE - STAT,   STAT     01/31/19 1631   01/31/19 1631  HIV Antibody (routine testing w rflx)  (HIV Antibody (Routine testing w reflex) panel)  Once,   STAT     01/31/19 1631   01/31/19 1504  SARS CORONAVIRUS 2 (TAT 6-24 HRS) Nasopharyngeal Nasopharyngeal Swab  (Asymptomatic/Tier 2 Patients Labs)  Once,   STAT    Question Answer Comment  Is this test for diagnosis or screening Screening   Symptomatic for COVID-19 as defined by CDC No   Hospitalized for COVID-19 No   Admitted to ICU for COVID-19 No   Previously tested for COVID-19 No   Resident in a congregate (group) care setting No   Employed in healthcare setting No   Pregnant No      01/31/19 1503          Vitals/Pain Today's Vitals   01/31/19 1545 01/31/19 1600 01/31/19 1601 01/31/19 1615  BP: (!) 163/94 (!) 159/105  (!) 165/95  Pulse: 66 64  67  Resp: (!) 21 (!) 22  (!) 23  Temp:      SpO2: 90% 94%  (!) 89%  Weight:      Height:      PainSc:   3      Isolation Precautions No active isolations  Medications Medications  omeprazole (PRILOSEC OTC) EC tablet 20 mg (has no administration in time range)  Combivent 18-103 MCG/ACT AERO 2 puff (has no administration in time range)  enoxaparin (LOVENOX) injection 40 mg (has no administration in time range)  acetaminophen (TYLENOL) tablet 650 mg (has no  administration  in time range)  ondansetron (ZOFRAN) injection 4 mg (has no administration in time range)  aspirin EC tablet 81 mg (has no administration in time range)  atorvastatin (LIPITOR) tablet 20 mg (has no administration in time range)  nicotine (NICODERM CQ - dosed in mg/24 hours) patch 21 mg (has no administration in time range)  metoprolol tartrate (LOPRESSOR) tablet 12.5 mg (has no administration in time range)  hydrALAZINE (APRESOLINE) injection 5 mg (has no administration in time range)  HYDROmorphone (DILAUDID) injection 0.5 mg (0.5 mg Intravenous Given 01/31/19 1145)  ondansetron (ZOFRAN) injection 4 mg (4 mg Intravenous Given 01/31/19 1145)  iohexol (OMNIPAQUE) 350 MG/ML injection 100 mL (100 mLs Intravenous Contrast Given 01/31/19 1241)  HYDROmorphone (DILAUDID) injection 0.5 mg (0.5 mg Intravenous Given 01/31/19 1255)  labetalol (NORMODYNE) injection 10 mg (10 mg Intravenous Given 01/31/19 1428)  HYDROmorphone (DILAUDID) injection 0.5 mg (0.5 mg Intravenous Given 01/31/19 1539)    Mobility walks Low fall risk   Focused Assessments Cardiac Assessment Handoff:  Cardiac Rhythm: Normal sinus rhythm Lab Results  Component Value Date   TROPONINI < 0.02 08/26/2012   No results found for: DDIMER Does the Patient currently have chest pain? Yes     R Recommendations: See Admitting Provider Note  Report given to:   Additional Notes:

## 2019-01-31 NOTE — ED Provider Notes (Signed)
[facility] EMERGENCY DEPARTMENT Provider Note   CSN: [id number] Arrival date & time: 01/31/19  1034     History   Chief Complaint Chief Complaint  Patient presents with  . Chest Pain    HPI Kathryn Banks is a 63 y.o. female.     Patient with history of COPD, smoking, alcohol use, hepatitis--presents to the emergency department with abrupt onset of severe left-sided chest pain with radiation to her left shoulder blade area at approximately 8:45 AM today.  Patient states that she was sitting, playing a game on her phone when the symptoms started.  She did have some associated shortness of breath.  No vomiting or fevers.  No cough.  She denies any recent injuries.  Patient has some chronic numbness and tingling in her left arm which was exacerbated by her symptoms.  She went on to work but had to leave due to her symptoms.  She states history of hypertension however her blood pressure improved to the point where she was able to come off of her medications.  Blood pressure is quite high on arrival at 180/110.   No abdominal pains.  No stroke symptoms such as vision change, slurred speech, facial droop, extremity weakness.       Past Medical History:  Diagnosis Date  . Acid reflux   . Arthritis    FINGERS  . COPD (chronic obstructive pulmonary disease) (HCC)   . Depression   . ETOH abuse   . Hepatitis    C-PT STATED SHE WAS TOLD THIS 2014  . Hypertension    OFF MEDS FOR FEW YEARS    There are no active problems to display for this patient.   Past Surgical History:  Procedure Laterality Date  . CESAREAN SECTION    . OPEN REDUCTION INTERNAL FIXATION (ORIF) DISTAL RADIAL FRACTURE Right 10/15/2017   Procedure: OPEN REDUCTION INTERNAL FIXATION (ORIF) DISTAL RADIAL FRACTURE;  Surgeon: Kennedy Bucker, MD;  Location: [facility] ORS;  Service: Orthopedics;  Laterality: Right;     OB History   No obstetric history on file.      Home Medications    Prior to Admission  medications   Medication Sig Start Date End Date Taking? Authorizing Provider  albuterol-ipratropium (COMBIVENT) 18-103 MCG/ACT inhaler Inhale 2 puffs into the lungs every 6 (six) hours as needed for wheezing or shortness of breath. 09/01/15   Sharyn Creamer, MD  amoxicillin (AMOXIL) 500 MG capsule Take 1 capsule (500 mg total) by mouth 3 (three) times daily. Patient not taking: Reported on 10/13/2017 04/20/16   Irean Hong, MD  azithromycin (ZITHROMAX) 250 MG tablet Take 1 tablet (250 mg total) by mouth daily. Patient not taking: Reported on 10/13/2017 04/20/16   Irean Hong, MD  buPROPion [facility] SR) 150 MG 12 hr tablet Take 150 mg by mouth 2 (two) times daily. 12/20/14   [provider]  omeprazole (PRILOSEC) 10 MG capsule Take 10 mg by mouth every morning.     [provider]  predniSONE (DELTASONE) 20 MG tablet Take 2 tablets (40 mg total) by mouth daily with breakfast. Patient not taking: Reported on 10/13/2017 09/01/15   Sharyn Creamer, MD  sertraline (ZOLOFT) 100 MG tablet Take 150 mg by mouth daily. 08/01/15   [provider]  traMADol (ULTRAM) 50 MG tablet Take 2 tablets (100 mg total) by mouth every 6 (six) hours as needed. 10/15/17   Kennedy Bucker, MD    Family History Family History  Problem Relation Age  of Onset  . Breast cancer Neg Hx     Social History Social History   Tobacco Use  . Smoking status: Current Every Day Smoker    Packs/day: 1.00    Years: 44.00    Pack years: 44.00    Types: Cigarettes  . Smokeless tobacco: Never Used  Substance Use Topics  . Alcohol use: Yes    Comment: OCC -- beer and vodka  . Drug use: Yes    Types: Opium, Benzodiazepines, Heroin    Comment: opioids, valium, xanax-PT STATES SHE LAST USED HEROIN 35 YEARS AGO     Allergies   Patient has no known allergies.   Review of Systems Review of Systems  Constitutional: Negative for diaphoresis and fever.  Eyes: Negative for redness.  Respiratory: Positive for  shortness of breath. Negative for cough.   Cardiovascular: Positive for chest pain. Negative for palpitations and leg swelling.  Gastrointestinal: Positive for nausea. Negative for abdominal pain and vomiting.  Genitourinary: Negative for dysuria.  Musculoskeletal: Positive for back pain. Negative for neck pain.  Skin: Negative for rash.  Neurological: Negative for syncope and light-headedness.  Psychiatric/Behavioral: The patient is not nervous/anxious.      Physical Exam Updated Vital Signs BP (!) 180/110   Pulse 70   Temp 97.8 F (36.6 C)   Resp 20   Ht  (1.626 m)   Wt 63.5 kg   LMP 09/25/2005 (Approximate)   SpO2 95%   BMI 24.03 kg/m   Physical Exam Vitals signs and nursing note reviewed.  Constitutional:      Appearance: She is well-developed. She is not diaphoretic.  HENT:     Head: Normocephalic and atraumatic.     Mouth/Throat:     Mouth: Mucous membranes are not dry.  Eyes:     Conjunctiva/sclera: Conjunctivae normal.  Neck:     Musculoskeletal: Normal range of motion and neck supple. No muscular tenderness.     Vascular: Normal carotid pulses. No carotid bruit or JVD.     Trachea: Trachea normal. No tracheal deviation.  Cardiovascular:     Rate and Rhythm: Normal rate and regular rhythm.     Pulses: No decreased pulses.          Radial pulses are 2+ on the right side and 2+ on the left side.     Heart sounds: Normal heart sounds, S1 normal and S2 normal. No murmur.  Pulmonary:     Effort: Pulmonary effort is normal. No respiratory distress.     Breath sounds: No decreased breath sounds, wheezing or rhonchi.  Chest:     Chest wall: No tenderness.  Abdominal:     General: Bowel sounds are normal.     Palpations: Abdomen is soft.     Tenderness: There is no abdominal tenderness. There is no guarding or rebound.  Musculoskeletal: Normal range of motion.     Right lower leg: She exhibits no tenderness. No edema.     Left lower leg: She exhibits no  tenderness. No edema.     Comments: No clinical signs of DVT.  Skin:    General: Skin is warm and dry.     Coloration: Skin is not pale.  Neurological:     Mental Status: She is alert.      ED Treatments / Results  Labs (all labs ordered are listed, but only abnormal results are displayed) Labs Reviewed  BASIC METABOLIC PANEL - Abnormal; Notable for the following components:  Result Value   Sodium 132 (*)    CO2 20 (*)    Glucose, Bld 108 (*)    All other components within normal limits  CBC - Abnormal; Notable for the following components:   RBC 5.12 (*)    Hemoglobin 15.8 (*)    HCT 46.5 (*)    All other components within normal limits  HEPATIC FUNCTION PANEL - Abnormal; Notable for the following components:   Total Protein 8.4 (*)    AST 72 (*)    ALT 64 (*)    Bilirubin, Direct 0.3 (*)    All other components within normal limits  SARS CORONAVIRUS 2 (TAT 6-24 HRS)  LIPASE, BLOOD  TROPONIN I (HIGH SENSITIVITY)  TROPONIN I (HIGH SENSITIVITY)   ED ECG REPORT   Date: 01/31/2019  Rate: 76  Rhythm: normal sinus rhythm  QRS Axis: normal  Intervals: normal  ST/T Wave abnormalities: nonspecific ST changes  Conduction Disutrbances:none  Narrative Interpretation: artifact noted  Old EKG Reviewed: changes noted, non-specific ST changes since 10/14/17  I have personally reviewed the EKG tracing and agree with the computerized printout as noted.   Radiology Dg Chest 2 View  Result Date: 01/31/2019 CLINICAL DATA:  Left-sided chest pain and shortness of breath. EXAM: CHEST - 2 VIEW COMPARISON:  Chest x-ray dated April 20, 2016. FINDINGS: The heart size and mediastinal contours are within normal limits. Normal pulmonary vascularity. Chronic diffusely coarsened interstitial markings are similar to prior study and likely smoking-related. No focal consolidation, pleural effusion, or pneumothorax. Bilateral nipple shadows. Small rounded density at the peripheral left lung  base corresponds to a overlying EKG electrode. No acute osseous abnormality. IMPRESSION: Chronic smoking related changes without active cardiopulmonary disease. Electronically Signed   By: Obie DredgeWilliam T Derry M.D.   On: 01/31/2019 11:21    Procedures Procedures (including critical care time)  Medications Ordered in ED Medications  sodium chloride flush (NS) 0.9 % injection 3 mL (0 mLs Intravenous Hold 01/31/19 1314)  HYDROmorphone (DILAUDID) injection 0.5 mg (0.5 mg Intravenous Given 01/31/19 1145)  ondansetron (ZOFRAN) injection 4 mg (4 mg Intravenous Given 01/31/19 1145)  iohexol (OMNIPAQUE) 350 MG/ML injection 100 mL (100 mLs Intravenous Contrast Given 01/31/19 1241)  HYDROmorphone (DILAUDID) injection 0.5 mg (0.5 mg Intravenous Given 01/31/19 1255)  labetalol (NORMODYNE) injection 10 mg (10 mg Intravenous Given 01/31/19 1428)  HYDROmorphone (DILAUDID) injection 0.5 mg (0.5 mg Intravenous Given 01/31/19 1539)     Initial Impression / Assessment and Plan / ED Course  I have reviewed the triage vital signs and the nursing notes.  Pertinent labs & imaging results that were available during my care of the patient were reviewed by me and considered in my medical decision making (see chart for details).        Patient seen and examined. Work-up initiated. Medications ordered.  Severe onset of chest pain with radiation to the back is concerning.  EKG does not show STEMI.  Chest x-ray without signs of pneumothorax.  Unclear etiology of the patient's pain at this point.  Her blood pressure is quite high, in the 190s systolic while I was in the room.  Feel it would be prudent to rule out dissection and pulmonary embolus as cause of the patient's symptoms today.  We will continue to monitor.  Will attempt to control pain and recheck blood pressure.  Pulse rate is currently reasonable at 70.  Vital signs reviewed and are as follows: BP (!) 180/110   Pulse 70   Temp  97.8 F (36.6 C)   Resp 20   Ht 5'  4" (1.626 m)   Wt 63.5 kg   LMP 09/25/2005 (Approximate)   SpO2 95%   BMI 24.03 kg/m   3:27 PM reviewed patient's imaging earlier.  She continues to have pain and requires IV pain medication.  Patient has a moderate risk heart score and ongoing chest pain.  It is unclear why this patient is having pain at this point, cannot sufficiently rule out cardiac etiology in the ED.  Feel that she would benefit from further inpatient rule out.  Discussed patient with Dr. Johnney Killian.  Discussed results with patient at this time.  I spoke with Dr. Lorin Mercy who will see patient.  Blood pressure has improved after 1 dose of IV labetalol.  Final Clinical Impressions(s) / ED Diagnoses   Final diagnoses:  Precordial pain  Hypertensive urgency   Admit for continued chest pain evaluation and blood pressure control.  ED Discharge Orders    None       Carlisle Cater, Hershal Coria 01/31/19 1619    Charlesetta Shanks, MD 02/11/19 1208

## 2019-01-31 NOTE — ED Triage Notes (Signed)
C/o chest pain started suddenly left chest area radiating to left arm, left hand numbness/tingling, pt is tearful in triage-- denies injury or heavy lifting -- has never had this kind of pain in past.

## 2019-02-01 DIAGNOSIS — E039 Hypothyroidism, unspecified: Secondary | ICD-10-CM

## 2019-02-01 DIAGNOSIS — E038 Other specified hypothyroidism: Secondary | ICD-10-CM

## 2019-02-01 LAB — LIPID PANEL
Cholesterol: 157 mg/dL (ref 0–200)
HDL: 54 mg/dL (ref >40)
LDL Cholesterol: 88 mg/dL (ref 0–99)
Total CHOL/HDL Ratio: 2.9 RATIO
Triglycerides: 74 mg/dL (ref <150)
VLDL: 15 mg/dL (ref 0–40)

## 2019-02-01 LAB — PROCALCITONIN: Procalcitonin: <0.1 ng/mL

## 2019-02-01 LAB — T4, FREE: Free T4: 0.85 ng/dL (ref 0.61–1.12)

## 2019-02-01 MED ORDER — METOPROLOL TARTRATE 25 MG PO TABS: 12.5000 mg | ORAL_TABLET | Freq: Two times a day (BID) | ORAL | 0 refills | Status: AC

## 2019-02-01 MED ORDER — AMLODIPINE BESYLATE 5 MG PO TABS: 5.0000 mg | ORAL_TABLET | Freq: Every day | ORAL | 0 refills | Status: AC

## 2019-02-01 MED ORDER — AMLODIPINE BESYLATE 5 MG PO TABS: 5.0000 mg | ORAL_TABLET | Freq: Every day | ORAL | 0 refills | Status: DC

## 2019-02-01 MED ORDER — METOPROLOL TARTRATE 25 MG PO TABS: 12.5000 mg | ORAL_TABLET | Freq: Two times a day (BID) | ORAL | 0 refills | Status: DC

## 2019-02-01 MED FILL — METOPROLOL TARTRATE 25 MG T: 25 | 30 days supply | Qty: 30 | Fill #0

## 2019-02-01 MED FILL — AMLODIPINE BESYLATE 5 MG TA: 5 | 30 days supply | Qty: 30 | Fill #0

## 2019-02-01 NOTE — Discharge Instructions (Signed)

## 2019-02-01 NOTE — Discharge Summary (Signed)
Physician Discharge Summary  Kathryn Banks ZOX:[id number] DOB: 18-Nov-1955 DOA: 01/31/2019  PCP: [name], MD  Admit date: 01/31/2019 Discharge date: 02/01/2019  Admitted From: Home Disposition: Home  Recommendations for Outpatient Follow-up:  1. Follow up with PCP in 1-2 weeks 2. Follow-up with your PCP for arranging repeat CT of the chest in 3 to 6 months for pulmonary nodules 3. Please obtain BMP/CBC in one week 4. Please follow up on the following pending results:  Home Health: None none Equipment/Devices: None  Discharge Condition: Stable CODE STATUS: Full code Diet recommendation: Cardiac/low-sodium  Subjective: Seen and examined.  She continues to complain of chest pain under her left breast.  No shortness of breath, diaphoresis, nausea or dizziness or any other complaint.  Brief/Interim Summary: Kathryn Banks is a 63 y.o. female with medical history significant of HTN; ETOH abuse; depression; and COPD presented with chest pain that started on the morning of 01/31/2019 while she was watching TV.Pain was around her left breast.  Nonradiating with no aggravating or relieving factor. Her BP was markedly elevated upon arrival.  She was given antihypertensives and pain medication which helped relieve some pain. She was admitted to hospital service for ACS rule out.  Troponins were completely negative.  Monitor on telemetry which did not show any acute events.  On my evaluation today, her pain is slightly better but still there.  On examination, she has point tenderness under her left breast jesting possible musculoskeletal pain supported by negative cardiac enzymes.  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  She takes Aleve PM on daily basis.  Based on this, she does not need further cardiac work-up.  She also underwent CT angiogram of the chest which ruled out aortic dissection or PE However she had multiple pulmonary nodules for which she needs 3 to  44-month follow-up with CT scan.  CT scan also showed groundglass and mosaic attenuation and possibility of  pneumonia however patient has no symptoms suggestive of pneumonia and procalcitonin was unremarkable so antibiotics were not warranted.  She was also hypertensive upon presentation for which she was started on low-dose metoprolol 12.5 mg twice daily due to having heart rate in 60s.  Her blood pressure still is elevated so I am discharging her on 5 mg of amlodipine p.o. daily.  She is feeling much better now so she is going to be discharged in stable condition.  I extensively counseled her regarding quitting smoking and alcohol both.  She verbalized understanding.  Discharge Diagnoses:  Principal Problem:   Chest pain Active Problems:   Essential hypertension   COPD (chronic obstructive pulmonary disease) (HCC)   Tobacco dependence   History of substance abuse (HCC)   Pulmonary nodules/lesions, multiple   Subclinical hypothyroidism    Discharge Instructions  Discharge Instructions    Discharge patient   Complete by: As directed    Discharge disposition: 01-Home or Self Care   Discharge patient date: 02/01/2019     Allergies as of 02/01/2019   No Known Allergies     Medication List    TAKE these medications   albuterol-ipratropium 18-103 MCG/ACT inhaler Commonly known as: Combivent Inhale 2 puffs into the lungs every 6 (six) hours as needed for wheezing or shortness of breath.   Aleve PM 220-25 MG Tabs Generic drug: Naproxen Sod-diphenhydrAMINE Take 2 tablets by mouth at bedtime.   amLODipine 5 MG tablet Commonly known as: NORVASC Take 1 tablet (5 mg total) by  mouth daily.   metoprolol tartrate 25 MG tablet Commonly known as: LOPRESSOR Take 0.5 tablets (12.5 mg total) by mouth 2 (two) times daily.   omeprazole 20 MG tablet Commonly known as: PRILOSEC OTC Take 20 mg by mouth daily.      Follow-up Information    [name], MD Follow up in 1 week(s).    Specialty: Family Medicine Contact information: [address] [place] [address] [phone]          No Known Allergies  Consultations: None   Procedures/Studies: Dg Chest 2 View  Result Date: 01/31/2019 CLINICAL DATA:  Left-sided chest pain and shortness of breath. EXAM: CHEST - 2 VIEW COMPARISON:  Chest x-ray dated April 20, 2016. FINDINGS: The heart size and mediastinal contours are within normal limits. Normal pulmonary vascularity. Chronic diffusely coarsened interstitial markings are similar to prior study and likely smoking-related. No focal consolidation, pleural effusion, or pneumothorax. Bilateral nipple shadows. Small rounded density at the peripheral left lung base corresponds to a overlying EKG electrode. No acute osseous abnormality. IMPRESSION: Chronic smoking related changes without active cardiopulmonary disease. Electronically Signed   By: Obie Dredge M.D.   On: 01/31/2019 11:21   Ct Angio Chest/abd/pel For Dissection W And/or Wo Contrast  Result Date: 01/31/2019 CLINICAL DATA:  Chest pain with pain radiating to left arm and chest area. EXAM: CT ANGIOGRAPHY CHEST, ABDOMEN AND PELVIS TECHNIQUE: Multidetector CT imaging through the chest prior to contrast administration followed by imaging of the chest, abdomen and pelvis performed using the standard protocol during bolus administration of intravenous contrast. Multiplanar reconstructed images and MIPs were obtained and reviewed to evaluate the vascular anatomy. CONTRAST:  OMNIPAQUE IOHEXOL 350 MG/ML SOLN COMPARISON:  Chest x-ray of 01/31/2019 FINDINGS: CTA CHEST FINDINGS Cardiovascular: Left vertebral artery arises directly from the aortic arch. Otherwise standard branching pattern with patent vessels in the chest. No signs of intramural hematoma or evidence of aortic dissection or acute aortic process. Calcified and noncalcified plaque present in the thoracic aorta, maximal aortic diameter at the level of  the aortic arch is 3.7 cm, descending thoracic aorta approximately 2.8 cm. Heart size mild to moderately enlarged. No signs of pericardial effusion. Central pulmonary vasculature is unremarkable. Mediastinum/Nodes: No signs of adenopathy in the chest. Small pre pericardial lymph nodes less than pathologic in terms of overall size. Lungs/Pleura: No signs of effusion, no consolidation. Atelectasis at the lung bases with scattered ground-glass attenuation and signs of mosaic attenuation. Filling defects in right lower lobe bronchi. Some anterior posterior narrowing of bilateral main stem bronchi. Trachea appears normal. 6 mm subpleural nodule in the anterior right chest (image 63, series 9) 4 mm subpleural nodule in the peripheral right chest adjacent to 2 additional nodules the largest of which is a right middle lobe nodule measuring 5 mm (images 94 through 102, series 9) Small nodule in the right upper lobe measures 5 mm (image 36, series 9) Scattered numerous subpleural nodules throughout the left chest with similar distribution mainly in upper mid lung Signs of subpleural reticulation. Musculoskeletal: No signs of chest wall mass. Review of the MIP images confirms the above findings. CTA ABDOMEN AND PELVIS FINDINGS VASCULAR Aorta: Scattered atherosclerosis. No signs of aneurysm or dissection in the abdomen. Celiac: Patent celiac axis with no significant luminal narrowing or aneurysmal dilation. Standard branching pattern. SMA: Patent SMA. Renals: Accessory renal artery on the right to the lower pole arises just distal to the main renal artery. Accessory renal artery on the  left arises just superior to the left renal artery closer to the origin of the SMA. IMA: Patent IMA. Some atherosclerotic plaque in the region of the IMA origin. Inflow: Patent without evidence of aneurysm, dissection, vasculitis or significant stenosis. Veins: Venous phase not performed. Question of portosystemic collaterals in the upper  abdomen. Review of the MIP images confirms the above findings. NON-VASCULAR Hepatobiliary: No sign of focal, suspicious hepatic lesion. However, in this cirrhotic patient only immediate arterial phase was performed. Biliary tree is normal. Pancreas: Unremarkable. No pancreatic ductal dilatation or surrounding inflammatory changes. Spleen: Normal in size without focal abnormality. Adrenals/Urinary Tract: Mild adrenal thickening. No suspicious adrenal lesion. 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 Lower pole moiety with moderate dilation with respect to the ureter on the right but with no signs of hydronephrosis. Moderate renal cortical scarring is evident bilaterally. Stomach/Bowel: No sign of acute gastrointestinal process. Appendix not visualized but no signs of appendiceal inflammation/Peri cecal inflammation. Lymphatic: As above. Atherosclerotic changes extend into mildly tortuous but nonaneurysmal iliac vessels. Scattered upper abdominal lymph nodes. No retroperitoneal adenopathy. No sign of pelvic lymphadenopathy. Reproductive: Uterus grossly normal by CT. Other: Choose 1 Musculoskeletal: No sign of acute bone finding or evidence of destructive bone process. Review of the MIP images confirms the above findings. IMPRESSION: 1. No evidence of aneurysm or dissection in the thoracic or abdominal aorta. Atherosclerotic changes in the thoracic and abdominal aorta. 2. Multiple pulmonary nodules measuring up to 6 mm. Nodules may be even be related to infection or inflammation given below findings. Non-contrast chest CT at 3-6 months is recommended. If the nodules are stable at time of repeat CT, then future CT at 18-24 months (from today's scan) is considered optional for low-risk patients, but is recommended for high-risk patients. This recommendation follows the consensus statement: Guidelines for Management of Incidental Pulmonary Nodules Detected on CT Images: From the Fleischner  Society 2017; Radiology 2017; 284:228-243. 3. Signs of potential bronchomalacia, acquired in the setting of COPD with concomitant bronchopneumonia with endobronchial material in the right lower lobe bronchus on today's scan. 4. Areas of ground-glass and mosaic attenuation may represent a combination of pneumonitis and air trapping. 5. Question portosystemic collaterals in the upper abdomen in the setting of cirrhosis. 6. 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 Renal cortical scarring and right lower pole moiety ureteral dilation may be related to reflux, no current hydronephrosis. Follow-up may be helpful as well as correlation with any history of recurrent urinary tract infections. 7. No other acute abnormalities. Aortic Atherosclerosis (ICD10-I70.0). Electronically Signed   By: Zetta Bills M.D.   On: 01/31/2019 13:41      Discharge Exam: Vitals:   02/01/19 0912 02/01/19 1010  BP: (!) 168/113 (!) 150/91  Pulse: 63 64  Resp: 18 18  Temp:  98.3 F (36.8 C)  SpO2: 96% 97%   Vitals:   02/01/19 0039 02/01/19 0519 02/01/19 0912 02/01/19 1010  BP: 126/90 140/89 (!) 168/113 (!) 150/91  Pulse: 66 65 63 64  Resp: 18 18 18 18   Temp: (!) 97.5 F (36.4 C) (!) 97.4 F (36.3 C)  98.3 F (36.8 C)  TempSrc: Oral Oral  Oral  SpO2: 90% 94% 96% 97%  Weight:  66.7 kg    Height:       I examined patient in presence of her primary RN which functioned as female Producer, television/film/video.  General: Pt is alert, awake, not in acute distress Cardiovascular: RRR, S1/S2 +, no rubs,  no gallops.  Point tenderness under left breast Respiratory: CTA bilaterally, no wheezing, no rhonchi Abdominal: Soft, NT, ND, bowel sounds + Extremities: no edema, no cyanosis    The results of significant diagnostics from this hospitalization (including imaging, microbiology, ancillary and laboratory) are listed below for reference.     Microbiology: Recent Results (from the past 240 hour(s))  SARS  CORONAVIRUS 2 (TAT 6-24 HRS) Nasopharyngeal Nasopharyngeal Swab     Status: None   Collection Time: 01/31/19  3:25 PM   Specimen: Nasopharyngeal Swab  Result Value Ref Range Status   SARS Coronavirus 2 NEGATIVE NEGATIVE Final    Comment: (NOTE) SARS-CoV-2 target nucleic acids are NOT DETECTED. The SARS-CoV-2 RNA is generally detectable in upper and lower respiratory specimens during the acute phase of infection. Negative results do not preclude SARS-CoV-2 infection, do not rule out co-infections with other pathogens, and should not be used as the sole basis for treatment or other patient management decisions. Negative results must be combined with clinical observations, patient history, and epidemiological information. The expected result is Negative. Fact Sheet for Patients: HairSlick.no Fact Sheet for Healthcare Providers: quierodirigir.com This test is not yet approved or cleared by the [place] FDA and  has been authorized for detection and/or diagnosis of SARS-CoV-2 by FDA under an Emergency Use Authorization (EUA). This EUA will remain  in effect (meaning this test can be used) for the duration of the COVID-19 declaration under Section 56 4(b)(1) of the Act, 21 U.S.C. section 360bbb-3(b)(1), unless the authorization is terminated or revoked sooner. Performed at [facility], [address], [place], [place] [address]      Labs: BNP (last 3 results) No results for input(s): BNP in the last 8760 hours. Basic Metabolic Panel: Recent Labs  Lab 01/31/19 1054  NA 132*  K 4.5  CL 100  CO2 20*  GLUCOSE 108*  BUN 14  CREATININE 0.91  CALCIUM 9.0   Liver Function Tests: Recent Labs  Lab 01/31/19 1140  AST 72*  ALT 64*  ALKPHOS 113  BILITOT 1.2  PROT 8.4*  ALBUMIN 4.0   Recent Labs  Lab 01/31/19 1140  LIPASE 32   No results for input(s): AMMONIA in the last 168 hours. CBC: Recent Labs  Lab  01/31/19 1054  WBC 8.9  HGB 15.8*  HCT 46.5*  MCV 90.8  PLT 358   Cardiac Enzymes: No results for input(s): CKTOTAL, CKMB, CKMBINDEX, TROPONINI in the last 168 hours. BNP: Invalid input(s): POCBNP CBG: No results for input(s): GLUCAP in the last 168 hours. D-Dimer No results for input(s): DDIMER in the last 72 hours. Hgb A1c Recent Labs    01/31/19 1843  HGBA1C 5.5   Lipid Profile Recent Labs    02/01/19 0411  CHOL 157  HDL 54  LDLCALC 88  TRIG 74  CHOLHDL 2.9   Thyroid function studies Recent Labs    01/31/19 1843  TSH 7.716*   Anemia work up No results for input(s): VITAMINB12, FOLATE, FERRITIN, TIBC, IRON, RETICCTPCT in the last 72 hours. Urinalysis    Component Value Date/Time   COLORURINE YELLOW (A) 04/20/2016 0710   APPEARANCEUR CLEAR (A) 04/20/2016 0710   APPEARANCEUR Clear 04/22/2013 0408   LABSPEC 1.004 (L) 04/20/2016 0710   LABSPEC 1.026 04/22/2013 0408   PHURINE 7.0 04/20/2016 0710   GLUCOSEU NEGATIVE 04/20/2016 0710   GLUCOSEU Negative 04/22/2013 0408   HGBUR MODERATE (A) 04/20/2016 0710   BILIRUBINUR NEGATIVE 04/20/2016 0710   BILIRUBINUR Negative 04/22/2013 0408  KETONESUR NEGATIVE 04/20/2016 0710   PROTEINUR NEGATIVE 04/20/2016 0710   NITRITE NEGATIVE 04/20/2016 0710   LEUKOCYTESUR TRACE (A) 04/20/2016 0710   LEUKOCYTESUR Negative 04/22/2013 0408   Sepsis Labs Invalid input(s): PROCALCITONIN,  WBC,  LACTICIDVEN Microbiology Recent Results (from the past 240 hour(s))  SARS CORONAVIRUS 2 (TAT 6-24 HRS) Nasopharyngeal Nasopharyngeal Swab     Status: None   Collection Time: 01/31/19  3:25 PM   Specimen: Nasopharyngeal Swab  Result Value Ref Range Status   SARS Coronavirus 2 NEGATIVE NEGATIVE Final    Comment: (NOTE) SARS-CoV-2 target nucleic acids are NOT DETECTED. The SARS-CoV-2 RNA is generally detectable in upper and lower respiratory specimens during the acute phase of infection. Negative results do not preclude SARS-CoV-2  infection, do not rule out co-infections with other pathogens, and should not be used as the sole basis for treatment or other patient management decisions. Negative results must be combined with clinical observations, patient history, and epidemiological information. The expected result is Negative. Fact Sheet for Patients: HairSlick.nohttps://www.fda.gov/media/138098/download Fact Sheet for Healthcare Providers: quierodirigir.comhttps://www.fda.gov/media/138095/download This test is not yet approved or cleared by the [place] FDA and  has been authorized for detection and/or diagnosis of SARS-CoV-2 by FDA under an Emergency Use Authorization (EUA). This EUA will remain  in effect (meaning this test can be used) for the duration of the COVID-19 declaration under Section 56 4(b)(1) of the Act, 21 U.S.C. section 360bbb-3(b)(1), unless the authorization is terminated or revoked sooner. Performed at [facility], [address] [place], [place] [address]      Time coordinating discharge: Over 30 minutes  SIGNED:   Hughie Clossavi Marshun Duva, MD  [facility] 02/01/2019, 1:10 PM  If 7PM-7AM, please contact night-coverage [email or web address] Password [email or web address]

## 2019-02-01 NOTE — TOC Progression Note (Signed)
Transition of Care [facility]) - Progression Note    Patient Details  Name: Kathryn Banks MRN: [id number] Date of Birth: 01-Aug-1955  [facility] of [facility]) CM/SW Contact  Zenon Mayo, RN Phone Number: 02/01/2019, 1:24 PM  Clinical Narrative:    Patient for dc today, NCM assisted patient with medications with [facility], MD sent scripts to [facility]. She has no other needs.        Expected Discharge Plan and Services           Expected Discharge Date: 02/01/19                                     Social Determinants of Health (SDOH) Interventions    Readmission Risk Interventions No flowsheet data found.

## 2019-02-02 LAB — T3, FREE: T3, Free: 3.2 pg/mL (ref 2.0–4.4)

## 2019-03-21 ENCOUNTER — Telehealth: Payer: Self-pay

## 2019-03-23 ENCOUNTER — Other Ambulatory Visit: Payer: Self-pay

## 2019-03-23 ENCOUNTER — Encounter: Payer: Self-pay | Admitting: *Deleted

## 2019-03-23 ENCOUNTER — Ambulatory Visit: Payer: Self-pay | Attending: Oncology | Admitting: *Deleted

## 2019-03-23 ENCOUNTER — Ambulatory Visit
Admission: RE | Admit: 2019-03-23 | Discharge: 2019-03-23 | Disposition: A | Discharge status: Home / Self Care | Payer: Self-pay | Source: Ambulatory Visit | Attending: Oncology | Admitting: Oncology

## 2019-03-23 DIAGNOSIS — Z0000 Encounter for general adult medical examination without abnormal findings: Secondary | ICD-10-CM | POA: Insufficient documentation

## 2019-03-23 NOTE — Progress Notes (Signed)
  Subjective:     Patient ID: Kathryn Banks, female   DOB: June 07, 1955, 63 y.o.   MRN: [id number]  HPI   Review of Systems     Objective:   Physical Exam     Assessment:     Due to Covid 19 a televisit was used to enroll patient into our BCCCP program and complete her health history.  2 identifiers were used to identify patient.  Verbal consent given. Patient denies any breast problems.  Last pap smear was in June 2017, and was negative / negative.   Next pap due in 2022.  See [facility] model breast cancer risk assessment.  Risk Assessment    Risk Scores      03/23/2019   Last edited by: Rico Junker, RN   5-year risk: 1.5 %   Lifetime risk: 7.2 %            Plan:     Screening mammogram ordered.  Will follow up per BCCCP protocol.

## 2019-03-28 ENCOUNTER — Encounter: Payer: Self-pay | Admitting: *Deleted

## 2019-03-28 NOTE — Progress Notes (Signed)
Letter mailed from the [facility] to inform patient of her normal mammogram results.  Patient is to follow-up with annual screening in one year. 

## 2019-06-02 ENCOUNTER — Ambulatory Visit: Payer: Self-pay | Attending: Internal Medicine

## 2019-06-02 DIAGNOSIS — Z23 Encounter for immunization: Secondary | ICD-10-CM | POA: Insufficient documentation

## 2019-06-02 NOTE — Progress Notes (Signed)
   Covid-19 Vaccination Clinic  Name:  Yazlynn Birkeland    MRN: [id number] DOB: [birth year]  06/02/2019  Ms. Brunell was observed post Covid-19 immunization for 15 minutes without incident. She was provided with Vaccine Information Sheet and instruction to access the V-Safe system.   Ms. Bocchino was instructed to call 911 with any severe reactions post vaccine: Marland Kitchen Difficulty breathing  . Swelling of face and throat  . A fast heartbeat  . A bad rash all over body  . Dizziness and weakness   Immunizations Administered    Name Date Dose VIS Date Route   Pfizer COVID-19 Vaccine 06/02/2019 11:59 AM 0.3 mL 03/11/2019 Intramuscular   Manufacturer: [employer], [employer]   Lot: [id number]   NDC: [address]-2

## 2019-06-23 ENCOUNTER — Ambulatory Visit: Payer: Self-pay | Attending: Internal Medicine

## 2019-06-23 DIAGNOSIS — Z23 Encounter for immunization: Secondary | ICD-10-CM

## 2019-06-23 NOTE — Progress Notes (Signed)
   Covid-19 Vaccination Clinic  Name:  Kathryn Banks    MRN: [id number] DOB: Oct 28, 1955  06/23/2019  Kathryn Banks was observed post Covid-19 immunization for 15 minutes without incident. She was provided with Vaccine Information Sheet and instruction to access the V-Safe system.   Kathryn Banks was instructed to call 911 with any severe reactions post vaccine: Marland Kitchen Difficulty breathing  . Swelling of face and throat  . A fast heartbeat  . A bad rash all over body  . Dizziness and weakness   Immunizations Administered    Name Date Dose VIS Date Route   Pfizer COVID-19 Vaccine 06/23/2019  8:36 AM 0.3 mL 03/11/2019 Intramuscular   Manufacturer: [employer], [employer]   Lot: [id number]   NDC: [address]-7

## 2021-10-13 IMAGING — MG DIGITAL SCREENING BILAT W/ TOMO W/ CAD
8 series · 9 of 24 positions shown · non-contrast
Comparison: Previous exam(s).

CLINICAL DATA: Screening.

EXAM:
DIGITAL SCREENING BILATERAL MAMMOGRAM WITH TOMO AND CAD

[R MLO synth-2D]
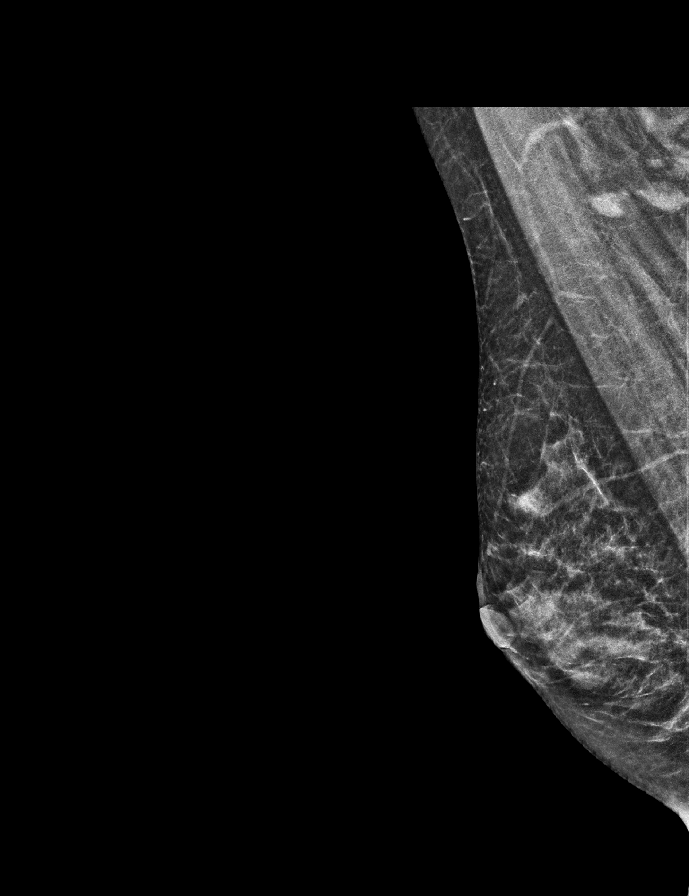

[L CC synth-2D]
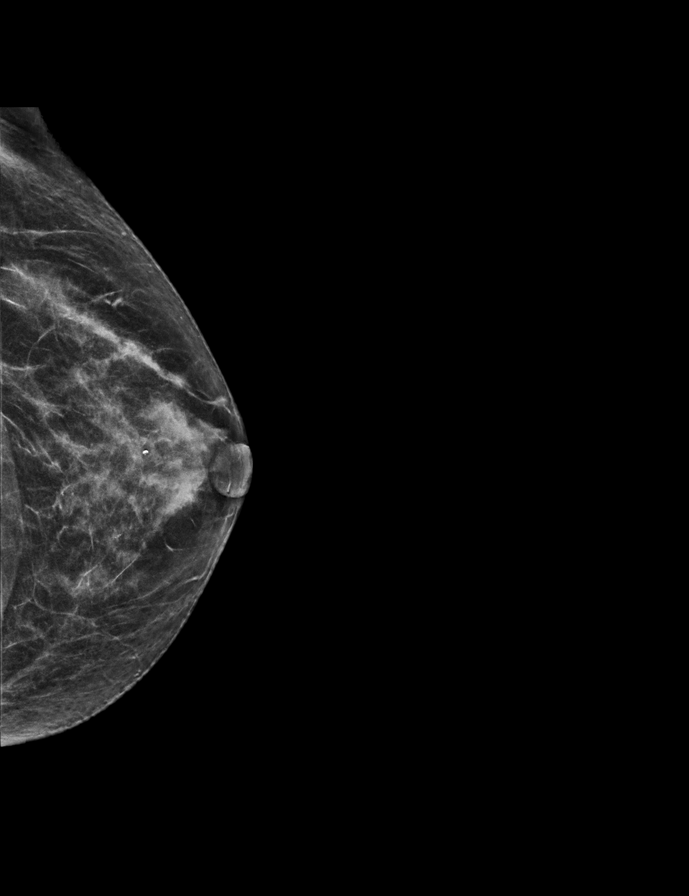

[L MLO synth-2D]
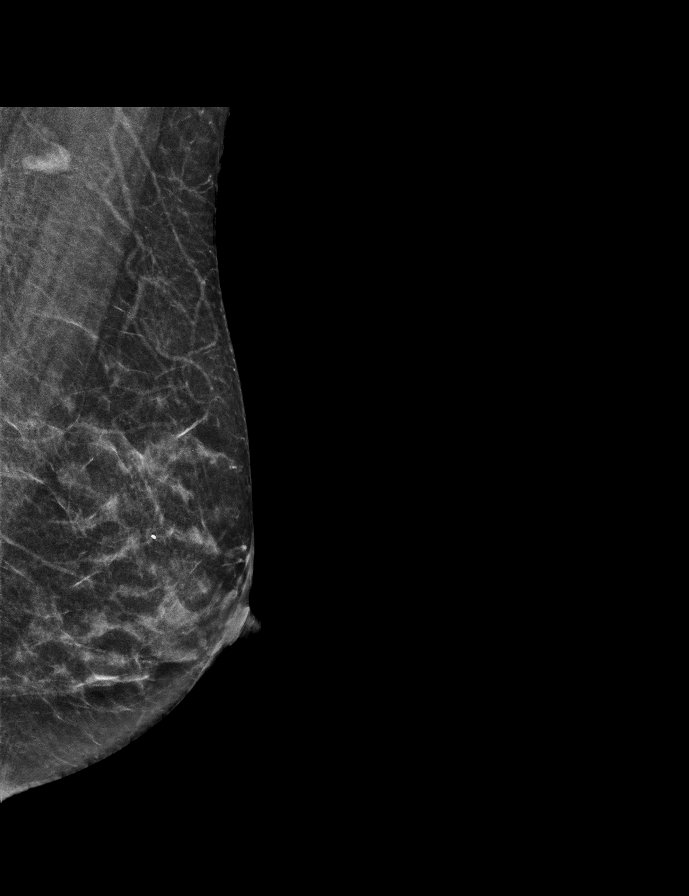

[R CC synth-2D]
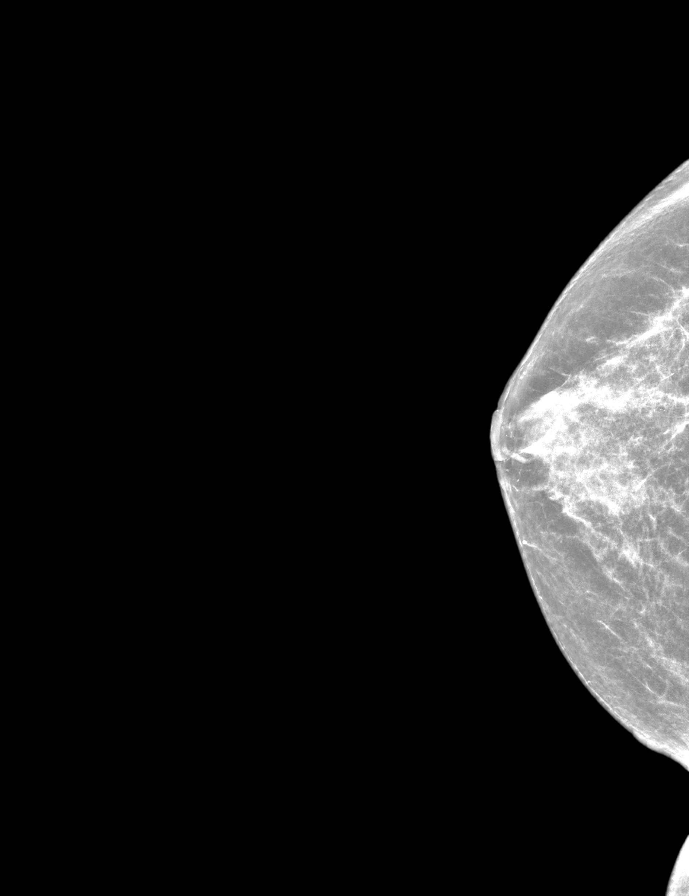

[L CC tomo · 2 of 51 frames shown]
[frame 17/51]
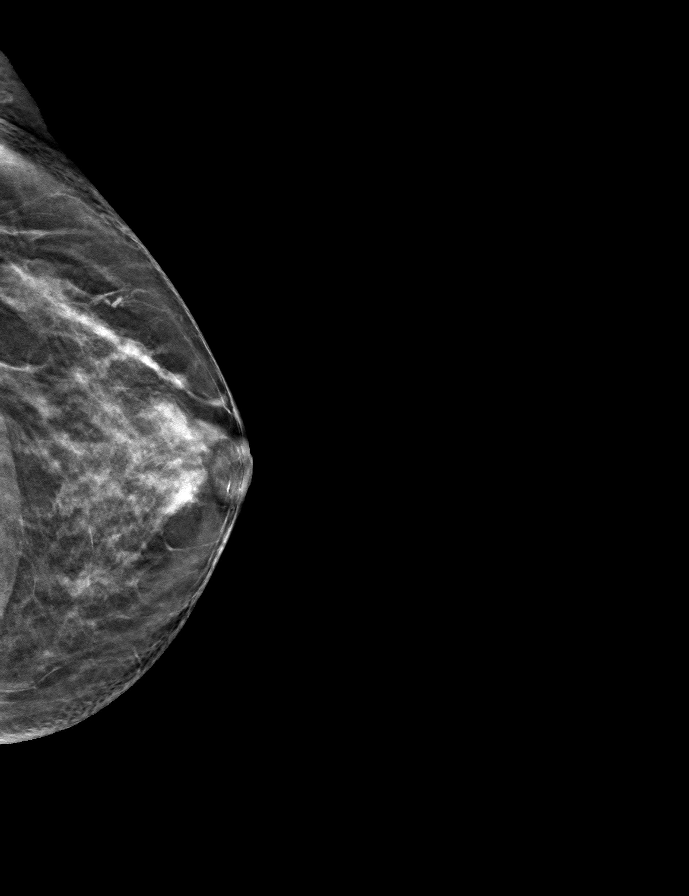
[frame 26/51]
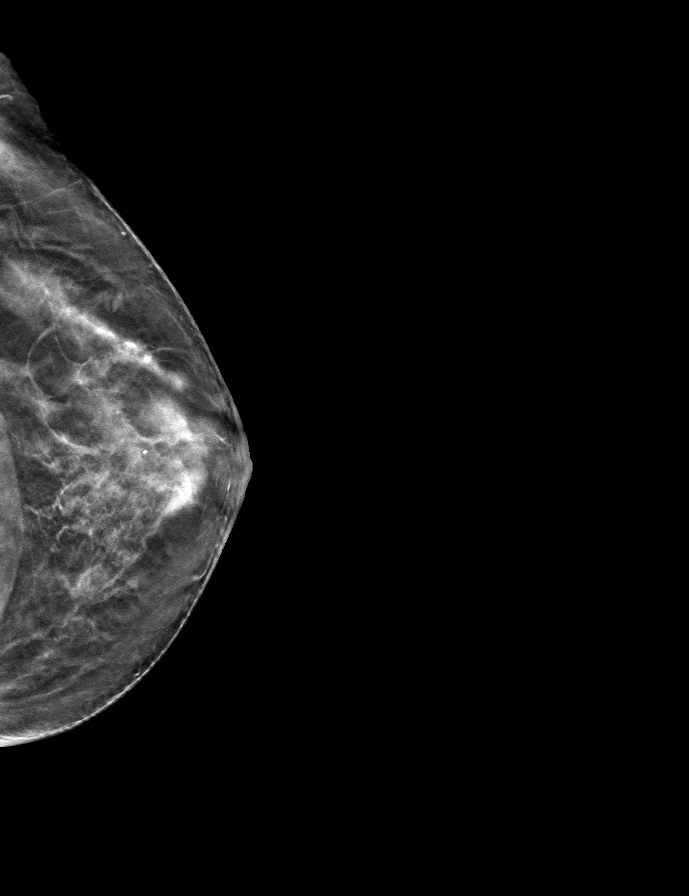

[R CC tomo · tomo slice 24/47.0]
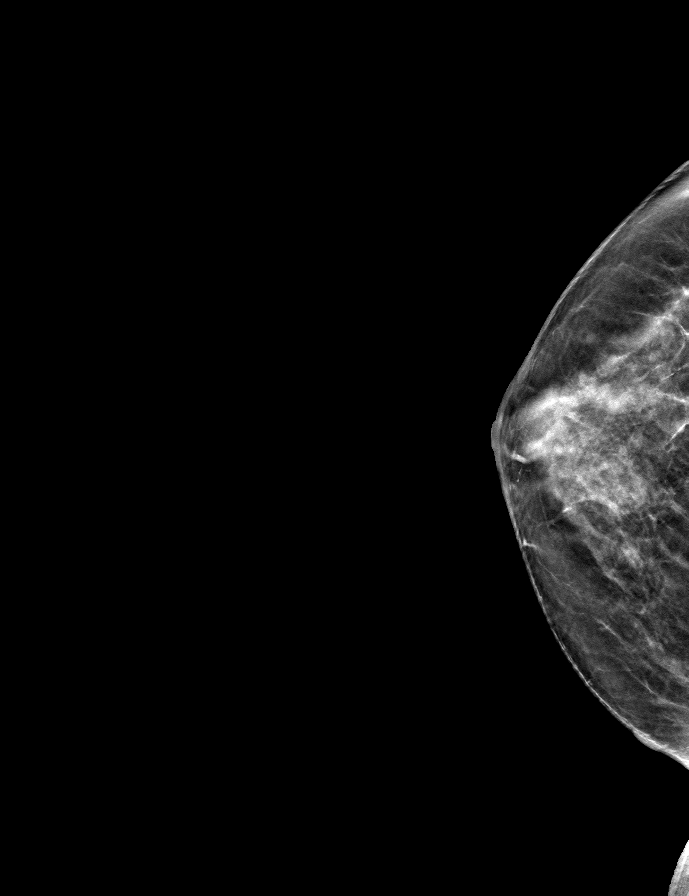

[R MLO tomo · tomo slice 22/43.0]
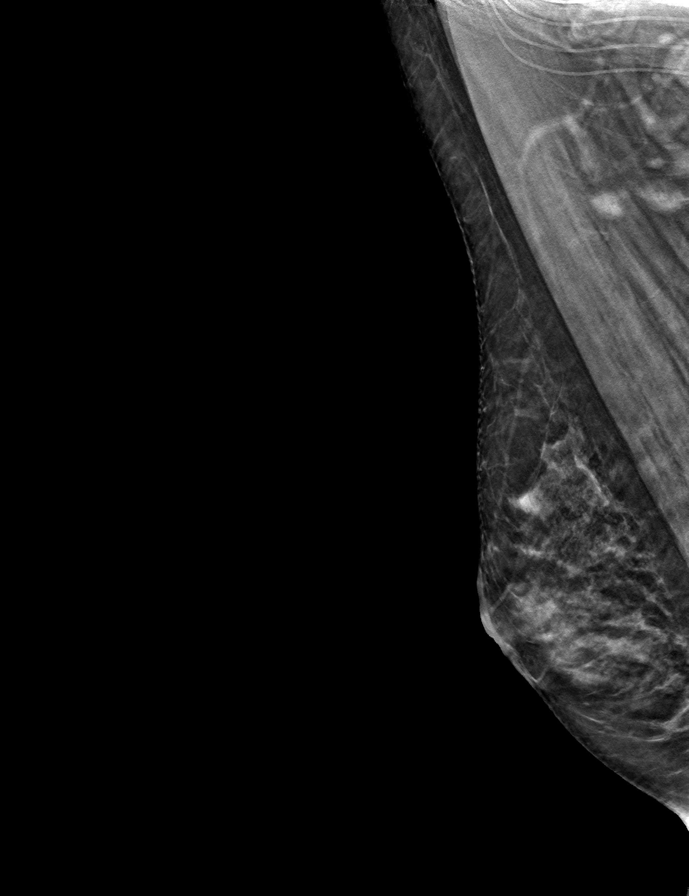

[L MLO tomo · tomo slice 23/46.0]
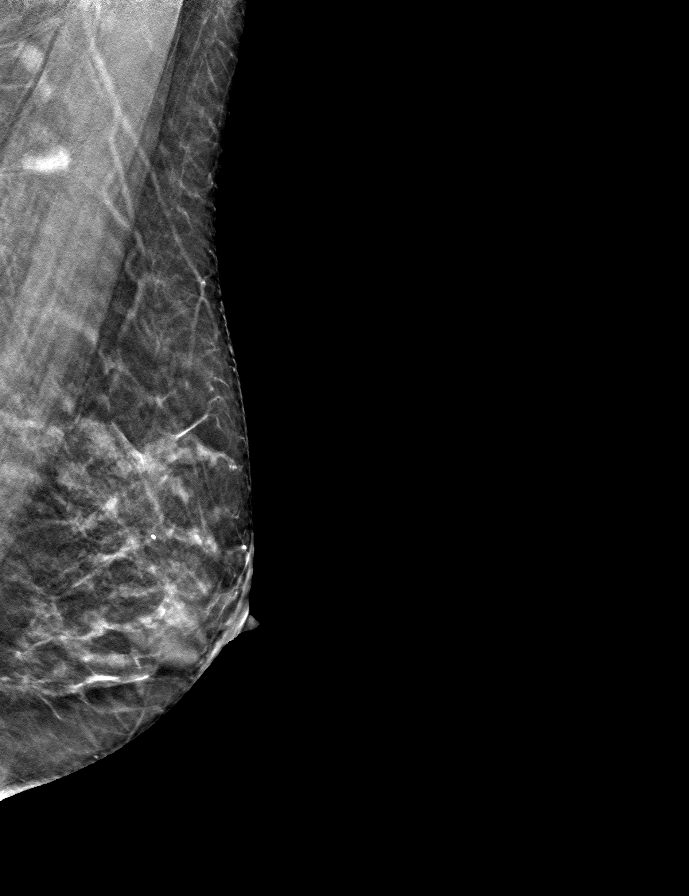

[9 of 24 positions shown; findings below may reference images not displayed]

ACR Breast Density Category c: The breast tissue is heterogeneously
dense, which may obscure small masses.
FINDINGS: There are no findings suspicious for malignancy. Images were
processed with CAD.
IMPRESSION: No mammographic evidence of malignancy. A result letter of this
screening mammogram will be mailed directly to the patient.

RECOMMENDATION:
Screening mammogram in one year. (Code:[id number])

BI-RADS CATEGORY  1: Negative.

## 2022-11-28 ENCOUNTER — Inpatient Hospital Stay (HOSPITAL_COMMUNITY)
Admission: EM | Admit: 2022-11-28 | Discharge: 2022-12-05 | DRG: 190 | Disposition: A | Discharge status: Home / Self Care | Payer: Medicare Other | Attending: Family Medicine | Admitting: Family Medicine

## 2022-11-28 ENCOUNTER — Emergency Department (HOSPITAL_COMMUNITY): Payer: Medicare Other

## 2022-11-28 ENCOUNTER — Other Ambulatory Visit: Payer: Self-pay

## 2022-11-28 ENCOUNTER — Encounter (HOSPITAL_COMMUNITY): Payer: Self-pay | Admitting: *Deleted

## 2022-11-28 ENCOUNTER — Inpatient Hospital Stay (HOSPITAL_COMMUNITY): Payer: Medicare Other

## 2022-11-28 DIAGNOSIS — J810 Acute pulmonary edema: Secondary | ICD-10-CM | POA: Diagnosis not present

## 2022-11-28 DIAGNOSIS — R0902 Hypoxemia: Secondary | ICD-10-CM

## 2022-11-28 DIAGNOSIS — K7030 Alcoholic cirrhosis of liver without ascites: Secondary | ICD-10-CM

## 2022-11-28 DIAGNOSIS — F411 Generalized anxiety disorder: Secondary | ICD-10-CM | POA: Insufficient documentation

## 2022-11-28 DIAGNOSIS — J9621 Acute and chronic respiratory failure with hypoxia: Secondary | ICD-10-CM

## 2022-11-28 DIAGNOSIS — I5033 Acute on chronic diastolic (congestive) heart failure: Secondary | ICD-10-CM | POA: Diagnosis present

## 2022-11-28 DIAGNOSIS — R748 Abnormal levels of other serum enzymes: Secondary | ICD-10-CM | POA: Diagnosis present

## 2022-11-28 DIAGNOSIS — Z23 Encounter for immunization: Secondary | ICD-10-CM

## 2022-11-28 DIAGNOSIS — Z597 Insufficient social insurance and welfare support: Secondary | ICD-10-CM | POA: Diagnosis not present

## 2022-11-28 DIAGNOSIS — Z8619 Personal history of other infectious and parasitic diseases: Secondary | ICD-10-CM | POA: Diagnosis not present

## 2022-11-28 DIAGNOSIS — M19042 Primary osteoarthritis, left hand: Secondary | ICD-10-CM | POA: Diagnosis present

## 2022-11-28 DIAGNOSIS — J180 Bronchopneumonia, unspecified organism: Secondary | ICD-10-CM | POA: Diagnosis present

## 2022-11-28 DIAGNOSIS — F17210 Nicotine dependence, cigarettes, uncomplicated: Secondary | ICD-10-CM | POA: Insufficient documentation

## 2022-11-28 DIAGNOSIS — Z791 Long term (current) use of non-steroidal anti-inflammatories (NSAID): Secondary | ICD-10-CM

## 2022-11-28 DIAGNOSIS — K219 Gastro-esophageal reflux disease without esophagitis: Secondary | ICD-10-CM | POA: Diagnosis present

## 2022-11-28 DIAGNOSIS — R7989 Other specified abnormal findings of blood chemistry: Secondary | ICD-10-CM | POA: Insufficient documentation

## 2022-11-28 DIAGNOSIS — R599 Enlarged lymph nodes, unspecified: Secondary | ICD-10-CM | POA: Diagnosis present

## 2022-11-28 DIAGNOSIS — I5031 Acute diastolic (congestive) heart failure: Secondary | ICD-10-CM | POA: Diagnosis not present

## 2022-11-28 DIAGNOSIS — F1010 Alcohol abuse, uncomplicated: Secondary | ICD-10-CM | POA: Diagnosis present

## 2022-11-28 DIAGNOSIS — E538 Deficiency of other specified B group vitamins: Secondary | ICD-10-CM | POA: Diagnosis present

## 2022-11-28 DIAGNOSIS — F17213 Nicotine dependence, cigarettes, with withdrawal: Secondary | ICD-10-CM | POA: Diagnosis present

## 2022-11-28 DIAGNOSIS — Z8659 Personal history of other mental and behavioral disorders: Secondary | ICD-10-CM

## 2022-11-28 DIAGNOSIS — I10 Essential (primary) hypertension: Secondary | ICD-10-CM | POA: Diagnosis present

## 2022-11-28 DIAGNOSIS — Z79899 Other long term (current) drug therapy: Secondary | ICD-10-CM | POA: Diagnosis not present

## 2022-11-28 DIAGNOSIS — E785 Hyperlipidemia, unspecified: Secondary | ICD-10-CM | POA: Insufficient documentation

## 2022-11-28 DIAGNOSIS — K7460 Unspecified cirrhosis of liver: Secondary | ICD-10-CM | POA: Diagnosis not present

## 2022-11-28 DIAGNOSIS — D509 Iron deficiency anemia, unspecified: Secondary | ICD-10-CM | POA: Insufficient documentation

## 2022-11-28 DIAGNOSIS — J189 Pneumonia, unspecified organism: Principal | ICD-10-CM

## 2022-11-28 DIAGNOSIS — E871 Hypo-osmolality and hyponatremia: Secondary | ICD-10-CM | POA: Diagnosis present

## 2022-11-28 DIAGNOSIS — J9601 Acute respiratory failure with hypoxia: Secondary | ICD-10-CM | POA: Diagnosis not present

## 2022-11-28 DIAGNOSIS — J439 Emphysema, unspecified: Secondary | ICD-10-CM | POA: Diagnosis present

## 2022-11-28 DIAGNOSIS — R012 Other cardiac sounds: Secondary | ICD-10-CM | POA: Diagnosis not present

## 2022-11-28 DIAGNOSIS — I110 Hypertensive heart disease with heart failure: Secondary | ICD-10-CM | POA: Diagnosis present

## 2022-11-28 DIAGNOSIS — Z8249 Family history of ischemic heart disease and other diseases of the circulatory system: Secondary | ICD-10-CM

## 2022-11-28 DIAGNOSIS — Z720 Tobacco use: Secondary | ICD-10-CM | POA: Diagnosis not present

## 2022-11-28 DIAGNOSIS — I700 Atherosclerosis of aorta: Secondary | ICD-10-CM | POA: Diagnosis present

## 2022-11-28 DIAGNOSIS — F32A Depression, unspecified: Secondary | ICD-10-CM | POA: Diagnosis present

## 2022-11-28 DIAGNOSIS — J441 Chronic obstructive pulmonary disease with (acute) exacerbation: Principal | ICD-10-CM | POA: Diagnosis present

## 2022-11-28 DIAGNOSIS — M19041 Primary osteoarthritis, right hand: Secondary | ICD-10-CM | POA: Diagnosis present

## 2022-11-28 DIAGNOSIS — R0602 Shortness of breath: Secondary | ICD-10-CM | POA: Diagnosis present

## 2022-11-28 LAB — HEPATIC FUNCTION PANEL
ALT: 22 U/L (ref 0–44)
AST: 55 U/L — ABNORMAL HIGH (ref 15–41)
Albumin: 2.8 g/dL — ABNORMAL LOW (ref 3.5–5.0)
Alkaline Phosphatase: 168 U/L — ABNORMAL HIGH (ref 38–126)
Bilirubin, Direct: 0.3 mg/dL — ABNORMAL HIGH (ref 0.0–0.2)
Indirect Bilirubin: 0.4 mg/dL (ref 0.3–0.9)
Total Bilirubin: 0.7 mg/dL (ref 0.3–1.2)
Total Protein: 7.5 g/dL (ref 6.5–8.1)

## 2022-11-28 LAB — I-STAT VENOUS BLOOD GAS, ED
Acid-base deficit: 2 mmol/L (ref 0.0–2.0)
Bicarbonate: 22.5 mmol/L (ref 20.0–28.0)
Calcium, Ion: 1.01 mmol/L — ABNORMAL LOW (ref 1.15–1.40)
HCT: 30 % — ABNORMAL LOW (ref 36.0–46.0)
Hemoglobin: 10.2 g/dL — ABNORMAL LOW (ref 12.0–15.0)
O2 Saturation: 98 %
Potassium: 3.7 mmol/L (ref 3.5–5.1)
Sodium: 135 mmol/L (ref 135–145)
TCO2: 24 mmol/L (ref 22–32)
pCO2, Ven: 37.6 mmHg — ABNORMAL LOW (ref 44–60)
pH, Ven: 7.386 (ref 7.25–7.43)
pO2, Ven: 116 mmHg — ABNORMAL HIGH (ref 32–45)

## 2022-11-28 LAB — POC OCCULT BLOOD, ED: Fecal Occult Bld: NEGATIVE

## 2022-11-28 LAB — RETICULOCYTES
Immature Retic Fract: 21.4 % — ABNORMAL HIGH (ref 2.3–15.9)
RBC.: 3.94 MIL/uL (ref 3.87–5.11)
Retic Count, Absolute: 71.3 10*3/uL (ref 19.0–186.0)
Retic Ct Pct: 1.8 % (ref 0.4–3.1)

## 2022-11-28 LAB — IRON AND TIBC
Iron: 19 ug/dL — ABNORMAL LOW (ref 28–170)
Saturation Ratios: 4 % — ABNORMAL LOW (ref 10.4–31.8)
TIBC: 470 ug/dL — ABNORMAL HIGH (ref 250–450)
UIBC: 451 ug/dL

## 2022-11-28 LAB — CBC
HCT: 30.6 % — ABNORMAL LOW (ref 36.0–46.0)
Hemoglobin: 8.8 g/dL — ABNORMAL LOW (ref 12.0–15.0)
MCH: 22.2 pg — ABNORMAL LOW (ref 26.0–34.0)
MCHC: 28.8 g/dL — ABNORMAL LOW (ref 30.0–36.0)
MCV: 77.1 fL — ABNORMAL LOW (ref 80.0–100.0)
Platelets: 370 10*3/uL (ref 150–400)
RBC: 3.97 MIL/uL (ref 3.87–5.11)
RDW: 21.1 % — ABNORMAL HIGH (ref 11.5–15.5)
WBC: 9 10*3/uL (ref 4.0–10.5)
nRBC: 0 % (ref 0.0–0.2)

## 2022-11-28 LAB — BASIC METABOLIC PANEL
Anion gap: 17 — ABNORMAL HIGH (ref 5–15)
BUN: 5 mg/dL — ABNORMAL LOW (ref 8–23)
CO2: 22 mmol/L (ref 22–32)
Calcium: 8.3 mg/dL — ABNORMAL LOW (ref 8.9–10.3)
Chloride: 92 mmol/L — ABNORMAL LOW (ref 98–111)
Creatinine, Ser: 0.73 mg/dL (ref 0.44–1.00)
GFR, Estimated: >60 mL/min (ref >60)
Glucose, Bld: 91 mg/dL (ref 70–99)
Potassium: 3.6 mmol/L (ref 3.5–5.1)
Sodium: 131 mmol/L — ABNORMAL LOW (ref 135–145)

## 2022-11-28 LAB — FERRITIN: Ferritin: 20 ng/mL (ref 11–307)

## 2022-11-28 LAB — TROPONIN I (HIGH SENSITIVITY)
Troponin I (High Sensitivity): 7 ng/L (ref <18)
Troponin I (High Sensitivity): 6 ng/L (ref <18)

## 2022-11-28 LAB — FOLATE: Folate: 2.2 ng/mL — ABNORMAL LOW (ref >5.9)

## 2022-11-28 LAB — BRAIN NATRIURETIC PEPTIDE: B Natriuretic Peptide: 355.4 pg/mL — ABNORMAL HIGH (ref 0.0–100.0)

## 2022-11-28 MED ORDER — PANTOPRAZOLE SODIUM 40 MG PO TBEC
40.0000 mg | DELAYED_RELEASE_TABLET | Freq: Every day | ORAL | Status: DC
  Administered 2022-11-29 – 2022-12-05 (×7): 40 mg via ORAL
  Filled 2022-11-28 (×7): qty 1

## 2022-11-28 MED ORDER — SODIUM CHLORIDE 0.9 % IV SOLN
1.0000 g | INTRAVENOUS | Status: DC
  Administered 2022-11-29 – 2022-12-02 (×4): 1 g via INTRAVENOUS
  Filled 2022-11-28 (×4): qty 10

## 2022-11-28 MED ORDER — SODIUM CHLORIDE 0.9% FLUSH
3.0000 mL | Freq: Two times a day (BID) | INTRAVENOUS | Status: DC
  Administered 2022-11-29 – 2022-12-05 (×14): 3 mL via INTRAVENOUS

## 2022-11-28 MED ORDER — SENNOSIDES-DOCUSATE SODIUM 8.6-50 MG PO TABS
1.0000 | ORAL_TABLET | Freq: Every evening | ORAL | Status: DC | PRN
  Administered 2022-12-02 – 2022-12-03 (×2): 1 via ORAL
  Filled 2022-11-28 (×2): qty 1

## 2022-11-28 MED ORDER — METHYLPREDNISOLONE SODIUM SUCC 40 MG IJ SOLR
40.0000 mg | INTRAMUSCULAR | Status: DC
  Filled 2022-11-28: qty 1

## 2022-11-28 MED ORDER — SODIUM CHLORIDE 0.9 % IV SOLN
1.0000 g | Freq: Once | INTRAVENOUS | Status: AC
  Administered 2022-11-28: 1 g via INTRAVENOUS
  Filled 2022-11-28: qty 10

## 2022-11-28 MED ORDER — ONDANSETRON HCL 4 MG/2ML IJ SOLN: 4.0000 mg | Freq: Four times a day (QID) | INTRAMUSCULAR | Status: DC | PRN

## 2022-11-28 MED ORDER — IPRATROPIUM-ALBUTEROL 0.5-2.5 (3) MG/3ML IN SOLN
3.0000 mL | Freq: Four times a day (QID) | RESPIRATORY_TRACT | Status: DC
  Administered 2022-11-29 – 2022-12-04 (×21): 3 mL via RESPIRATORY_TRACT
  Filled 2022-11-28 (×20): qty 3

## 2022-11-28 MED ORDER — AMLODIPINE BESYLATE 5 MG PO TABS
5.0000 mg | ORAL_TABLET | Freq: Every day | ORAL | Status: DC
  Administered 2022-11-29 – 2022-12-05 (×7): 5 mg via ORAL
  Filled 2022-11-28 (×7): qty 1

## 2022-11-28 MED ORDER — GUAIFENESIN ER 600 MG PO TB12
600.0000 mg | ORAL_TABLET | Freq: Two times a day (BID) | ORAL | Status: DC | PRN
  Administered 2022-12-01 (×2): 600 mg via ORAL
  Filled 2022-11-28 (×2): qty 1

## 2022-11-28 MED ORDER — ATORVASTATIN CALCIUM 10 MG PO TABS
10.0000 mg | ORAL_TABLET | Freq: Every day | ORAL | Status: DC
  Administered 2022-11-29 (×2): 10 mg via ORAL
  Filled 2022-11-28 (×2): qty 1

## 2022-11-28 MED ORDER — NICOTINE 21 MG/24HR TD PT24
21.0000 mg | MEDICATED_PATCH | Freq: Every day | TRANSDERMAL | Status: DC
  Administered 2022-11-28 – 2022-12-03 (×6): 21 mg via TRANSDERMAL
  Filled 2022-11-28 (×6): qty 1

## 2022-11-28 MED ORDER — SODIUM CHLORIDE 0.9 % IV SOLN
500.0000 mg | Freq: Once | INTRAVENOUS | Status: AC
  Administered 2022-11-29: 500 mg via INTRAVENOUS
  Filled 2022-11-28: qty 5

## 2022-11-28 MED ORDER — ACETAMINOPHEN 500 MG PO TABS
1000.0000 mg | ORAL_TABLET | Freq: Once | ORAL | Status: DC
  Administered 2022-11-28: 1000 mg via ORAL
  Filled 2022-11-28: qty 2

## 2022-11-28 MED ORDER — ALPRAZOLAM 0.25 MG PO TABS
0.2500 mg | ORAL_TABLET | Freq: Three times a day (TID) | ORAL | Status: DC | PRN
  Administered 2022-11-28 – 2022-12-04 (×14): 0.25 mg via ORAL
  Filled 2022-11-28 (×15): qty 1

## 2022-11-28 MED ORDER — HYDRALAZINE HCL 20 MG/ML IJ SOLN: 5.0000 mg | Freq: Four times a day (QID) | INTRAMUSCULAR | Status: DC | PRN

## 2022-11-28 MED ORDER — NICOTINE POLACRILEX 2 MG MT GUM
2.0000 mg | CHEWING_GUM | OROMUCOSAL | Status: DC | PRN
  Filled 2022-11-28: qty 1

## 2022-11-28 MED ORDER — SODIUM CHLORIDE 0.9 % IV SOLN: 250.0000 mL | INTRAVENOUS | Status: DC | PRN

## 2022-11-28 MED ORDER — SODIUM CHLORIDE 0.9 % IV SOLN
500.0000 mg | INTRAVENOUS | Status: DC
  Administered 2022-11-29: 500 mg via INTRAVENOUS
  Filled 2022-11-28: qty 5

## 2022-11-28 MED ORDER — ACETAMINOPHEN 650 MG RE SUPP: 650.0000 mg | Freq: Four times a day (QID) | RECTAL | Status: DC | PRN

## 2022-11-28 MED ORDER — ENOXAPARIN SODIUM 40 MG/0.4ML IJ SOSY: 40.0000 mg | PREFILLED_SYRINGE | INTRAMUSCULAR | Status: DC

## 2022-11-28 MED ORDER — FERROUS SULFATE 325 (65 FE) MG PO TABS
325.0000 mg | ORAL_TABLET | Freq: Every day | ORAL | Status: DC
  Administered 2022-11-29 – 2022-11-30 (×2): 325 mg via ORAL
  Filled 2022-11-28 (×2): qty 1

## 2022-11-28 MED ORDER — BENZONATATE 100 MG PO CAPS
100.0000 mg | ORAL_CAPSULE | Freq: Three times a day (TID) | ORAL | Status: DC | PRN
  Administered 2022-11-29 – 2022-12-05 (×6): 100 mg via ORAL
  Filled 2022-11-28 (×7): qty 1

## 2022-11-28 MED ORDER — METHYLPREDNISOLONE SODIUM SUCC 125 MG IJ SOLR
125.0000 mg | INTRAMUSCULAR | Status: AC
  Administered 2022-11-28: 125 mg via INTRAVENOUS
  Filled 2022-11-28: qty 2

## 2022-11-28 MED ORDER — IOHEXOL 350 MG/ML SOLN
75.0000 mL | Freq: Once | INTRAVENOUS | Status: AC | PRN
  Administered 2022-11-28: 75 mL via INTRAVENOUS

## 2022-11-28 MED ORDER — SODIUM CHLORIDE 0.9% FLUSH: 3.0000 mL | INTRAVENOUS | Status: DC | PRN

## 2022-11-28 MED ORDER — OMEPRAZOLE MAGNESIUM 20 MG PO TBEC: 20.0000 mg | DELAYED_RELEASE_TABLET | Freq: Every day | ORAL | Status: DC

## 2022-11-28 MED ORDER — NICOTINE 21 MG/24HR TD PT24: 21.0000 mg | MEDICATED_PATCH | Freq: Every day | TRANSDERMAL | Status: DC

## 2022-11-28 MED ORDER — LEVALBUTEROL HCL 0.63 MG/3ML IN NEBU: 0.6300 mg | INHALATION_SOLUTION | Freq: Four times a day (QID) | RESPIRATORY_TRACT | Status: DC | PRN

## 2022-11-28 MED ORDER — SERTRALINE HCL 50 MG PO TABS
25.0000 mg | ORAL_TABLET | Freq: Every day | ORAL | Status: DC
  Administered 2022-11-29 – 2022-12-04 (×7): 25 mg via ORAL
  Filled 2022-11-28 (×7): qty 1

## 2022-11-28 MED ORDER — METOPROLOL TARTRATE 12.5 MG HALF TABLET
12.5000 mg | ORAL_TABLET | Freq: Two times a day (BID) | ORAL | Status: DC
  Administered 2022-11-29 – 2022-12-05 (×14): 12.5 mg via ORAL
  Filled 2022-11-28 (×14): qty 1

## 2022-11-28 MED ORDER — ENOXAPARIN SODIUM 40 MG/0.4ML IJ SOSY
40.0000 mg | PREFILLED_SYRINGE | INTRAMUSCULAR | Status: DC
  Administered 2022-11-29 – 2022-12-05 (×7): 40 mg via SUBCUTANEOUS
  Filled 2022-11-28 (×7): qty 0.4

## 2022-11-28 MED ORDER — IPRATROPIUM-ALBUTEROL 0.5-2.5 (3) MG/3ML IN SOLN
3.0000 mL | Freq: Once | RESPIRATORY_TRACT | Status: AC
  Administered 2022-11-28: 3 mL via RESPIRATORY_TRACT
  Filled 2022-11-28: qty 3

## 2022-11-28 MED ORDER — ONDANSETRON HCL 4 MG PO TABS: 4.0000 mg | ORAL_TABLET | Freq: Four times a day (QID) | ORAL | Status: DC | PRN

## 2022-11-28 MED ORDER — ACETAMINOPHEN 325 MG PO TABS
650.0000 mg | ORAL_TABLET | Freq: Four times a day (QID) | ORAL | Status: DC | PRN
  Administered 2022-11-29 – 2022-12-02 (×2): 650 mg via ORAL
  Filled 2022-11-28 (×2): qty 2

## 2022-11-28 NOTE — ED Notes (Signed)
The pt is currently sob and is hyperventilating her breathing is better when she answers questions  she reports that she wakes up at night and cannot get her breath.    No home 02

## 2022-11-28 NOTE — ED Notes (Signed)
ED TO INPATIENT HANDOFF REPORT  ED Nurse Name and Phone #: Donny Pique, RN [phone]  S Name/Age/Gender Kathryn Banks 67 y.o. female Room/Bed: 001C/001C  Code Status   Code Status: Full Code  Home/SNF/Other Home Patient oriented to: self, place, time, and situation Is this baseline? Yes   Triage Complete: Triage complete  Chief Complaint COPD with acute exacerbation (HCC) [J44.1]  Triage Note The pt reports that she has been sob for the past 2 weeks she is a smoker and she does still smoke  she is very anxious  no pain   Allergies No Known Allergies  Level of Care/Admitting Diagnosis ED Disposition     ED Disposition  Admit   Condition  --   Comment  Hospital [facility]: [facility] [[id number]]  Level of Care: Telemetry Medical [104]  May admit patient to [facility] or [facility] if equivalent level of care is available:: No  Covid Evaluation: Symptomatic Person Under Investigation (PUI) or recent exposure (last 10 days) *Testing Required*  Diagnosis: COPD with acute exacerbation [facility]) [[id number]]  Admitting Physician: Tereasa Coop [[id number]]  Attending Physician: Tereasa Coop [[id number]]  Certification:: I certify this patient will need inpatient services for at least 2 midnights  Expected Medical Readiness: 12/03/2022          B Medical/Surgery History Past Medical History:  Diagnosis Date   Acid reflux    Arthritis    FINGERS   COPD (chronic obstructive pulmonary disease) (HCC)    Depression    ETOH abuse    Hepatitis    C-PT STATED SHE WAS TOLD THIS 2014   Hypertension    OFF MEDS FOR FEW YEARS   Past Surgical History:  Procedure Laterality Date   CESAREAN SECTION     OPEN REDUCTION INTERNAL FIXATION (ORIF) DISTAL RADIAL FRACTURE Right 10/15/2017   Procedure: OPEN REDUCTION INTERNAL FIXATION (ORIF) DISTAL RADIAL FRACTURE;  Surgeon: Kennedy Bucker, MD;  Location: [facility] ORS;  Service: Orthopedics;  Laterality: Right;     A IV  Location/Drains/Wounds Patient Lines/Drains/Airways Status     Active Line/Drains/Airways     Name Placement date Placement time Site Days   Peripheral IV 11/28/22 20 G Left;Posterior Forearm 11/28/22  1907  Forearm  less than 1   Incision (Closed) 10/15/17 Hand Right 10/15/17  1253  -- 1870            Intake/Output Last 24 hours No intake or output data in the 24 hours ending 11/28/22 2208  Labs/Imaging Results for orders placed or performed during the hospital encounter of 11/28/22 (from the past 48 hour(s))  Basic metabolic panel     Status: Abnormal   Collection Time: 11/28/22  6:11 PM  Result Value Ref Range   Sodium 131 (L) 135 - 145 mmol/L   Potassium 3.6 3.5 - 5.1 mmol/L   Chloride 92 (L) 98 - 111 mmol/L   CO2 22 22 - 32 mmol/L   Glucose, Bld 91 70 - 99 mg/dL    Comment: Glucose reference range applies only to samples taken after fasting for at least 8 hours.   BUN 5 (L) 8 - 23 mg/dL   Creatinine, Ser 7.84 0.44 - 1.00 mg/dL   Calcium 8.3 (L) 8.9 - 10.3 mg/dL   GFR, Estimated >69 >62 mL/min    Comment: (NOTE) Calculated using the CKD-EPI Creatinine Equation (2021)    Anion gap 17 (H) 5 - 15    Comment: Performed at [facility], [address]  [place], [place] [address]  CBC     Status: Abnormal   Collection Time: 11/28/22  6:11 PM  Result Value Ref Range   WBC 9.0 4.0 - 10.5 K/uL   RBC 3.97 3.87 - 5.11 MIL/uL   Hemoglobin 8.8 (L) 12.0 - 15.0 g/dL    Comment: Reticulocyte Hemoglobin testing may be clinically indicated, consider ordering this additional test [id number]    HCT 30.6 (L) 36.0 - 46.0 %   MCV 77.1 (L) 80.0 - 100.0 fL   MCH 22.2 (L) 26.0 - 34.0 pg   MCHC 28.8 (L) 30.0 - 36.0 g/dL   RDW 44.0 (H) 34.7 - 42.5 %   Platelets 370 150 - 400 K/uL   nRBC 0.0 0.0 - 0.2 %    Comment: Performed at [facility], [address], [place], [place] [address]  Troponin I (High Sensitivity)     Status: None   Collection Time: 11/28/22  6:11 PM   Result Value Ref Range   Troponin I (High Sensitivity) 7 <18 ng/L    Comment: (NOTE) Elevated high sensitivity troponin I (hsTnI) values and significant  changes across serial measurements may suggest ACS but many other  chronic and acute conditions are known to elevate hsTnI results.  Refer to the "Links" section for chest pain algorithms and additional  guidance. Performed at [facility], [address] [place], [place] [address]   Brain natriuretic peptide     Status: Abnormal   Collection Time: 11/28/22  7:15 PM  Result Value Ref Range   B Natriuretic Peptide 355.4 (H) 0.0 - 100.0 pg/mL    Comment: Performed at [facility], [address] [place], [place] [address]  Troponin I (High Sensitivity)     Status: None   Collection Time: 11/28/22  8:11 PM  Result Value Ref Range   Troponin I (High Sensitivity) 6 <18 ng/L    Comment: (NOTE) Elevated high sensitivity troponin I (hsTnI) values and significant  changes across serial measurements may suggest ACS but many other  chronic and acute conditions are known to elevate hsTnI results.  Refer to the "Links" section for chest pain algorithms and additional  guidance. Performed at [facility], [address] [place], [place] [address]   POC occult blood, ED     Status: None   Collection Time: 11/28/22  8:27 PM  Result Value Ref Range   Fecal Occult Bld NEGATIVE NEGATIVE  I-Stat venous blood gas, ([facility] ED, [facility], [facility])     Status: Abnormal   Collection Time: 11/28/22  8:35 PM  Result Value Ref Range   pH, Ven 7.386 7.25 - 7.43   pCO2, Ven 37.6 (L) 44 - 60 mmHg   pO2, Ven 116 (H) 32 - 45 mmHg   Bicarbonate 22.5 20.0 - 28.0 mmol/L   TCO2 24 22 - 32 mmol/L   O2 Saturation 98 %   Acid-base deficit 2.0 0.0 - 2.0 mmol/L   Sodium 135 135 - 145 mmol/L   Potassium 3.7 3.5 - 5.1 mmol/L   Calcium, Ion 1.01 (L) 1.15 - 1.40 mmol/L   HCT 30.0 (L) 36.0 - 46.0 %   Hemoglobin 10.2 (L) 12.0 - 15.0 g/dL   Sample type  VENOUS    CT Angio Chest PE W and/or Wo Contrast  Result Date: 11/28/2022 CLINICAL DATA:  Shortness of breath.  PE suspected EXAM: CT ANGIOGRAPHY CHEST WITH CONTRAST TECHNIQUE: Multidetector CT imaging of the chest was performed using the  standard protocol during bolus administration of intravenous contrast. Multiplanar CT image reconstructions and MIPs were obtained to evaluate the vascular anatomy. RADIATION DOSE REDUCTION: This exam was performed according to the departmental dose-optimization program which includes automated exposure control, adjustment of the mA and/or kV according to patient size and/or use of iterative reconstruction technique. CONTRAST:  75mL OMNIPAQUE IOHEXOL 350 MG/ML SOLN COMPARISON:  Chest radiograph 11/28/2022 and CTA chest 01/31/2019 FINDINGS: Cardiovascular: Negative for acute pulmonary embolism. Normal caliber thoracic aorta. No pericardial effusion. Coronary artery and aortic atherosclerotic calcification. Mediastinum/Nodes: Layering debris in the trachea. Unremarkable esophagus. Prominent mediastinal and hilar nodes. For example 11 mm right hilar node on 06/183. Lungs/Pleura: Layering debris in the right mainstem bronchus. Additional mucous plugging/debris in the right lower lobe bronchi. Patchy ground-glass and consolidative opacities with interlobular septal thickening bilaterally greatest in the right lower lobe. No pleural effusion or pneumothorax. Mosaic attenuation compatible with air trapping. Biapical pleural-parenchymal scarring. Multiple predominantly subpleural pulmonary nodules measuring up to 6 mm are similar to 2020. No follow-up recommended. Upper Abdomen: Nodular contour of the liver compatible with cirrhosis. No acute abnormality. Musculoskeletal: No acute fracture. Review of the MIP images confirms the above findings. IMPRESSION: 1. Negative for acute pulmonary embolism. 2. Right lower lobe bronchopneumonia. 3. Cirrhosis. Aortic Atherosclerosis (ICD10-I70.0).  Electronically Signed   By: Minerva Fester M.D.   On: 11/28/2022 20:33   DG Chest 2 View  Result Date: 11/28/2022 CLINICAL DATA:  COPD. Shortness of breath with low oxygen saturations. EXAM: CHEST - 2 VIEW COMPARISON:  01/31/2019 FINDINGS: Borderline heart size with normal pulmonary vascularity. Emphysematous changes in the lungs. Hazy interstitial pattern consistent with respiratory bronchiolitis. No consolidation or edema. No pleural effusions. No pneumothorax. Mediastinal contours appear intact. Calcified and tortuous aorta. Degenerative changes in the spine. IMPRESSION: Emphysematous and chronic bronchitic changes in the lungs. No focal consolidation. Electronically Signed   By: Burman Nieves M.D.   On: 11/28/2022 19:19    Pending Labs Unresulted Labs (From admission, onward)     Start     Ordered   11/29/22 0500  Comprehensive metabolic panel  Daily,   R      11/28/22 2115   11/29/22 0500  CBC  Daily,   R      11/28/22 2115   11/28/22 2136  Vitamin B12  (Anemia Panel (PNL))  Add-on,   AD        11/28/22 2135   11/28/22 2136  Folate  (Anemia Panel (PNL))  Add-on,   AD        11/28/22 2135   11/28/22 2136  Iron and TIBC  (Anemia Panel (PNL))  Add-on,   AD        11/28/22 2135   11/28/22 2136  Ferritin  (Anemia Panel (PNL))  Add-on,   AD        11/28/22 2135   11/28/22 2136  Reticulocytes  (Anemia Panel (PNL))  Add-on,   AD        11/28/22 2135   11/28/22 2134  Strep pneumoniae urinary antigen  Once,   R        11/28/22 2133   11/28/22 2134  Legionella Pneumophila Serogp 1 Ur Ag  Once,   R        11/28/22 2133   11/28/22 2128  Hepatic function panel  Add-on,   AD        11/28/22 2127   11/28/22 2128  Protime-INR  Add-on,   AD  11/28/22 2127   11/28/22 2116  HIV Antibody (routine testing w rflx)  (HIV Antibody (Routine testing w reflex) panel)  Once,   R        11/28/22 2115   11/28/22 2116  Respiratory (~20 pathogens) panel by PCR  (COPD / Pneumonia / Cellulitis / Lower  Extremity Wound)  Once,   R        11/28/22 2115   11/28/22 2116  MRSA Next Gen by PCR, Nasal  (COPD / Pneumonia / Cellulitis / Lower Extremity Wound)  Once,   R        11/28/22 2115   11/28/22 2116  Culture, blood (Routine X 2) w Reflex to ID Panel  (COPD / Pneumonia / Cellulitis / Lower Extremity Wound)  BLOOD CULTURE X 2,   R (with TIMED occurrences)      11/28/22 2115   11/28/22 2116  Expectorated Sputum Assessment w Gram Stain, Rflx to Resp Cult  (COPD / Pneumonia / Cellulitis / Lower Extremity Wound)  Once,   R        11/28/22 2115   11/28/22 1931  Occult blood card to lab, stool  Once,   URGENT        11/28/22 1930            Vitals/Pain Today's Vitals   11/28/22 1800 11/28/22 1803 11/28/22 1804 11/28/22 2026  BP:   124/77   Pulse:   81   Resp:   (!) 22   Temp:   98.3 F (36.8 C) 99.9 F (37.7 C)  TempSrc:   Oral Rectal  SpO2: (!) 79%  92%   Weight:  66.7 kg    Height:  5\' 4"  (1.626 m)    PainSc:  0-No pain      Isolation Precautions Droplet precaution  Medications Medications  nicotine polacrilex (NICORETTE) gum 2 mg (has no administration in time range)  nicotine (NICODERM CQ - dosed in mg/24 hours) patch 21 mg (21 mg Transdermal Patch Applied 11/28/22 2105)  azithromycin (ZITHROMAX) 500 mg in sodium chloride 0.9 % 250 mL IVPB (has no administration in time range)  cefTRIAXone (ROCEPHIN) 1 g in sodium chloride 0.9 % 100 mL IVPB (has no administration in time range)  azithromycin (ZITHROMAX) 500 mg in sodium chloride 0.9 % 250 mL IVPB (has no administration in time range)  methylPREDNISolone sodium succinate (SOLU-MEDROL) 40 mg/mL injection 40 mg (has no administration in time range)  ipratropium-albuterol (DUONEB) 0.5-2.5 (3) MG/3ML nebulizer solution 3 mL (has no administration in time range)  levalbuterol (XOPENEX) nebulizer solution 0.63 mg (has no administration in time range)  benzonatate (TESSALON) capsule 100 mg (has no administration in time range)   guaiFENesin (MUCINEX) 12 hr tablet 600 mg (has no administration in time range)  sodium chloride flush (NS) 0.9 % injection 3 mL (has no administration in time range)  sodium chloride flush (NS) 0.9 % injection 3 mL (has no administration in time range)  0.9 %  sodium chloride infusion (has no administration in time range)  acetaminophen (TYLENOL) tablet 650 mg (has no administration in time range)    Or  acetaminophen (TYLENOL) suppository 650 mg (has no administration in time range)  senna-docusate (Senokot-S) tablet 1 tablet (has no administration in time range)  ondansetron (ZOFRAN) tablet 4 mg (has no administration in time range)    Or  ondansetron (ZOFRAN) injection 4 mg (has no administration in time range)  hydrALAZINE (APRESOLINE) injection 5 mg (has no administration in time range)  enoxaparin (LOVENOX) injection 40 mg (has no administration in time range)  ALPRAZolam (XANAX) tablet 0.25 mg (has no administration in time range)  ferrous sulfate tablet 325 mg (has no administration in time range)  amLODipine (NORVASC) tablet 5 mg (has no administration in time range)  sertraline (ZOLOFT) tablet 25 mg (has no administration in time range)  atorvastatin (LIPITOR) tablet 10 mg (has no administration in time range)  metoprolol tartrate (LOPRESSOR) tablet 12.5 mg (has no administration in time range)  pantoprazole (PROTONIX) EC tablet 40 mg (has no administration in time range)  methylPREDNISolone sodium succinate (SOLU-MEDROL) 125 mg/2 mL injection 125 mg (125 mg Intravenous Given 11/28/22 1911)  ipratropium-albuterol (DUONEB) 0.5-2.5 (3) MG/3ML nebulizer solution 3 mL (3 mLs Nebulization Given 11/28/22 1914)  iohexol (OMNIPAQUE) 350 MG/ML injection 75 mL (75 mLs Intravenous Contrast Given 11/28/22 1954)  cefTRIAXone (ROCEPHIN) 1 g in sodium chloride 0.9 % 100 mL IVPB (1 g Intravenous New Bag/Given 11/28/22 2115)    Mobility walks     Focused Assessments    R Recommendations:  See Admitting Provider Note  Report given to:   Additional Notes: Patient is A&Ox4, ambulatory but hypoxic with same so bed side commode is her best option. Patient is anxious about staying in the hospital and the provider has ordered her xanax to help. She has gotten abx and will get more before she moves hopefully.

## 2022-11-28 NOTE — ED Triage Notes (Signed)
The pt reports that she has been sob for the past 2 weeks she is a smoker and she does still smoke  she is very anxious  no pain

## 2022-11-28 NOTE — ED Provider Notes (Signed)
EMERGENCY DEPARTMENT AT [facility] Provider Note   CSN: [id number] Arrival date & time: 11/28/22  1755     History  Chief Complaint  Patient presents with   Shortness of Breath    Kathryn Banks is a 67 y.o. female with medical history of EtOH abuse, COPD, arthritis, acid reflux, hepatitis, hypertension.  Patient presents to ED for evaluation of shortness of breath.  Patient states that over the last 2 weeks she has had progressively worsening shortness of breath.  She reports has a history of COPD, still smokes 1 pack/day.  Patient denies home oxygen use.  She states that she has been lightheaded, dizzy and weak over the last 2 weeks.  She states that she has also had feelings of palpitations in her chest but denies any chest pain.  Denies leg swelling, nausea, vomiting, abdominal pain, back pain, dysuria, fevers.  Denies history of recent surgery or travel, history of DVT or PE, exogenous hormone use, hemoptysis, one-sided leg swelling.  Arrives with oxygen saturation 79% room air, placed on 2 L of oxygen via nasal cannula at this time.   Shortness of Breath Associated symptoms: no chest pain        Home Medications Prior to Admission medications   Medication Sig Start Date End Date Taking? Authorizing Provider  albuterol-ipratropium (COMBIVENT) 18-103 MCG/ACT inhaler Inhale 2 puffs into the lungs every 6 (six) hours as needed for wheezing or shortness of breath. 09/01/15   Sharyn Creamer, MD  amLODipine (NORVASC) 5 MG tablet Take 1 tablet (5 mg total) by mouth daily. 02/01/19 03/03/19  Hughie Closs, MD  amLODipine (NORVASC) 5 MG tablet Take 1 tablet (5 mg total) by mouth daily. 02/01/19 03/03/19  Hughie Closs, MD  metoprolol tartrate (LOPRESSOR) 25 MG tablet Take 0.5 tablets (12.5 mg total) by mouth 2 (two) times daily. 02/01/19 03/03/19  Hughie Closs, MD  metoprolol tartrate (LOPRESSOR) 25 MG tablet Take 0.5 tablets (12.5 mg total) by mouth 2 (two) times daily. 02/01/19  03/03/19  Hughie Closs, MD  Naproxen Sod-diphenhydrAMINE (ALEVE PM) 220-25 MG TABS Take 2 tablets by mouth at bedtime.    [provider]  omeprazole (PRILOSEC OTC) 20 MG tablet Take 20 mg by mouth daily.    [provider]      Allergies    Patient has no known allergies.    Review of Systems   Review of Systems  Respiratory:  Positive for shortness of breath.   Cardiovascular:  Positive for palpitations. Negative for chest pain and leg swelling.  Neurological:  Positive for dizziness, weakness and light-headedness.  All other systems reviewed and are negative.   Physical Exam Updated Vital Signs BP 124/77 (BP Location: Left Arm)   Pulse 81   Temp 99.9 F (37.7 C) (Rectal)   Resp (!) 22   Ht 5\' 4"  (1.626 m)   Wt 66.7 kg   LMP 09/25/2005 (Approximate)   SpO2 92%   BMI 25.24 kg/m  Physical Exam Vitals and nursing note reviewed.  Constitutional:      General: She is not in acute distress.    Appearance: Normal appearance. She is not ill-appearing, toxic-appearing or diaphoretic.  HENT:     Head: Normocephalic and atraumatic.     Nose: Nose normal.     Mouth/Throat:     Mouth: Mucous membranes are moist.     Pharynx: Oropharynx is clear.  Eyes:     Extraocular Movements: Extraocular movements intact.     Conjunctiva/sclera:  Conjunctivae normal.     Pupils: Pupils are equal, round, and reactive to light.  Cardiovascular:     Rate and Rhythm: Normal rate and regular rhythm.  Pulmonary:     Effort: Pulmonary effort is normal.     Breath sounds: Normal breath sounds. No wheezing.  Abdominal:     General: Abdomen is flat. Bowel sounds are normal.     Palpations: Abdomen is soft.     Tenderness: There is no abdominal tenderness.  Musculoskeletal:     Cervical back: Normal range of motion and neck supple. No tenderness.     Right lower leg: No edema.     Left lower leg: No edema.  Skin:    General: Skin is warm and dry.     Capillary Refill:  Capillary refill takes less than 2 seconds.  Neurological:     Mental Status: She is alert and oriented to person, place, and time.     ED Results / Procedures / Treatments   Labs (all labs ordered are listed, but only abnormal results are displayed) Labs Reviewed  BASIC METABOLIC PANEL - Abnormal; Notable for the following components:      Result Value   Sodium 131 (*)    Chloride 92 (*)    BUN 5 (*)    Calcium 8.3 (*)    Anion gap 17 (*)    All other components within normal limits  CBC - Abnormal; Notable for the following components:   Hemoglobin 8.8 (*)    HCT 30.6 (*)    MCV 77.1 (*)    MCH 22.2 (*)    MCHC 28.8 (*)    RDW 21.1 (*)    All other components within normal limits  BRAIN NATRIURETIC PEPTIDE - Abnormal; Notable for the following components:   B Natriuretic Peptide 355.4 (*)    All other components within normal limits  I-STAT VENOUS BLOOD GAS, ED - Abnormal; Notable for the following components:   pCO2, Ven 37.6 (*)    pO2, Ven 116 (*)    Calcium, Ion 1.01 (*)    HCT 30.0 (*)    Hemoglobin 10.2 (*)    All other components within normal limits  RESPIRATORY PANEL BY PCR  MRSA NEXT GEN BY PCR, NASAL  CULTURE, BLOOD (ROUTINE X 2)  CULTURE, BLOOD (ROUTINE X 2)  EXPECTORATED SPUTUM ASSESSMENT W GRAM STAIN, RFLX TO RESP C  OCCULT BLOOD X 1 CARD TO LAB, STOOL  HIV ANTIBODY (ROUTINE TESTING W REFLEX)  COMPREHENSIVE METABOLIC PANEL  CBC  HEPATIC FUNCTION PANEL  PROTIME-INR  POC OCCULT BLOOD, ED  TROPONIN I (HIGH SENSITIVITY)  TROPONIN I (HIGH SENSITIVITY)    EKG EKG Interpretation Date/Time:  Friday November 28 2022 17:40:48 EDT Ventricular Rate:  82 PR Interval:  168 QRS Duration:  86 QT Interval:  428 QTC Calculation: 500 R Axis:   -14  Text Interpretation: Normal sinus rhythm Nonspecific ST abnormality new Prolonged QT When compared with ECG of 01-Feb-2019 06:05, PREVIOUS ECG IS PRESENT Confirmed by Gwyneth Sprout ([id number]) on 11/28/2022 8:28:24  PM  Radiology CT Angio Chest PE W and/or Wo Contrast  Result Date: 11/28/2022 CLINICAL DATA:  Shortness of breath.  PE suspected EXAM: CT ANGIOGRAPHY CHEST WITH CONTRAST TECHNIQUE: Multidetector CT imaging of the chest was performed using the standard protocol during bolus administration of intravenous contrast. Multiplanar CT image reconstructions and MIPs were obtained to evaluate the vascular anatomy. RADIATION DOSE REDUCTION: This exam was performed according to the departmental dose-optimization  program which includes automated exposure control, adjustment of the mA and/or kV according to patient size and/or use of iterative reconstruction technique. CONTRAST:  75mL OMNIPAQUE IOHEXOL 350 MG/ML SOLN COMPARISON:  Chest radiograph 11/28/2022 and CTA chest 01/31/2019 FINDINGS: Cardiovascular: Negative for acute pulmonary embolism. Normal caliber thoracic aorta. No pericardial effusion. Coronary artery and aortic atherosclerotic calcification. Mediastinum/Nodes: Layering debris in the trachea. Unremarkable esophagus. Prominent mediastinal and hilar nodes. For example 11 mm right hilar node on 06/183. Lungs/Pleura: Layering debris in the right mainstem bronchus. Additional mucous plugging/debris in the right lower lobe bronchi. Patchy ground-glass and consolidative opacities with interlobular septal thickening bilaterally greatest in the right lower lobe. No pleural effusion or pneumothorax. Mosaic attenuation compatible with air trapping. Biapical pleural-parenchymal scarring. Multiple predominantly subpleural pulmonary nodules measuring up to 6 mm are similar to 2020. No follow-up recommended. Upper Abdomen: Nodular contour of the liver compatible with cirrhosis. No acute abnormality. Musculoskeletal: No acute fracture. Review of the MIP images confirms the above findings. IMPRESSION: 1. Negative for acute pulmonary embolism. 2. Right lower lobe bronchopneumonia. 3. Cirrhosis. Aortic Atherosclerosis  (ICD10-I70.0). Electronically Signed   By: Minerva Fester M.D.   On: 11/28/2022 20:33   DG Chest 2 View  Result Date: 11/28/2022 CLINICAL DATA:  COPD. Shortness of breath with low oxygen saturations. EXAM: CHEST - 2 VIEW COMPARISON:  01/31/2019 FINDINGS: Borderline heart size with normal pulmonary vascularity. Emphysematous changes in the lungs. Hazy interstitial pattern consistent with respiratory bronchiolitis. No consolidation or edema. No pleural effusions. No pneumothorax. Mediastinal contours appear intact. Calcified and tortuous aorta. Degenerative changes in the spine. IMPRESSION: Emphysematous and chronic bronchitic changes in the lungs. No focal consolidation. Electronically Signed   By: Burman Nieves M.D.   On: 11/28/2022 19:19    Procedures .Critical Care  Performed by: Al Decant, PA-C Authorized by: Al Decant, PA-C   Critical care provider statement:    Critical care time (minutes):  75   Critical care time was exclusive of:  Separately billable procedures and treating other patients   Critical care was necessary to treat or prevent imminent or life-threatening deterioration of the following conditions:  Respiratory failure   Critical care was time spent personally by me on the following activities:  Blood draw for specimens, development of treatment plan with patient or surrogate, discussions with consultants, discussions with primary provider, evaluation of patient's response to treatment, examination of patient, interpretation of cardiac output measurements, obtaining history from patient or surrogate, ordering and performing treatments and interventions, ordering and review of laboratory studies, ordering and review of radiographic studies, pulse oximetry, re-evaluation of patient's condition and review of old charts   I assumed direction of critical care for this patient from another provider in my specialty: no     Care discussed with: admitting provider       Medications Ordered in ED Medications  nicotine polacrilex (NICORETTE) gum 2 mg (has no administration in time range)  nicotine (NICODERM CQ - dosed in mg/24 hours) patch 21 mg (21 mg Transdermal Patch Applied 11/28/22 2105)  cefTRIAXone (ROCEPHIN) 1 g in sodium chloride 0.9 % 100 mL IVPB (1 g Intravenous New Bag/Given 11/28/22 2115)  azithromycin (ZITHROMAX) 500 mg in sodium chloride 0.9 % 250 mL IVPB (has no administration in time range)  cefTRIAXone (ROCEPHIN) 1 g in sodium chloride 0.9 % 100 mL IVPB (has no administration in time range)  azithromycin (ZITHROMAX) 500 mg in sodium chloride 0.9 % 250 mL IVPB (has no administration in time  range)  methylPREDNISolone sodium succinate (SOLU-MEDROL) 40 mg/mL injection 40 mg (has no administration in time range)  ipratropium-albuterol (DUONEB) 0.5-2.5 (3) MG/3ML nebulizer solution 3 mL (has no administration in time range)  levalbuterol (XOPENEX) nebulizer solution 0.63 mg (has no administration in time range)  benzonatate (TESSALON) capsule 100 mg (has no administration in time range)  guaiFENesin (MUCINEX) 12 hr tablet 600 mg (has no administration in time range)  sodium chloride flush (NS) 0.9 % injection 3 mL (has no administration in time range)  sodium chloride flush (NS) 0.9 % injection 3 mL (has no administration in time range)  0.9 %  sodium chloride infusion (has no administration in time range)  acetaminophen (TYLENOL) tablet 650 mg (has no administration in time range)    Or  acetaminophen (TYLENOL) suppository 650 mg (has no administration in time range)  senna-docusate (Senokot-S) tablet 1 tablet (has no administration in time range)  ondansetron (ZOFRAN) tablet 4 mg (has no administration in time range)    Or  ondansetron (ZOFRAN) injection 4 mg (has no administration in time range)  hydrALAZINE (APRESOLINE) injection 5 mg (has no administration in time range)  enoxaparin (LOVENOX) injection 40 mg (has no administration in  time range)  ALPRAZolam (XANAX) tablet 0.25 mg (has no administration in time range)  methylPREDNISolone sodium succinate (SOLU-MEDROL) 125 mg/2 mL injection 125 mg (125 mg Intravenous Given 11/28/22 1911)  ipratropium-albuterol (DUONEB) 0.5-2.5 (3) MG/3ML nebulizer solution 3 mL (3 mLs Nebulization Given 11/28/22 1914)  iohexol (OMNIPAQUE) 350 MG/ML injection 75 mL (75 mLs Intravenous Contrast Given 11/28/22 1954)    ED Course/ Medical Decision Making/ A&P  Medical Decision Making Amount and/or Complexity of Data Reviewed Labs: ordered. Radiology: ordered.  Risk Prescription drug management. Decision regarding hospitalization.   67 year old female presents to ED for evaluation.  Please see HPI for further details.  On examination the patient is afebrile and nontachycardic.  Her lung sounds are clear bilaterally, no wheezing however she is hypoxic on room air.  Patient initially arrived with oxygen saturation 79%.  Patient placed on 4 L of oxygen via nasal cannula.  Her abdomen is soft and compressible throughout.  No edema to bilateral lower extremities.  Neurological examination at baseline.  Patient initially given 125 Solu-Medrol, DuoNeb.  Patient CBC shows no leukocytosis, hemoglobin 8.8.  Do not have recent baseline hemoglobin for this patient.  Hemoccult negative here.  Patient denies blood thinner use.  BNP elevated at 355.4 however patient does not appear volume overloaded.  Metabolic panel shows sodium 131, anion gap 17.  Patient troponin at 6.  Patient creatinine not elevated.  Chest x-ray unremarkable.  CT angio shows bronchopneumonia.  Patient started on CAP treatment at this time.  Patient will require admission due to hypoxia.  Patient admitted at this time to Dr. Janalyn Shy.   Final Clinical Impression(s) / ED Diagnoses Final diagnoses:  Community acquired pneumonia of right lower lobe of lung  Hypoxia    Rx / DC Orders ED Discharge Orders     None         Clent Ridges 11/28/22 2128    Gwyneth Sprout, MD 11/29/22 0001

## 2022-11-28 NOTE — H&P (Addendum)
History and Physical    Kathryn Banks ZOX:[id number] DOB: [birth year] DOA: 11/28/2022  PCP: [name], MD   Patient coming from: Home   Chief Complaint:  Chief Complaint  Patient presents with   Shortness of Breath    HPI:  Kathryn Banks is a 67 y.o. female with medical history significant of COPD, chronic smoking, essential hypertension and depression presented to emergency department with complaining of shortness of breath that has been progressively getting worse for last 24 hours.  Also complaining about dizziness as well.  She is currently continuing to smoking 1 pack cigarettes in a day.  Patient reported that she has been noticing progressively worsening shortness of breath with associated mucoid sputum production over the course of last few days.  She denies any fever and chills.  Denies any recent sick contact.  Patient denies any chest pain, shortness of breath, palpitation, headache, blurry vision, abdominal pain, nausea, vomiting and diarrhea.  She is feeling slightly anxious and requesting for some thing for anxiety relief as well.   ED Course:  At initial presentation to ED patient O2 sat dropped to 79% on room air which has been improved to 92% on 2 L. BMP showed slight low sodium 131, low chloride 92, low BUN 5, low calcium 8.3 and elevated anion gap 17. CBC  showed WBC 9, low hemoglobin 8.8 (baseline hemoglobin 15.3 years ago), low hematocrit 30, and platelet count 370 WNL. Troponin 7 WNL. BNP 355. VBG showed low pCO2 37, pO2 116, pH 7.3 and hemoglobin 10.2.  Normal sinus rhythm heart rate 87, nonspecific ST abnormality.  Chest x-ray showed emphysematous and chronic bronchiectatic change in the lung.  No focal consolidation.  Chest ruled out PE however it showed right lower lobe bronchopneumonia.  Cirrhosis.  Aortic atherosclerosis.  In the ED patient has been treated with azithromycin, ceftriaxone for concern for pneumonia.  She also has been treated with DuoNeb  nebulizer and methylprednisone 125 mg once for COPD exacerbation.  Review of Systems:  Review of Systems  Constitutional:  Negative for chills, fever, malaise/fatigue and weight loss.  Respiratory:  Positive for cough, sputum production, shortness of breath and wheezing.   Cardiovascular:  Negative for chest pain and leg swelling.  Gastrointestinal:  Negative for abdominal pain, blood in stool, constipation, diarrhea, heartburn, nausea and vomiting.  Genitourinary:  Negative for dysuria and urgency.  Musculoskeletal:  Negative for myalgias and neck pain.  Skin:  Negative for itching and rash.  Neurological:  Negative for dizziness, tingling, tremors and headaches.  Psychiatric/Behavioral:  Negative for depression, substance abuse and suicidal ideas. The patient is nervous/anxious.     Past Medical History:  Diagnosis Date   Acid reflux    Arthritis    FINGERS   COPD (chronic obstructive pulmonary disease) (HCC)    Depression    ETOH abuse    Hepatitis    C-PT STATED SHE WAS TOLD THIS 2014   Hypertension    OFF MEDS FOR FEW YEARS    Past Surgical History:  Procedure Laterality Date   CESAREAN SECTION     OPEN REDUCTION INTERNAL FIXATION (ORIF) DISTAL RADIAL FRACTURE Right 10/15/2017   Procedure: OPEN REDUCTION INTERNAL FIXATION (ORIF) DISTAL RADIAL FRACTURE;  Surgeon: Kennedy Bucker, MD;  Location: [facility] ORS;  Service: Orthopedics;  Laterality: Right;     reports that she has been smoking cigarettes. She has a 45 pack-year smoking history. She has never used smokeless tobacco. She reports current alcohol use. She reports that she  does not currently use drugs after having used the following drugs: Opium, Benzodiazepines, and Heroin.  No Known Allergies  Family History  Problem Relation Age of Onset   Ulcers Mother        Recent bleeding ulcers requiring transfusion, 2020   CAD Father 78   CVA Sister 42   Breast cancer Neg Hx     Prior to Admission medications   Medication  Sig Start Date End Date Taking? Authorizing Provider  albuterol-ipratropium (COMBIVENT) 18-103 MCG/ACT inhaler Inhale 2 puffs into the lungs every 6 (six) hours as needed for wheezing or shortness of breath. 09/01/15   Sharyn Creamer, MD  amLODipine (NORVASC) 5 MG tablet Take 1 tablet (5 mg total) by mouth daily. 02/01/19 03/03/19  Hughie Closs, MD  amLODipine (NORVASC) 5 MG tablet Take 1 tablet (5 mg total) by mouth daily. 02/01/19 03/03/19  Hughie Closs, MD  metoprolol tartrate (LOPRESSOR) 25 MG tablet Take 0.5 tablets (12.5 mg total) by mouth 2 (two) times daily. 02/01/19 03/03/19  Hughie Closs, MD  metoprolol tartrate (LOPRESSOR) 25 MG tablet Take 0.5 tablets (12.5 mg total) by mouth 2 (two) times daily. 02/01/19 03/03/19  Hughie Closs, MD  Naproxen Sod-diphenhydrAMINE (ALEVE PM) 220-25 MG TABS Take 2 tablets by mouth at bedtime.    [provider]  omeprazole (PRILOSEC OTC) 20 MG tablet Take 20 mg by mouth daily.    [provider]     Physical Exam: Vitals:   11/28/22 1800 11/28/22 1803 11/28/22 1804 11/28/22 2026  BP:   124/77   Pulse:   81   Resp:   (!) 22   Temp:   98.3 F (36.8 C) 99.9 F (37.7 C)  TempSrc:   Oral Rectal  SpO2: (!) 79%  92%   Weight:  66.7 kg    Height:  5\' 4"  (1.626 m)      Physical Exam HENT:     Head: Normocephalic.  Eyes:     Pupils: Pupils are equal, round, and reactive to light.  Cardiovascular:     Rate and Rhythm: Normal rate and regular rhythm.  Pulmonary:     Breath sounds: Examination of the right-upper field reveals wheezing. Examination of the left-upper field reveals wheezing. Examination of the right-middle field reveals wheezing. Examination of the left-middle field reveals wheezing. Examination of the right-lower field reveals rhonchi. Wheezing and rhonchi present. No rales.  Musculoskeletal:        General: Normal range of motion.  Skin:    General: Skin is warm.     Capillary Refill: Capillary refill takes less than 2  seconds.  Neurological:     General: No focal deficit present.     Mental Status: She is alert.  Psychiatric:        Mood and Affect: Mood is anxious.        Behavior: Behavior normal.      Labs on Admission: I have personally reviewed following labs and imaging studies  CBC: Recent Labs  Lab 11/28/22 1811 11/28/22 2035  WBC 9.0  --   HGB 8.8* 10.2*  HCT 30.6* 30.0*  MCV 77.1*  --   PLT 370  --    Basic Metabolic Panel: Recent Labs  Lab 11/28/22 1811 11/28/22 2035  NA 131* 135  K 3.6 3.7  CL 92*  --   CO2 22  --   GLUCOSE 91  --   BUN 5*  --   CREATININE 0.73  --   CALCIUM 8.3*  --  GFR: Estimated Creatinine Clearance: 66.7 mL/min (by C-G formula based on SCr of 0.73 mg/dL). Liver Function Tests: No results for input(s): "AST", "ALT", "ALKPHOS", "BILITOT", "PROT", "ALBUMIN" in the last 168 hours. No results for input(s): "LIPASE", "AMYLASE" in the last 168 hours. No results for input(s): "AMMONIA" in the last 168 hours. Coagulation Profile: No results for input(s): "INR", "PROTIME" in the last 168 hours. Cardiac Enzymes: Recent Labs  Lab 11/28/22 1811 11/28/22 2011  TROPONINIHS 7 6   BNP (last 3 results) Recent Labs    11/28/22 1915  BNP 355.4*   HbA1C: No results for input(s): "HGBA1C" in the last 72 hours. CBG: No results for input(s): "GLUCAP" in the last 168 hours. Lipid Profile: No results for input(s): "CHOL", "HDL", "LDLCALC", "TRIG", "CHOLHDL", "LDLDIRECT" in the last 72 hours. Thyroid Function Tests: No results for input(s): "TSH", "T4TOTAL", "FREET4", "T3FREE", "THYROIDAB" in the last 72 hours. Anemia Panel: No results for input(s): "VITAMINB12", "FOLATE", "FERRITIN", "TIBC", "IRON", "RETICCTPCT" in the last 72 hours. Urine analysis:    Component Value Date/Time   COLORURINE YELLOW (A) 04/20/2016 0710   APPEARANCEUR CLEAR (A) 04/20/2016 0710   APPEARANCEUR Clear 04/22/2013 0408   LABSPEC 1.004 (L) 04/20/2016 0710   LABSPEC 1.026  04/22/2013 0408   PHURINE 7.0 04/20/2016 0710   GLUCOSEU NEGATIVE 04/20/2016 0710   GLUCOSEU Negative 04/22/2013 0408   HGBUR MODERATE (A) 04/20/2016 0710   BILIRUBINUR NEGATIVE 04/20/2016 0710   BILIRUBINUR Negative 04/22/2013 0408   KETONESUR NEGATIVE 04/20/2016 0710   PROTEINUR NEGATIVE 04/20/2016 0710   NITRITE NEGATIVE 04/20/2016 0710   LEUKOCYTESUR TRACE (A) 04/20/2016 0710   LEUKOCYTESUR Negative 04/22/2013 0408    Radiological Exams on Admission: I have personally reviewed images CT Angio Chest PE W and/or Wo Contrast  Result Date: 11/28/2022 CLINICAL DATA:  Shortness of breath.  PE suspected EXAM: CT ANGIOGRAPHY CHEST WITH CONTRAST TECHNIQUE: Multidetector CT imaging of the chest was performed using the standard protocol during bolus administration of intravenous contrast. Multiplanar CT image reconstructions and MIPs were obtained to evaluate the vascular anatomy. RADIATION DOSE REDUCTION: This exam was performed according to the departmental dose-optimization program which includes automated exposure control, adjustment of the mA and/or kV according to patient size and/or use of iterative reconstruction technique. CONTRAST:  75mL OMNIPAQUE IOHEXOL 350 MG/ML SOLN COMPARISON:  Chest radiograph 11/28/2022 and CTA chest 01/31/2019 FINDINGS: Cardiovascular: Negative for acute pulmonary embolism. Normal caliber thoracic aorta. No pericardial effusion. Coronary artery and aortic atherosclerotic calcification. Mediastinum/Nodes: Layering debris in the trachea. Unremarkable esophagus. Prominent mediastinal and hilar nodes. For example 11 mm right hilar node on 06/183. Lungs/Pleura: Layering debris in the right mainstem bronchus. Additional mucous plugging/debris in the right lower lobe bronchi. Patchy ground-glass and consolidative opacities with interlobular septal thickening bilaterally greatest in the right lower lobe. No pleural effusion or pneumothorax. Mosaic attenuation compatible with air  trapping. Biapical pleural-parenchymal scarring. Multiple predominantly subpleural pulmonary nodules measuring up to 6 mm are similar to 2020. No follow-up recommended. Upper Abdomen: Nodular contour of the liver compatible with cirrhosis. No acute abnormality. Musculoskeletal: No acute fracture. Review of the MIP images confirms the above findings. IMPRESSION: 1. Negative for acute pulmonary embolism. 2. Right lower lobe bronchopneumonia. 3. Cirrhosis. Aortic Atherosclerosis (ICD10-I70.0). Electronically Signed   By: Minerva Fester M.D.   On: 11/28/2022 20:33   DG Chest 2 View  Result Date: 11/28/2022 CLINICAL DATA:  COPD. Shortness of breath with low oxygen saturations. EXAM: CHEST - 2 VIEW COMPARISON:  01/31/2019 FINDINGS: Borderline heart  size with normal pulmonary vascularity. Emphysematous changes in the lungs. Hazy interstitial pattern consistent with respiratory bronchiolitis. No consolidation or edema. No pleural effusions. No pneumothorax. Mediastinal contours appear intact. Calcified and tortuous aorta. Degenerative changes in the spine. IMPRESSION: Emphysematous and chronic bronchitic changes in the lungs. No focal consolidation. Electronically Signed   By: Burman Nieves M.D.   On: 11/28/2022 19:19    EKG: My personal interpretation of EKG shows: Normal sinus rhythm heart rate 87, nonspecific ST abnormality.    Assessment/Plan: Principal Problem:   COPD with acute exacerbation (HCC) Active Problems:   Acute hypoxic respiratory failure (HCC)   Right lower lobe pneumonia   Essential hypertension   Continuous dependence on cigarette smoking   History of depression   Cirrhosis (HCC)   GAD (generalized anxiety disorder)   Microcytic anemia   Hyperlipidemia   Elevated brain natriuretic peptide (BNP) level    Assessment and Plan: COPD with acute exacerbation Acute hypoxic respiratory failure -Patient presenting with complaining of shortness of breath, productive cough and  dizziness that has been getting worse for last few days.  In the ED patient O2 sat dropped to 79% on room air which has been improved to 92% on 2 L oxygen.  Reported still smoking 1 pack cigarettes in a day. - Chest x-ray emphysematous  and chronic bronchiectatic change in the lung.  No focal consolidation. -CT chest rule out PE showed right lower lobe pneumonia  -COPD exacerbation in the setting of right lower lobe pneumonia as well as persistent smoking cigarette.   -In the ED patient received methylprednisone 125 mg IV once and DuoNeb nebulizer treatment once. - Plan to continue methylprednisone 40 mg IV daily for next 5 days, continue DuoNeb every 6 hours scheduled and Xopenex every 6 hour as needed for wheezing shortness of breath. - Continue check pulse ox and oxygen as needed to keep O2 sat above 92%.  Wean down oxygen to room air as patient tolerates. - Continue Tessalon and Mucinex as needed - Continue spirometry. -Continue cardiac monitoring -Admitting patient to medical-telemetry unit.   Right lower lobe pneumonia -CT chest showed right lower lobe pneumonia - Checking blood culture, sputum culture, respiratory panel, urine Legionella and urine Streptococcus antigen. - Continue to treat empirically with IV ceftriaxone and IV azithromycin. -Continue Tessalon and Mucinex as needed   Cirrhosis History chronic alcohol use -CT chest showed cirrhosis.  Patient has history of chronic alcohol use.  Currently denied any use of alcohol.  Checking hepatic function panel, PT and INR. - Checking hepatic ultrasound.   Generalized anxiety disorder -Resuming Zoloft 25 mg daily. -Patient is requesting for Xanax.  Unable to find it on patient's home medication list. - Ordered alprazolam 0.25 mg every 3 hours as needed.  Microcytic anemia -Hemoglobin on presentation 8.8.  Per chart review patient used to have hemoglobin around 15 which was last checked 3 years ago.  She denies any noticing  blood in her stool. -Low MCV 77. - Pending fecal occult blood stool test. - Checking anemia panel. -Starting oral iron supplement.   Elevated BNP -Patient coming with complaining of shortness of breath which is likely secondary to COPD exacerbation.  She has history of essential hypertension.  No previous echocardiogram on the chart.  Found to have elevated BNP 355. - Obtaining complete echocardiogram to assess baseline heart function.   Essential hypertension -Blood pressure is well-controlled. -Resume home amlodipine 5 mg daily and Lopressor 12.5 mg twice daily.   Continuous dependence on  cigarette smoking -Patient currently smoking 1 pack cigarettes needed - Started nicotine patch 21 mg daily - Counseled patient at the bedside for smoking cessation.  Hyperlipidemia - Continue Lipitor 10 mg daily.  GERD -Continue omeprazole 20 mg daily  DVT prophylaxis:  Lovenox Code Status:  Full Code Diet: Heart healthy diet Family Communication:  None Disposition Plan: Pending clinical improvement.  Tentative discharge to home next 2 to 3 days Consults:  none Admission status:   Inpatient, Telemetry bed  Severity of Illness: The appropriate patient status for this patient is INPATIENT. Inpatient status is judged to be reasonable and necessary in order to provide the required intensity of service to ensure the patient's safety. The patient's presenting symptoms, physical exam findings, and initial radiographic and laboratory data in the context of their chronic comorbidities is felt to place them at high risk for further clinical deterioration. Furthermore, it is not anticipated that the patient will be medically stable for discharge from the hospital within 2 midnights of admission.   * I certify that at the point of admission it is my clinical judgment that the patient will require inpatient hospital care spanning beyond 2 midnights from the point of admission due to high intensity of  service, high risk for further deterioration and high frequency of surveillance required.Marland Kitchen    Tereasa Coop, MD [facility]  How to contact the [facility] Attending or Consulting provider 7A - 7P or covering provider during after hours 7P -7A, for this patient.  Check the care team in [facility] and look for a) attending/consulting [facility] provider listed and b) the [facility] team listed Log into [email or web address] and use [place]'s universal password to access. If you do not have the password, please contact the hospital operator. Locate the [facility] provider you are looking for under [facility] and page to a number that you can be directly reached. If you still have difficulty reaching the provider, please page the [facility] (Director on Call) for the Hospitalists listed on amion for assistance.  11/28/2022, 9:48 PM

## 2022-11-29 ENCOUNTER — Inpatient Hospital Stay (HOSPITAL_COMMUNITY): Payer: Medicare Other

## 2022-11-29 DIAGNOSIS — R012 Other cardiac sounds: Secondary | ICD-10-CM

## 2022-11-29 DIAGNOSIS — F17210 Nicotine dependence, cigarettes, uncomplicated: Secondary | ICD-10-CM | POA: Diagnosis not present

## 2022-11-29 DIAGNOSIS — K7030 Alcoholic cirrhosis of liver without ascites: Secondary | ICD-10-CM | POA: Diagnosis not present

## 2022-11-29 DIAGNOSIS — J9601 Acute respiratory failure with hypoxia: Secondary | ICD-10-CM | POA: Diagnosis not present

## 2022-11-29 DIAGNOSIS — J189 Pneumonia, unspecified organism: Secondary | ICD-10-CM | POA: Diagnosis not present

## 2022-11-29 LAB — RESPIRATORY PANEL BY PCR

## 2022-11-29 LAB — CBC
HCT: 28.3 % — ABNORMAL LOW (ref 36.0–46.0)
Hemoglobin: 8.4 g/dL — ABNORMAL LOW (ref 12.0–15.0)
MCH: 21.9 pg — ABNORMAL LOW (ref 26.0–34.0)
MCHC: 29.7 g/dL — ABNORMAL LOW (ref 30.0–36.0)
MCV: 73.9 fL — ABNORMAL LOW (ref 80.0–100.0)
Platelets: 337 10*3/uL (ref 150–400)
RBC: 3.83 MIL/uL — ABNORMAL LOW (ref 3.87–5.11)
RDW: 21.2 % — ABNORMAL HIGH (ref 11.5–15.5)
WBC: 8.3 10*3/uL (ref 4.0–10.5)
nRBC: 0 % (ref 0.0–0.2)

## 2022-11-29 LAB — COMPREHENSIVE METABOLIC PANEL
ALT: 24 U/L (ref 0–44)
AST: 50 U/L — ABNORMAL HIGH (ref 15–41)
Albumin: 3 g/dL — ABNORMAL LOW (ref 3.5–5.0)
Alkaline Phosphatase: 191 U/L — ABNORMAL HIGH (ref 38–126)
Anion gap: 9 (ref 5–15)
BUN: <5 mg/dL — ABNORMAL LOW (ref 8–23)
CO2: 25 mmol/L (ref 22–32)
Calcium: 8.1 mg/dL — ABNORMAL LOW (ref 8.9–10.3)
Chloride: 96 mmol/L — ABNORMAL LOW (ref 98–111)
Creatinine, Ser: 0.76 mg/dL (ref 0.44–1.00)
GFR, Estimated: >60 mL/min (ref >60)
Glucose, Bld: 156 mg/dL — ABNORMAL HIGH (ref 70–99)
Potassium: 4.2 mmol/L (ref 3.5–5.1)
Sodium: 130 mmol/L — ABNORMAL LOW (ref 135–145)
Total Bilirubin: 0.8 mg/dL (ref 0.3–1.2)
Total Protein: 8.3 g/dL — ABNORMAL HIGH (ref 6.5–8.1)

## 2022-11-29 LAB — VITAMIN B12: Vitamin B-12: 273 pg/mL (ref 180–914)

## 2022-11-29 LAB — ECHOCARDIOGRAM COMPLETE
AR max vel: 1.86 cm2
AV Area VTI: 1.81 cm2
AV Area mean vel: 2.03 cm2
AV Mean grad: 8 mmHg
AV Peak grad: 16.6 mmHg
Ao pk vel: 2.04 m/s
Area-P 1/2: 4.06 cm2
Height: 64 in
S' Lateral: 3.2 cm
Weight: 2317.48 [oz_av]

## 2022-11-29 LAB — EXPECTORATED SPUTUM ASSESSMENT W GRAM STAIN, RFLX TO RESP C

## 2022-11-29 LAB — STREP PNEUMONIAE URINARY ANTIGEN: Strep Pneumo Urinary Antigen: NEGATIVE

## 2022-11-29 LAB — POTASSIUM: Potassium: 3.8 mmol/L (ref 3.5–5.1)

## 2022-11-29 LAB — MRSA NEXT GEN BY PCR, NASAL: MRSA by PCR Next Gen: NOT DETECTED

## 2022-11-29 LAB — MAGNESIUM: Magnesium: 1.4 mg/dL — ABNORMAL LOW (ref 1.7–2.4)

## 2022-11-29 LAB — PROTIME-INR
INR: 1.2 (ref 0.8–1.2)
Prothrombin Time: 14.9 s (ref 11.4–15.2)

## 2022-11-29 LAB — HIV ANTIBODY (ROUTINE TESTING W REFLEX): HIV Screen 4th Generation wRfx: NONREACTIVE

## 2022-11-29 MED ORDER — BUDESONIDE 0.25 MG/2ML IN SUSP
0.2500 mg | Freq: Two times a day (BID) | RESPIRATORY_TRACT | Status: DC
  Administered 2022-11-29 – 2022-12-02 (×7): 0.25 mg via RESPIRATORY_TRACT
  Filled 2022-11-29 (×7): qty 2

## 2022-11-29 MED ORDER — METHYLPREDNISOLONE SODIUM SUCC 40 MG IJ SOLR
40.0000 mg | INTRAMUSCULAR | Status: AC
  Administered 2022-11-29 – 2022-12-02 (×4): 40 mg via INTRAVENOUS
  Filled 2022-11-29 (×4): qty 1

## 2022-11-29 MED ORDER — FUROSEMIDE 10 MG/ML IJ SOLN
20.0000 mg | Freq: Once | INTRAMUSCULAR | Status: DC
Start: 2022-11-29 — End: 2022-11-29

## 2022-11-29 MED ORDER — FOLIC ACID 1 MG PO TABS
1.0000 mg | ORAL_TABLET | Freq: Every day | ORAL | Status: DC
  Administered 2022-11-29 – 2022-12-05 (×7): 1 mg via ORAL
  Filled 2022-11-29 (×7): qty 1

## 2022-11-29 MED ORDER — CYCLOBENZAPRINE HCL 5 MG PO TABS
5.0000 mg | ORAL_TABLET | Freq: Three times a day (TID) | ORAL | Status: DC | PRN
  Administered 2022-11-29 – 2022-12-04 (×7): 5 mg via ORAL
  Filled 2022-11-29 (×7): qty 1

## 2022-11-29 MED ORDER — ARFORMOTEROL TARTRATE 15 MCG/2ML IN NEBU
15.0000 ug | INHALATION_SOLUTION | Freq: Two times a day (BID) | RESPIRATORY_TRACT | Status: DC
  Administered 2022-11-29 – 2022-12-02 (×7): 15 ug via RESPIRATORY_TRACT
  Filled 2022-11-29 (×7): qty 2

## 2022-11-29 MED ORDER — FUROSEMIDE 10 MG/ML IJ SOLN
20.0000 mg | Freq: Every day | INTRAMUSCULAR | Status: AC
  Administered 2022-11-29 – 2022-11-30 (×2): 20 mg via INTRAVENOUS
  Filled 2022-11-29 (×2): qty 2

## 2022-11-29 NOTE — Progress Notes (Signed)
[facility]                                                                              Kathryn Banks, is a 67 y.o. female, DOB - 1956/02/23, ZOX:[id number] Admit date - 11/28/2022    Outpatient Primary MD for the patient is Kathryn Humphrey, MD  LOS - 1  days  Chief Complaint  Patient presents with   Shortness of Breath       Brief summary   Patient is a 67 year old female COPD, chronic smoking, HTN, depression presented to ED with shortness of breath, progressively getting worse with cough and sputum production in the last 24 hours.  Also complaining dizziness, smokes 1 pack/day. In ED, patient was noted to be hypoxic, O2 sats 79% on room air, improved to 92% on 2 L CT chest ruled out PE however showed right lower lobe bronchopneumonia, cirrhosis, aortic atherosclerosis.  Patient admitted for further workup  Assessment & Plan    Principal Problem:   Acute hypoxic respiratory failure (HCC) secondary to COPD with acute exacerbation, right lower lung pneumonia -O2 sats currently 94 to 98% on 3.5 L, not on O2 at baseline -Wean O2 as tolerated.  CTA chest ruled out PE however showed right lower lobe pneumonia. -Elevated BNP 355, follow 2D echo.  Overnight, escalated to 3.5 L O2, will give Lasix 20 mg IV x 2 doses -Home O2 evaluation at discharge.  Active Problems:   COPD with acute exacerbation (HCC) -Continue IV Solu-Medrol, scheduled DuoNebs -Placed on Pulmicort, Brovana, flutter valve -Continue IV Zithromax, Rocephin    Right lower lobe pneumonia -Continue IV Zithromax, IV Rocephin -Continue Tessalon and Mucinex as needed -Respiratory virus panel negative, urine strep antigen negative, urine Legionella antigen pending -Blood cultures negative so far  Microcytic anemia, folic acid deficiency, iron deficiency -Hemoglobin on presentation 8.8, low MCV -Anemia panel showed B12 273, folate very low 2.2 -Iron panel Fe 19, TIBC 470, percent saturation ratio 4,  ferritin 20.  -Will benefit from outpatient iron infusion and colonoscopy once acute respiratory illness is resolved.  FOBT pending -Start folic acid 1 mg daily.  Continue ferrous sulfate.  Nicotine abuse -Counseled strongly on smoking cessation Continue nicotine patch    Essential hypertension -Continue amlodipine, Lopressor    History of depression, anxiety -Continue Zoloft     Cirrhosis (HCC), history of alcohol use -CT chest reported cirrhosis, patient has a history of chronic alcohol use -Abdominal ultrasound showed nodular contour of the liver consistent with cirrhosis, no focal liver abnormality, unremarkable Doppler evaluation -LFTs showed AST > ALT, elevated alk phos.  PLT, INR normal - Counseled strongly on alcohol cessation -Continue CIWA with Ativan protocol, thiamine, folate    Hyperlipidemia -Hold statin until LFTs normalize   Estimated body mass index is 24.86 kg/m as calculated from the following:   Height as of this encounter: 5\' 4"  (1.626 m).   Weight as of this encounter: 65.7 kg.  Code Status: Full code DVT Prophylaxis:  enoxaparin (LOVENOX) injection 40 mg Start: 11/29/22 0800 SCDs Start: 11/28/22 2116   Level of Care: Level of care: Telemetry Medical Family Communication: Updated patient Disposition  Plan:      Remains inpatient appropriate:   Possible DC home tomorrow if improving, patient reports that [birth year]   Procedures:  None  Consultants:   None  Antimicrobials:   Anti-infectives (From admission, onward)    Start     Dose/Rate Route Frequency Ordered Stop   11/29/22 2200  cefTRIAXone (ROCEPHIN) 1 g in sodium chloride 0.9 % 100 mL IVPB        1 g 200 mL/hr over 30 Minutes Intravenous Every 24 hours 11/28/22 2115 12/06/22 2159   11/29/22 2200  azithromycin (ZITHROMAX) 500 mg in sodium chloride 0.9 % 250 mL IVPB        500 mg 250 mL/hr over 60 Minutes Intravenous Every 24 hours 11/28/22 2115 12/04/22 2159   11/28/22 2100   cefTRIAXone (ROCEPHIN) 1 g in sodium chloride 0.9 % 100 mL IVPB        1 g 200 mL/hr over 30 Minutes Intravenous  Once 11/28/22 2047 11/28/22 2221   11/28/22 2100  azithromycin (ZITHROMAX) 500 mg in sodium chloride 0.9 % 250 mL IVPB        500 mg 250 mL/hr over 60 Minutes Intravenous  Once 11/28/22 2047 11/29/22 0128          Medications  amLODipine  5 mg Oral Daily   atorvastatin  10 mg Oral Daily   enoxaparin (LOVENOX) injection  40 mg Subcutaneous Q24H   ferrous sulfate  325 mg Oral Q breakfast   ipratropium-albuterol  3 mL Nebulization Q6H   methylPREDNISolone (SOLU-MEDROL) injection  40 mg Intravenous Q24H   metoprolol tartrate  12.5 mg Oral BID   nicotine  21 mg Transdermal Daily   pantoprazole  40 mg Oral Daily   sertraline  25 mg Oral QHS   sodium chloride flush  3 mL Intravenous Q12H      Subjective:   Kathryn Banks was seen and examined today.  Still having some faint wheezing, otherwise no acute complaints, no chest pain or fevers or chills.  Still having some cough, no abdominal pain nausea vomiting.  O2 sats 94% on 3.5 L  Objective:   Vitals:   11/29/22 0001 11/29/22 0532 11/29/22 0758 11/29/22 0759  BP: (!) 153/84 (!) 147/84  133/80  Pulse: 84 71 78 84  Resp:  17 19 20   Temp:  98.2 F (36.8 C)  97.9 F (36.6 C)  TempSrc:  Oral  Oral  SpO2:  98% 95% 93%  Weight:  65.7 kg    Height:        Intake/Output Summary (Last 24 hours) at 11/29/2022 1146 Last data filed at 11/29/2022 1032 Gross per 24 hour  Intake 360 ml  Output 1751 ml  Net -1391 ml     Wt Readings from Last 3 Encounters:  11/29/22 65.7 kg  02/01/19 66.7 kg  10/15/17 66.2 kg     Exam General: Alert and oriented x 3, NAD Cardiovascular: S1 S2 auscultated,  RRR Respiratory: expiratory wheezing bilaterally Gastrointestinal: Soft, nontender, nondistended, + bowel sounds Ext: no pedal edema bilaterally Neuro: no new deficits Skin: No rashes Psych: Normal affect     Data  Reviewed:  I have personally reviewed following labs    CBC Lab Results  Component Value Date   WBC 8.3 11/29/2022   RBC 3.83 (L) 11/29/2022   HGB 8.4 (L) 11/29/2022   HCT 28.3 (L) 11/29/2022   MCV 73.9 (L) 11/29/2022   MCH 21.9 (L) 11/29/2022   PLT 337 11/29/2022  MCHC 29.7 (L) 11/29/2022   RDW 21.2 (H) 11/29/2022   LYMPHSABS 3.1 04/22/2013   MONOABS 0.6 04/22/2013   EOSABS 0.1 04/22/2013   BASOSABS 0.1 04/22/2013     Last metabolic panel Lab Results  Component Value Date   NA 130 (L) 11/29/2022   K 4.2 11/29/2022   CL 96 (L) 11/29/2022   CO2 25 11/29/2022   BUN <5 (L) 11/29/2022   CREATININE 0.76 11/29/2022   GLUCOSE 156 (H) 11/29/2022   GFRNONAA >60 11/29/2022   GFRAA >60 01/31/2019   CALCIUM 8.1 (L) 11/29/2022   PROT 8.3 (H) 11/29/2022   ALBUMIN 3.0 (L) 11/29/2022   BILITOT 0.8 11/29/2022   ALKPHOS 191 (H) 11/29/2022   AST 50 (H) 11/29/2022   ALT 24 11/29/2022   ANIONGAP 9 11/29/2022    CBG (last 3)  No results for input(s): "GLUCAP" in the last 72 hours.    Coagulation Profile: Recent Labs  Lab 11/29/22 0027  INR 1.2     Radiology Studies: I have personally reviewed the imaging studies  US ABDOMEN LIMITED WITH LIVER DOPPLER  Result Date: 11/28/2022 CLINICAL DATA:  Cirrhosis EXAM: DUPLEX ULTRASOUND OF LIVER TECHNIQUE: Color and duplex Doppler ultrasound was performed to evaluate the hepatic in-flow and out-flow vessels. COMPARISON:  11/28/2022 FINDINGS: Liver: Normal parenchymal echogenicity. Nodularity of the liver capsule as identified on earlier CT, consistent with cirrhosis. No focal lesion, mass or intrahepatic biliary ductal dilatation. Gallbladder: No evidence of cholelithiasis or cholecystitis. Common bile duct: 8 mm. Main Portal Vein size: 1.4 cm Portal Vein Velocities Main Prox:  32.5 cm/sec Main Mid: 20.4 cm/sec Main Dist:  28.1 cm/sec Right: 22.2 cm/sec Left: 19.5 cm/sec Normal directional flow within the portal vein. Hepatic Vein Velocities  Right:  7.0 cm/sec Middle:  60.5 cm/sec Left:  50.8 cm/sec Normal directional flow within the hepatic veins. IVC: Present and patent with normal respiratory phasicity. Hepatic Artery Velocity:  92.5 cm/sec Splenic Vein Velocity:  15.4 cm/sec Spleen: 10.8 cm x 5.9 cm x 6.2 cm with a total volume of 206 cm^3 (411 cm^3 is upper limit normal) Portal Vein Occlusion/Thrombus: No Splenic Vein Occlusion/Thrombus: No Ascites: None Varices: None IMPRESSION: 1. Nodular contour of the liver consistent with cirrhosis. No focal liver abnormality. 2. Unremarkable Doppler evaluation. Normal directional flow within the portal vein. Electronically Signed   By: Sharlet Salina M.D.   On: 11/28/2022 23:02   CT Angio Chest PE W and/or Wo Contrast  Result Date: 11/28/2022 CLINICAL DATA:  Shortness of breath.  PE suspected EXAM: CT ANGIOGRAPHY CHEST WITH CONTRAST TECHNIQUE: Multidetector CT imaging of the chest was performed using the standard protocol during bolus administration of intravenous contrast. Multiplanar CT image reconstructions and MIPs were obtained to evaluate the vascular anatomy. RADIATION DOSE REDUCTION: This exam was performed according to the departmental dose-optimization program which includes automated exposure control, adjustment of the mA and/or kV according to patient size and/or use of iterative reconstruction technique. CONTRAST:  75mL OMNIPAQUE IOHEXOL 350 MG/ML SOLN COMPARISON:  Chest radiograph 11/28/2022 and CTA chest 01/31/2019 FINDINGS: Cardiovascular: Negative for acute pulmonary embolism. Normal caliber thoracic aorta. No pericardial effusion. Coronary artery and aortic atherosclerotic calcification. Mediastinum/Nodes: Layering debris in the trachea. Unremarkable esophagus. Prominent mediastinal and hilar nodes. For example 11 mm right hilar node on 06/183. Lungs/Pleura: Layering debris in the right mainstem bronchus. Additional mucous plugging/debris in the right lower lobe bronchi. Patchy  ground-glass and consolidative opacities with interlobular septal thickening bilaterally greatest in the right lower lobe. No  pleural effusion or pneumothorax. Mosaic attenuation compatible with air trapping. Biapical pleural-parenchymal scarring. Multiple predominantly subpleural pulmonary nodules measuring up to 6 mm are similar to 2020. No follow-up recommended. Upper Abdomen: Nodular contour of the liver compatible with cirrhosis. No acute abnormality. Musculoskeletal: No acute fracture. Review of the MIP images confirms the above findings. IMPRESSION: 1. Negative for acute pulmonary embolism. 2. Right lower lobe bronchopneumonia. 3. Cirrhosis. Aortic Atherosclerosis (ICD10-I70.0). Electronically Signed   By: Minerva Fester M.D.   On: 11/28/2022 20:33   DG Chest 2 View  Result Date: 11/28/2022 CLINICAL DATA:  COPD. Shortness of breath with low oxygen saturations. EXAM: CHEST - 2 VIEW COMPARISON:  01/31/2019 FINDINGS: Borderline heart size with normal pulmonary vascularity. Emphysematous changes in the lungs. Hazy interstitial pattern consistent with respiratory bronchiolitis. No consolidation or edema. No pleural effusions. No pneumothorax. Mediastinal contours appear intact. Calcified and tortuous aorta. Degenerative changes in the spine. IMPRESSION: Emphysematous and chronic bronchitic changes in the lungs. No focal consolidation. Electronically Signed   By: Burman Nieves M.D.   On: 11/28/2022 19:19       Johannah Rozas M.D. [facility] 11/29/2022, 11:46 AM  Available via Epic secure chat 7am-7pm After 7 pm, please refer to night coverage provider listed on amion.

## 2022-11-29 NOTE — Plan of Care (Signed)

## 2022-11-29 NOTE — Progress Notes (Signed)
*  PRELIMINARY RESULTS* Echocardiogram 2D Echocardiogram has been performed.  Stacey Drain 11/29/2022, 3:42 PM

## 2022-11-30 DIAGNOSIS — I10 Essential (primary) hypertension: Secondary | ICD-10-CM | POA: Diagnosis not present

## 2022-11-30 DIAGNOSIS — J189 Pneumonia, unspecified organism: Secondary | ICD-10-CM | POA: Diagnosis not present

## 2022-11-30 DIAGNOSIS — J9601 Acute respiratory failure with hypoxia: Secondary | ICD-10-CM | POA: Diagnosis not present

## 2022-11-30 LAB — COMPREHENSIVE METABOLIC PANEL
ALT: 22 U/L (ref 0–44)
AST: 38 U/L (ref 15–41)
Albumin: 2.9 g/dL — ABNORMAL LOW (ref 3.5–5.0)
Alkaline Phosphatase: 155 U/L — ABNORMAL HIGH (ref 38–126)
Anion gap: 10 (ref 5–15)
BUN: 11 mg/dL (ref 8–23)
CO2: 24 mmol/L (ref 22–32)
Calcium: 8.6 mg/dL — ABNORMAL LOW (ref 8.9–10.3)
Chloride: 101 mmol/L (ref 98–111)
Creatinine, Ser: 0.66 mg/dL (ref 0.44–1.00)
GFR, Estimated: >60 mL/min (ref >60)
Glucose, Bld: 147 mg/dL — ABNORMAL HIGH (ref 70–99)
Potassium: 4.7 mmol/L (ref 3.5–5.1)
Sodium: 135 mmol/L (ref 135–145)
Total Bilirubin: 1.1 mg/dL (ref 0.3–1.2)
Total Protein: 8 g/dL (ref 6.5–8.1)

## 2022-11-30 LAB — CBC
HCT: 27.2 % — ABNORMAL LOW (ref 36.0–46.0)
Hemoglobin: 7.9 g/dL — ABNORMAL LOW (ref 12.0–15.0)
MCH: 21.9 pg — ABNORMAL LOW (ref 26.0–34.0)
MCHC: 29 g/dL — ABNORMAL LOW (ref 30.0–36.0)
MCV: 75.3 fL — ABNORMAL LOW (ref 80.0–100.0)
Platelets: 319 10*3/uL (ref 150–400)
RBC: 3.61 MIL/uL — ABNORMAL LOW (ref 3.87–5.11)
RDW: 21.3 % — ABNORMAL HIGH (ref 11.5–15.5)
WBC: 6.4 10*3/uL (ref 4.0–10.5)
nRBC: 0 % (ref 0.0–0.2)

## 2022-11-30 MED ORDER — AZITHROMYCIN 250 MG PO TABS
500.0000 mg | ORAL_TABLET | Freq: Every day | ORAL | Status: AC
  Administered 2022-11-30 – 2022-12-02 (×3): 500 mg via ORAL
  Filled 2022-11-30 (×4): qty 2

## 2022-11-30 MED ORDER — SODIUM CHLORIDE 0.9 % IV SOLN
250.0000 mg | Freq: Every day | INTRAVENOUS | Status: AC
  Administered 2022-11-30 – 2022-12-01 (×2): 250 mg via INTRAVENOUS
  Filled 2022-11-30 (×2): qty 20

## 2022-11-30 NOTE — Progress Notes (Signed)
[facility]                                                                              Kathryn Banks, is a 67 y.o. female, DOB - 06-27-1955, ZOX:[id number] Admit date - 11/28/2022    Outpatient Primary MD for the patient is [name], MD  LOS - 2  days  Chief Complaint  Patient presents with   Shortness of Breath       Brief summary   Patient is a 67 year old female COPD, chronic smoking, HTN, depression presented to ED with shortness of breath, progressively getting worse with cough and sputum production in the last 24 hours.  Also complaining dizziness, smokes 1 pack/day. In ED, patient was noted to be hypoxic, O2 sats 79% on room air, improved to 92% on 2 L CT chest ruled out PE however showed right lower lobe bronchopneumonia, cirrhosis, aortic atherosclerosis.  Patient admitted for further workup  Assessment & Plan    Principal Problem:   Acute hypoxic respiratory failure (HCC) secondary to COPD with acute exacerbation, right lower lung pneumonia -CTA chest ruled out PE however showed right lower lobe pneumonia. -Elevated BNP 355 -Wheezing, O2 sats improving, 97% on 2 L this morning, wean O2 as tolerated, not on O2 at baseline -Received Lasix 20 mg IV daily x 2 doses -2D echo showed EF of 65 to 70%, G1 DD, severely dilated right and left atrial size, normal right ventricular systolic function  Active Problems:   COPD with acute exacerbation (HCC) -Continue IV Solu-Medrol, scheduled DuoNebs -Placed on Pulmicort, Brovana, flutter valve -Continue IV Zithromax, Rocephin -Will transition to oral prednisone in a.m., home O2 evaluation in a.m.    Right lower lobe pneumonia -Continue IV Zithromax, IV Rocephin -Continue Tessalon and Mucinex as needed -Respiratory virus panel negative, urine strep antigen negative, urine Legionella antigen pending -Blood cultures negative so far  Microcytic anemia, folic acid deficiency, iron deficiency -Hemoglobin  on presentation 8.8, low MCV,  -Anemia panel showed B12 273, folate very low 2.2 -Iron panel Fe 19, TIBC 470, percent saturation ratio 4, ferritin 20.  - colonoscopy once acute respiratory illness is resolved.  FOBT negative. -Start folic acid 1 mg daily.  -Hemoglobin 7.9, will give IV iron  Nicotine abuse -Counseled strongly on smoking cessation Continue nicotine patch    Essential hypertension -Continue amlodipine, Lopressor    History of depression, anxiety -Continue Zoloft     Cirrhosis (HCC), history of alcohol use -CT chest reported cirrhosis, patient has a history of chronic alcohol use -Abdominal ultrasound showed nodular contour of the liver consistent with cirrhosis, no focal liver abnormality, unremarkable Doppler evaluation -LFTs showed AST > ALT, elevated alk phos.  PLT, INR normal - Counseled strongly on alcohol cessation -Continue CIWA with Ativan protocol, thiamine, folate    Hyperlipidemia -Hold statin until LFTs normalize   Estimated body mass index is 24.14 kg/m as calculated from the following:   Height as of this encounter: 5\' 4"  (1.626 m).   Weight as of this encounter: 63.8 kg.  Code Status: Full code DVT Prophylaxis:  enoxaparin (LOVENOX) injection 40 mg Start: 11/29/22 0800 SCDs Start:  11/28/22 2116   Level of Care: Level of care: Telemetry Medical Family Communication: Updated patient Disposition Plan:      Remains inpatient appropriate: DC home tomorrow, still has wheezing, hypoxia  Procedures:  None  Consultants:   None  Antimicrobials:   Anti-infectives (From admission, onward)    Start     Dose/Rate Route Frequency Ordered Stop   11/29/22 2200  cefTRIAXone (ROCEPHIN) 1 g in sodium chloride 0.9 % 100 mL IVPB        1 g 200 mL/hr over 30 Minutes Intravenous Every 24 hours 11/28/22 2115 12/06/22 2159   11/29/22 2200  azithromycin (ZITHROMAX) 500 mg in sodium chloride 0.9 % 250 mL IVPB        500 mg 250 mL/hr over 60 Minutes  Intravenous Every 24 hours 11/28/22 2115 12/04/22 2159   11/28/22 2100  cefTRIAXone (ROCEPHIN) 1 g in sodium chloride 0.9 % 100 mL IVPB        1 g 200 mL/hr over 30 Minutes Intravenous  Once 11/28/22 2047 11/28/22 2221   11/28/22 2100  azithromycin (ZITHROMAX) 500 mg in sodium chloride 0.9 % 250 mL IVPB        500 mg 250 mL/hr over 60 Minutes Intravenous  Once 11/28/22 2047 11/29/22 0128          Medications  amLODipine  5 mg Oral Daily   arformoterol  15 mcg Nebulization BID   budesonide (PULMICORT) nebulizer solution  0.25 mg Nebulization BID   enoxaparin (LOVENOX) injection  40 mg Subcutaneous Q24H   ferrous sulfate  325 mg Oral Q breakfast   folic acid  1 mg Oral Daily   ipratropium-albuterol  3 mL Nebulization Q6H   methylPREDNISolone (SOLU-MEDROL) injection  40 mg Intravenous Q24H   metoprolol tartrate  12.5 mg Oral BID   nicotine  21 mg Transdermal Daily   pantoprazole  40 mg Oral Daily   sertraline  25 mg Oral QHS   sodium chloride flush  3 mL Intravenous Q12H      Subjective:   Kathryn Banks was seen and examined today.  Mild wheezing, O2 sats improving, on 2 L.  No chest pain, acute shortness of breath, fevers or chills.    Objective:   Vitals:   11/30/22 0504 11/30/22 0519 11/30/22 0752 11/30/22 0800  BP: (!) 142/80  (!) 141/89   Pulse: 74  88 73  Resp: 17  18 18   Temp: 97.9 F (36.6 C)  98 F (36.7 C)   TempSrc: Oral  Oral   SpO2: 94%  96% 97%  Weight:  63.8 kg    Height:        Intake/Output Summary (Last 24 hours) at 11/30/2022 1234 Last data filed at 11/30/2022 0505 Gross per 24 hour  Intake 560 ml  Output 2500 ml  Net -1940 ml     Wt Readings from Last 3 Encounters:  11/30/22 63.8 kg  02/01/19 66.7 kg  10/15/17 66.2 kg   Physical Exam General: Alert and oriented x 3, NAD Cardiovascular: S1 S2 clear, RRR.  Respiratory: Mild faint expiratory wheezing R>L  Gastrointestinal: Soft, nontender, nondistended, NBS Ext: no pedal edema  bilaterally Neuro: no new deficits Skin: No rashes Psych: Normal affect     Data Reviewed:  I have personally reviewed following labs    CBC Lab Results  Component Value Date   WBC 6.4 11/30/2022   RBC 3.61 (L) 11/30/2022   HGB 7.9 (L) 11/30/2022   HCT 27.2 (L) 11/30/2022  MCV 75.3 (L) 11/30/2022   MCH 21.9 (L) 11/30/2022   PLT 319 11/30/2022   MCHC 29.0 (L) 11/30/2022   RDW 21.3 (H) 11/30/2022   LYMPHSABS 3.1 04/22/2013   MONOABS 0.6 04/22/2013   EOSABS 0.1 04/22/2013   BASOSABS 0.1 04/22/2013     Last metabolic panel Lab Results  Component Value Date   NA 135 11/30/2022   K 4.7 11/30/2022   CL 101 11/30/2022   CO2 24 11/30/2022   BUN 11 11/30/2022   CREATININE 0.66 11/30/2022   GLUCOSE 147 (H) 11/30/2022   GFRNONAA >60 11/30/2022   GFRAA >60 01/31/2019   CALCIUM 8.6 (L) 11/30/2022   PROT 8.0 11/30/2022   ALBUMIN 2.9 (L) 11/30/2022   BILITOT 1.1 11/30/2022   ALKPHOS 155 (H) 11/30/2022   AST 38 11/30/2022   ALT 22 11/30/2022   ANIONGAP 10 11/30/2022    CBG (last 3)  No results for input(s): "GLUCAP" in the last 72 hours.    Coagulation Profile: Recent Labs  Lab 11/29/22 0027  INR 1.2     Radiology Studies: I have personally reviewed the imaging studies  ECHOCARDIOGRAM COMPLETE  Result Date: 11/29/2022    ECHOCARDIOGRAM REPORT   Patient Name:   DEONKA DONOHOE Date of Exam: 11/29/2022 Medical Rec #:  [id number]   Height:       64.0 in Accession #:    [id number]  Weight:       144.8 lb Date of Birth:  1955-10-18    BSA:          1.706 m Patient Age:    66 years    BP:           133/80 mmHg Patient Gender: F           HR:           84 bpm. Exam Location:  Inpatient Procedure: 2D Echo, Cardiac Doppler and Color Doppler Indications:    Other cardiac sounds R01.2  History:        Patient has no prior history of Echocardiogram examinations.                 COPD; Risk Factors:Hypertension, Dyslipidemia and Current                 Smoker. History of substance and  ETOH abuse (HCC).  Sonographer:    Celesta Gentile RCS Referring Phys: [id number] SUBRINA SUNDIL IMPRESSIONS  1. Left ventricular ejection fraction, by estimation, is 65 to 70%. The left ventricle has normal function. The left ventricle has no regional wall motion abnormalities. There is mild left ventricular hypertrophy. Left ventricular diastolic parameters are consistent with Grade I diastolic dysfunction (impaired relaxation).  2. Right ventricular systolic function is normal. The right ventricular size is normal.  3. Left atrial size was severely dilated.  4. Right atrial size was severely dilated.  5. The mitral valve is normal in structure. No evidence of mitral valve regurgitation. No evidence of mitral stenosis.  6. The aortic valve is normal in structure. Aortic valve regurgitation is not visualized. No aortic stenosis is present.  7. The inferior vena cava is normal in size with greater than 50% respiratory variability, suggesting right atrial pressure of 3 mmHg. FINDINGS  Left Ventricle: Left ventricular ejection fraction, by estimation, is 65 to 70%. The left ventricle has normal function. The left ventricle has no regional wall motion abnormalities. The left ventricular internal cavity size was normal in size. There is  mild left ventricular  hypertrophy. Left ventricular diastolic parameters are consistent with Grade I diastolic dysfunction (impaired relaxation). Right Ventricle: The right ventricular size is normal. No increase in right ventricular wall thickness. Right ventricular systolic function is normal. Left Atrium: Left atrial size was severely dilated. Right Atrium: Right atrial size was severely dilated. Pericardium: There is no evidence of pericardial effusion. Mitral Valve: The mitral valve is normal in structure. No evidence of mitral valve regurgitation. No evidence of mitral valve stenosis. Tricuspid Valve: The tricuspid valve is normal in structure. Tricuspid valve regurgitation is not  demonstrated. No evidence of tricuspid stenosis. Aortic Valve: The aortic valve is normal in structure. Aortic valve regurgitation is not visualized. No aortic stenosis is present. Aortic valve mean gradient measures 8.0 mmHg. Aortic valve peak gradient measures 16.6 mmHg. Aortic valve area, by VTI measures 1.81 cm. Pulmonic Valve: The pulmonic valve was normal in structure. Pulmonic valve regurgitation is not visualized. No evidence of pulmonic stenosis. Aorta: The aortic root is normal in size and structure. Venous: The inferior vena cava is normal in size with greater than 50% respiratory variability, suggesting right atrial pressure of 3 mmHg. IAS/Shunts: No atrial level shunt detected by color flow Doppler.  LEFT VENTRICLE PLAX 2D LVIDd:         5.40 cm   Diastology LVIDs:         3.20 cm   LV e' medial:    8.81 cm/s LV PW:         1.20 cm   LV E/e' medial:  10.9 LV IVS:        0.90 cm   LV e' lateral:   10.20 cm/s LVOT diam:     1.85 cm   LV E/e' lateral: 9.5 LV SV:         69 LV SV Index:   41 LVOT Area:     2.69 cm  RIGHT VENTRICLE RV S prime:     21.80 cm/s TAPSE (M-mode): 2.7 cm LEFT ATRIUM              Index        RIGHT ATRIUM           Index LA diam:        3.90 cm  2.29 cm/m   RA Area:     23.60 cm LA Vol (A2C):   99.1 ml  58.10 ml/m  RA Volume:   73.00 ml  42.80 ml/m LA Vol (A4C):   120.0 ml 70.35 ml/m LA Biplane Vol: 117.0 ml 68.59 ml/m  AORTIC VALVE AV Area (Vmax):    1.86 cm AV Area (Vmean):   2.03 cm AV Area (VTI):     1.81 cm AV Vmax:           204.00 cm/s AV Vmean:          127.000 cm/s AV VTI:            0.383 m AV Peak Grad:      16.6 mmHg AV Mean Grad:      8.0 mmHg LVOT Vmax:         141.00 cm/s LVOT Vmean:        96.000 cm/s LVOT VTI:          0.258 m LVOT/AV VTI ratio: 0.67  AORTA Ao Root diam: 3.40 cm MITRAL VALVE MV Area (PHT): 4.06 cm    SHUNTS MV Decel Time: 187 msec    Systemic VTI:  0.26 m MV E velocity: 96.40 cm/s  Systemic Diam: 1.85 cm MV A velocity: 93.00 cm/s MV E/A  ratio:  1.04 Donato Schultz MD Electronically signed by Donato Schultz MD Signature Date/Time: 11/29/2022/4:16:23 PM    Final    US ABDOMEN LIMITED WITH LIVER DOPPLER  Result Date: 11/28/2022 CLINICAL DATA:  Cirrhosis EXAM: DUPLEX ULTRASOUND OF LIVER TECHNIQUE: Color and duplex Doppler ultrasound was performed to evaluate the hepatic in-flow and out-flow vessels. COMPARISON:  11/28/2022 FINDINGS: Liver: Normal parenchymal echogenicity. Nodularity of the liver capsule as identified on earlier CT, consistent with cirrhosis. No focal lesion, mass or intrahepatic biliary ductal dilatation. Gallbladder: No evidence of cholelithiasis or cholecystitis. Common bile duct: 8 mm. Main Portal Vein size: 1.4 cm Portal Vein Velocities Main Prox:  32.5 cm/sec Main Mid: 20.4 cm/sec Main Dist:  28.1 cm/sec Right: 22.2 cm/sec Left: 19.5 cm/sec Normal directional flow within the portal vein. Hepatic Vein Velocities Right:  7.0 cm/sec Middle:  60.5 cm/sec Left:  50.8 cm/sec Normal directional flow within the hepatic veins. IVC: Present and patent with normal respiratory phasicity. Hepatic Artery Velocity:  92.5 cm/sec Splenic Vein Velocity:  15.4 cm/sec Spleen: 10.8 cm x 5.9 cm x 6.2 cm with a total volume of 206 cm^3 (411 cm^3 is upper limit normal) Portal Vein Occlusion/Thrombus: No Splenic Vein Occlusion/Thrombus: No Ascites: None Varices: None IMPRESSION: 1. Nodular contour of the liver consistent with cirrhosis. No focal liver abnormality. 2. Unremarkable Doppler evaluation. Normal directional flow within the portal vein. Electronically Signed   By: Sharlet Salina M.D.   On: 11/28/2022 23:02   CT Angio Chest PE W and/or Wo Contrast  Result Date: 11/28/2022 CLINICAL DATA:  Shortness of breath.  PE suspected EXAM: CT ANGIOGRAPHY CHEST WITH CONTRAST TECHNIQUE: Multidetector CT imaging of the chest was performed using the standard protocol during bolus administration of intravenous contrast. Multiplanar CT image reconstructions and  MIPs were obtained to evaluate the vascular anatomy. RADIATION DOSE REDUCTION: This exam was performed according to the departmental dose-optimization program which includes automated exposure control, adjustment of the mA and/or kV according to patient size and/or use of iterative reconstruction technique. CONTRAST:  75mL OMNIPAQUE IOHEXOL 350 MG/ML SOLN COMPARISON:  Chest radiograph 11/28/2022 and CTA chest 01/31/2019 FINDINGS: Cardiovascular: Negative for acute pulmonary embolism. Normal caliber thoracic aorta. No pericardial effusion. Coronary artery and aortic atherosclerotic calcification. Mediastinum/Nodes: Layering debris in the trachea. Unremarkable esophagus. Prominent mediastinal and hilar nodes. For example 11 mm right hilar node on 06/183. Lungs/Pleura: Layering debris in the right mainstem bronchus. Additional mucous plugging/debris in the right lower lobe bronchi. Patchy ground-glass and consolidative opacities with interlobular septal thickening bilaterally greatest in the right lower lobe. No pleural effusion or pneumothorax. Mosaic attenuation compatible with air trapping. Biapical pleural-parenchymal scarring. Multiple predominantly subpleural pulmonary nodules measuring up to 6 mm are similar to 2020. No follow-up recommended. Upper Abdomen: Nodular contour of the liver compatible with cirrhosis. No acute abnormality. Musculoskeletal: No acute fracture. Review of the MIP images confirms the above findings. IMPRESSION: 1. Negative for acute pulmonary embolism. 2. Right lower lobe bronchopneumonia. 3. Cirrhosis. Aortic Atherosclerosis (ICD10-I70.0). Electronically Signed   By: Minerva Fester M.D.   On: 11/28/2022 20:33   DG Chest 2 View  Result Date: 11/28/2022 CLINICAL DATA:  COPD. Shortness of breath with low oxygen saturations. EXAM: CHEST - 2 VIEW COMPARISON:  01/31/2019 FINDINGS: Borderline heart size with normal pulmonary vascularity. Emphysematous changes in the lungs. Hazy interstitial  pattern consistent with respiratory bronchiolitis. No consolidation or edema. No pleural effusions. No pneumothorax. Mediastinal contours  appear intact. Calcified and tortuous aorta. Degenerative changes in the spine. IMPRESSION: Emphysematous and chronic bronchitic changes in the lungs. No focal consolidation. Electronically Signed   By: Burman Nieves M.D.   On: 11/28/2022 19:19       Christina Waldrop M.D. [facility] 11/30/2022, 12:34 PM  Available via Epic secure chat 7am-7pm After 7 pm, please refer to night coverage provider listed on amion.

## 2022-12-01 DIAGNOSIS — K7460 Unspecified cirrhosis of liver: Secondary | ICD-10-CM | POA: Diagnosis not present

## 2022-12-01 DIAGNOSIS — J189 Pneumonia, unspecified organism: Secondary | ICD-10-CM | POA: Diagnosis not present

## 2022-12-01 DIAGNOSIS — J9601 Acute respiratory failure with hypoxia: Secondary | ICD-10-CM | POA: Diagnosis not present

## 2022-12-01 LAB — CULTURE, RESPIRATORY W GRAM STAIN: Culture: NORMAL

## 2022-12-01 LAB — CBC
HCT: 31 % — ABNORMAL LOW (ref 36.0–46.0)
Hemoglobin: 9 g/dL — ABNORMAL LOW (ref 12.0–15.0)
MCH: 22.6 pg — ABNORMAL LOW (ref 26.0–34.0)
MCHC: 29 g/dL — ABNORMAL LOW (ref 30.0–36.0)
MCV: 77.9 fL — ABNORMAL LOW (ref 80.0–100.0)
Platelets: 365 10*3/uL (ref 150–400)
RBC: 3.98 MIL/uL (ref 3.87–5.11)
RDW: 21.8 % — ABNORMAL HIGH (ref 11.5–15.5)
WBC: 7.5 10*3/uL (ref 4.0–10.5)
nRBC: 0 % (ref 0.0–0.2)

## 2022-12-01 LAB — COMPREHENSIVE METABOLIC PANEL
ALT: 28 U/L (ref 0–44)
AST: 59 U/L — ABNORMAL HIGH (ref 15–41)
Albumin: 3.1 g/dL — ABNORMAL LOW (ref 3.5–5.0)
Alkaline Phosphatase: 166 U/L — ABNORMAL HIGH (ref 38–126)
Anion gap: 10 (ref 5–15)
BUN: 13 mg/dL (ref 8–23)
CO2: 22 mmol/L (ref 22–32)
Calcium: 8.4 mg/dL — ABNORMAL LOW (ref 8.9–10.3)
Chloride: 99 mmol/L (ref 98–111)
Creatinine, Ser: 0.85 mg/dL (ref 0.44–1.00)
GFR, Estimated: >60 mL/min (ref >60)
Glucose, Bld: 166 mg/dL — ABNORMAL HIGH (ref 70–99)
Potassium: 4.7 mmol/L (ref 3.5–5.1)
Sodium: 131 mmol/L — ABNORMAL LOW (ref 135–145)
Total Bilirubin: 0.8 mg/dL (ref 0.3–1.2)
Total Protein: 8.3 g/dL — ABNORMAL HIGH (ref 6.5–8.1)

## 2022-12-01 MED ORDER — FUROSEMIDE 10 MG/ML IJ SOLN
20.0000 mg | Freq: Once | INTRAMUSCULAR | Status: AC
  Administered 2022-12-01: 20 mg via INTRAVENOUS
  Filled 2022-12-01: qty 2

## 2022-12-01 NOTE — Progress Notes (Signed)
SATURATION QUALIFICATIONS: (This note is used to comply with regulatory documentation for home oxygen)  Patient Saturations on Room Air at Rest = 90%  Patient Saturations on Room Air while Ambulating = 84%  Patient Saturations on 3 Liters of oxygen while Ambulating = 94%  Please briefly explain why patient needs home oxygen: Desats on RA and 2L down 90%

## 2022-12-01 NOTE — Plan of Care (Signed)
  Problem: Activity: Goal: Ability to tolerate increased activity will improve Outcome: Progressing   Problem: Coping: Goal: Level of anxiety will decrease Outcome: Progressing   Problem: Safety: Goal: Ability to remain free from injury will improve Outcome: Progressing

## 2022-12-01 NOTE — Care Management Important Message (Signed)
Important Message  Patient Details  Name: [name] MRN: [id number] Date of Birth: Jul 22, 1955   Medicare Important Message Given:  Yes     Renie Ora 12/01/2022, 9:59 AM

## 2022-12-01 NOTE — Progress Notes (Signed)
[facility]                                                                              Kathryn Banks, is a 67 y.o. female, DOB - [birth year], ZOX:[id number] Admit date - 11/28/2022    Outpatient Primary MD for the patient is Kathryn Humphrey, MD  LOS - 3  days  Chief Complaint  Patient presents with   Shortness of Breath       Brief summary   Patient is a 67 year old female COPD, chronic smoking, HTN, depression presented to ED with shortness of breath, progressively getting worse with cough and sputum production in the last 24 hours.  Also complaining dizziness, smokes 1 pack/day. In ED, patient was noted to be hypoxic, O2 sats 79% on room air, improved to 92% on 2 L CT chest ruled out PE however showed right lower lobe bronchopneumonia, cirrhosis, aortic atherosclerosis.  Patient admitted for further workup  Assessment & Plan    Principal Problem:   Acute hypoxic respiratory failure (HCC) secondary to COPD with acute exacerbation, right lower lung pneumonia -CTA chest ruled out PE however showed right lower lobe pneumonia. -Home O2 evaluation done today, at rest 90%, on ambulating 84%, placed on 3 L  -Continue current management today, repeat home O2 evaluation tomorrow   Mild acute on chronic diastolic CHF -Elevated BNP 355 -2D echo showed EF of 65 to 70%, G1 DD, severely dilated right and left atrial size, normal right ventricular systolic function -Patient received Lasix 20 mg IV daily x 2, negative balance of 4.2 L, weight down from 147-> 140 lbs -Home O2 evaluation completed, desats on ambulation, required 3 L O2, O2 sats 88 to 91% on 3 L -Will give 1 extra dose of Lasix 20 mg IV x 1 today     COPD with acute exacerbation (HCC) -Continue IV Solu-Medrol, scheduled DuoNebs -Placed on Pulmicort, Brovana, flutter valve -Continue IV Zithromax, Rocephin -Repeat home O2 evaluation in a.m.    Right lower lobe pneumonia -Continue IV Zithromax, IV  Rocephin -Continue Tessalon and Mucinex as needed -Respiratory virus panel negative, urine strep antigen negative, urine Legionella antigen still pending -Blood cultures negative so far  Microcytic anemia, folic acid deficiency, iron deficiency -Hemoglobin on presentation 8.8, low MCV,  -Anemia panel showed B12 273, folate very low 2.2 -Iron panel Fe 19, TIBC 470, percent saturation ratio 4, ferritin 20.  - colonoscopy once acute respiratory illness is resolved.  FOBT negative. -Start folic acid 1 mg daily.  -IV iron x 2 doses -Hemoglobin stable at 9.0 today  Nicotine abuse -Counseled strongly on smoking cessation Continue nicotine patch    Essential hypertension -Continue amlodipine, Lopressor    History of depression, anxiety -Continue Zoloft     Cirrhosis (HCC), history of alcohol use -CT chest reported cirrhosis, patient has a history of chronic alcohol use -Abdominal ultrasound showed nodular contour of the liver consistent with cirrhosis, no focal liver abnormality, unremarkable Doppler evaluation -LFTs showed AST > ALT, elevated alk phos.  PLT, INR normal - Counseled strongly on alcohol cessation -Continue CIWA with Ativan protocol, thiamine, folate    Hyperlipidemia -Hold statin  until LFTs normalize   Estimated body mass index is 24.18 kg/m as calculated from the following:   Height as of this encounter: 5\' 4"  (1.626 m).   Weight as of this encounter: 63.9 kg.  Code Status: Full code DVT Prophylaxis:  enoxaparin (LOVENOX) injection 40 mg Start: 11/29/22 0800 SCDs Start: 11/28/22 2116   Level of Care: Level of care: Telemetry Medical Family Communication: Updated patient Disposition Plan:      Remains inpatient appropriate: Hopefully DC home tomorrow, repeat home O2 evaluation in a.m.   Procedures:  2D echo  Consultants:   None  Antimicrobials:   Anti-infectives (From admission, onward)    Start     Dose/Rate Route Frequency Ordered Stop   11/30/22  2200  azithromycin (ZITHROMAX) tablet 500 mg        500 mg Oral Daily 11/30/22 1259 12/04/22 0959   11/29/22 2200  cefTRIAXone (ROCEPHIN) 1 g in sodium chloride 0.9 % 100 mL IVPB        1 g 200 mL/hr over 30 Minutes Intravenous Every 24 hours 11/28/22 2115 12/06/22 2159   11/29/22 2200  azithromycin (ZITHROMAX) 500 mg in sodium chloride 0.9 % 250 mL IVPB  Status:  Discontinued        500 mg 250 mL/hr over 60 Minutes Intravenous Every 24 hours 11/28/22 2115 11/30/22 1259   11/28/22 2100  cefTRIAXone (ROCEPHIN) 1 g in sodium chloride 0.9 % 100 mL IVPB        1 g 200 mL/hr over 30 Minutes Intravenous  Once 11/28/22 2047 11/28/22 2221   11/28/22 2100  azithromycin (ZITHROMAX) 500 mg in sodium chloride 0.9 % 250 mL IVPB        500 mg 250 mL/hr over 60 Minutes Intravenous  Once 11/28/22 2047 11/29/22 0128          Medications  amLODipine  5 mg Oral Daily   arformoterol  15 mcg Nebulization BID   azithromycin  500 mg Oral Daily   budesonide (PULMICORT) nebulizer solution  0.25 mg Nebulization BID   enoxaparin (LOVENOX) injection  40 mg Subcutaneous Q24H   folic acid  1 mg Oral Daily   ipratropium-albuterol  3 mL Nebulization Q6H   methylPREDNISolone (SOLU-MEDROL) injection  40 mg Intravenous Q24H   metoprolol tartrate  12.5 mg Oral BID   nicotine  21 mg Transdermal Daily   pantoprazole  40 mg Oral Daily   sertraline  25 mg Oral QHS   sodium chloride flush  3 mL Intravenous Q12H      Subjective:   Doniesha Rhines was seen and examined today.  Overall improving, still mild wheezing at the bases, no fever or chills, no chest pain or dyspnea, no nausea vomiting or diarrhea  Objective:   Vitals:   12/01/22 0822 12/01/22 0845 12/01/22 1114 12/01/22 1159  BP: 128/71   127/87  Pulse: 86   81  Resp:  18  18  Temp:    97.9 F (36.6 C)  TempSrc:    Oral  SpO2: 91%  91% 91%  Weight:      Height:        Intake/Output Summary (Last 24 hours) at 12/01/2022 1314 Last data filed at  12/01/2022 0853 Gross per 24 hour  Intake 390 ml  Output 1300 ml  Net -910 ml     Wt Readings from Last 3 Encounters:  12/01/22 63.9 kg  02/01/19 66.7 kg  10/15/17 66.2 kg   Physical Exam General: Alert and oriented x  3, NAD Cardiovascular: S1 S2 clear, RRR.  Respiratory: Wheezing at the bases, overall improving Gastrointestinal: Soft, nontender, nondistended, NBS Ext: no pedal edema bilaterally Neuro: no new deficits Psych: Normal affect    Data Reviewed:  I have personally reviewed following labs    CBC Lab Results  Component Value Date   WBC 7.5 12/01/2022   RBC 3.98 12/01/2022   HGB 9.0 (L) 12/01/2022   HCT 31.0 (L) 12/01/2022   MCV 77.9 (L) 12/01/2022   MCH 22.6 (L) 12/01/2022   PLT 365 12/01/2022   MCHC 29.0 (L) 12/01/2022   RDW 21.8 (H) 12/01/2022   LYMPHSABS 3.1 04/22/2013   MONOABS 0.6 04/22/2013   EOSABS 0.1 04/22/2013   BASOSABS 0.1 04/22/2013     Last metabolic panel Lab Results  Component Value Date   NA 131 (L) 12/01/2022   K 4.7 12/01/2022   CL 99 12/01/2022   CO2 22 12/01/2022   BUN 13 12/01/2022   CREATININE 0.85 12/01/2022   GLUCOSE 166 (H) 12/01/2022   GFRNONAA >60 12/01/2022   GFRAA >60 01/31/2019   CALCIUM 8.4 (L) 12/01/2022   PROT 8.3 (H) 12/01/2022   ALBUMIN 3.1 (L) 12/01/2022   BILITOT 0.8 12/01/2022   ALKPHOS 166 (H) 12/01/2022   AST 59 (H) 12/01/2022   ALT 28 12/01/2022   ANIONGAP 10 12/01/2022    CBG (last 3)  No results for input(s): "GLUCAP" in the last 72 hours.    Coagulation Profile: Recent Labs  Lab 11/29/22 0027  INR 1.2     Radiology Studies: I have personally reviewed the imaging studies  ECHOCARDIOGRAM COMPLETE  Result Date: 11/29/2022    ECHOCARDIOGRAM REPORT   Patient Name:   Kathryn Banks Date of Exam: 11/29/2022 Medical Rec #:  [id number]   Height:       64.0 in Accession #:    [id number]  Weight:       144.8 lb Date of Birth:  Aug 26, 1955    BSA:          1.706 m Patient Age:    66 years    BP:            133/80 mmHg Patient Gender: F           HR:           84 bpm. Exam Location:  Inpatient Procedure: 2D Echo, Cardiac Doppler and Color Doppler Indications:    Other cardiac sounds R01.2  History:        Patient has no prior history of Echocardiogram examinations.                 COPD; Risk Factors:Hypertension, Dyslipidemia and Current                 Smoker. History of substance and ETOH abuse (HCC).  Sonographer:    Celesta Gentile RCS Referring Phys: [id number] SUBRINA SUNDIL IMPRESSIONS  1. Left ventricular ejection fraction, by estimation, is 65 to 70%. The left ventricle has normal function. The left ventricle has no regional wall motion abnormalities. There is mild left ventricular hypertrophy. Left ventricular diastolic parameters are consistent with Grade I diastolic dysfunction (impaired relaxation).  2. Right ventricular systolic function is normal. The right ventricular size is normal.  3. Left atrial size was severely dilated.  4. Right atrial size was severely dilated.  5. The mitral valve is normal in structure. No evidence of mitral valve regurgitation. No evidence of mitral stenosis.  6. The aortic valve is normal  in structure. Aortic valve regurgitation is not visualized. No aortic stenosis is present.  7. The inferior vena cava is normal in size with greater than 50% respiratory variability, suggesting right atrial pressure of 3 mmHg. FINDINGS  Left Ventricle: Left ventricular ejection fraction, by estimation, is 65 to 70%. The left ventricle has normal function. The left ventricle has no regional wall motion abnormalities. The left ventricular internal cavity size was normal in size. There is  mild left ventricular hypertrophy. Left ventricular diastolic parameters are consistent with Grade I diastolic dysfunction (impaired relaxation). Right Ventricle: The right ventricular size is normal. No increase in right ventricular wall thickness. Right ventricular systolic function is normal. Left Atrium:  Left atrial size was severely dilated. Right Atrium: Right atrial size was severely dilated. Pericardium: There is no evidence of pericardial effusion. Mitral Valve: The mitral valve is normal in structure. No evidence of mitral valve regurgitation. No evidence of mitral valve stenosis. Tricuspid Valve: The tricuspid valve is normal in structure. Tricuspid valve regurgitation is not demonstrated. No evidence of tricuspid stenosis. Aortic Valve: The aortic valve is normal in structure. Aortic valve regurgitation is not visualized. No aortic stenosis is present. Aortic valve mean gradient measures 8.0 mmHg. Aortic valve peak gradient measures 16.6 mmHg. Aortic valve area, by VTI measures 1.81 cm. Pulmonic Valve: The pulmonic valve was normal in structure. Pulmonic valve regurgitation is not visualized. No evidence of pulmonic stenosis. Aorta: The aortic root is normal in size and structure. Venous: The inferior vena cava is normal in size with greater than 50% respiratory variability, suggesting right atrial pressure of 3 mmHg. IAS/Shunts: No atrial level shunt detected by color flow Doppler.  LEFT VENTRICLE PLAX 2D LVIDd:         5.40 cm   Diastology LVIDs:         3.20 cm   LV e' medial:    8.81 cm/s LV PW:         1.20 cm   LV E/e' medial:  10.9 LV IVS:        0.90 cm   LV e' lateral:   10.20 cm/s LVOT diam:     1.85 cm   LV E/e' lateral: 9.5 LV SV:         69 LV SV Index:   41 LVOT Area:     2.69 cm  RIGHT VENTRICLE RV S prime:     21.80 cm/s TAPSE (M-mode): 2.7 cm LEFT ATRIUM              Index        RIGHT ATRIUM           Index LA diam:        3.90 cm  2.29 cm/m   RA Area:     23.60 cm LA Vol (A2C):   99.1 ml  58.10 ml/m  RA Volume:   73.00 ml  42.80 ml/m LA Vol (A4C):   120.0 ml 70.35 ml/m LA Biplane Vol: 117.0 ml 68.59 ml/m  AORTIC VALVE AV Area (Vmax):    1.86 cm AV Area (Vmean):   2.03 cm AV Area (VTI):     1.81 cm AV Vmax:           204.00 cm/s AV Vmean:          127.000 cm/s AV VTI:             0.383 m AV Peak Grad:      16.6 mmHg AV Mean Grad:  8.0 mmHg LVOT Vmax:         141.00 cm/s LVOT Vmean:        96.000 cm/s LVOT VTI:          0.258 m LVOT/AV VTI ratio: 0.67  AORTA Ao Root diam: 3.40 cm MITRAL VALVE MV Area (PHT): 4.06 cm    SHUNTS MV Decel Time: 187 msec    Systemic VTI:  0.26 m MV E velocity: 96.40 cm/s  Systemic Diam: 1.85 cm MV A velocity: 93.00 cm/s MV E/A ratio:  1.04 Donato Schultz MD Electronically signed by Donato Schultz MD Signature Date/Time: 11/29/2022/4:16:23 PM    Final        Thad Ranger M.D. [facility] 12/01/2022, 1:14 PM  Available via Epic secure chat 7am-7pm After 7 pm, please refer to night coverage provider listed on amion.

## 2022-12-01 NOTE — TOC Initial Note (Signed)
Transition of Care [facility]) - Initial/Assessment Note    Patient Details  Name: Kathryn Banks MRN: [id number] Date of Birth: 05-Jun-1955  [facility] CM/SW Contact:    Leone Haven, RN Phone Number: 12/01/2022, 11:15 AM  Clinical Narrative:                 From home with boyfriend, has PCP and insurance on file, states has no HH services in place at this time or DME at home.  States boyfriend will transport her home at [employer] and he is support system, states gets medications from [facility] on [address] in [place].  Pta self ambulatory   Expected Discharge Plan: Home/Self Care Barriers to Discharge: No Barriers Identified   Patient Goals and CMS Choice Patient states their goals for this hospitalization and ongoing recovery are:: return home   Choice offered to / list presented to : NA      Expected Discharge Plan and Services In-house Referral: NA   Post Acute Care Choice: NA Living arrangements for the past 2 months: Single Family Home                 DME Arranged: N/A DME Agency: NA       HH Arranged: NA          Prior Living Arrangements/Services Living arrangements for the past 2 months: Single Family Home Lives with:: Significant Other Patient language and need for interpreter reviewed:: Yes Do you feel safe going back to the place where you live?: Yes      Need for Family Participation in Patient Care: Yes (Comment) Care giver support system in place?: Yes (comment)   Criminal Activity/Legal Involvement Pertinent to Current Situation/Hospitalization: No - Comment as needed  Activities of Daily Living Home Assistive Devices/Equipment: Dentures (specify type) (top) ADL Screening (condition at time of admission) Patient's cognitive ability adequate to safely complete daily activities?: Yes Is the patient deaf or have difficulty hearing?: No Does the patient have difficulty seeing, even when wearing glasses/contacts?: No Does the patient have  difficulty concentrating, remembering, or making decisions?: No Patient able to express need for assistance with ADLs?: Yes Does the patient have difficulty dressing or bathing?: No Independently performs ADLs?: Yes (appropriate for developmental age) Does the patient have difficulty walking or climbing stairs?: No Weakness of Legs: None Weakness of Arms/Hands: None  Permission Sought/Granted Permission sought to share information with : Case Manager Permission granted to share information with : Yes, Verbal Permission Granted              Emotional Assessment   Attitude/Demeanor/Rapport: Engaged Affect (typically observed): Appropriate Orientation: : Oriented to Self, Oriented to Place, Oriented to  Time Alcohol / Substance Use: Not Applicable Psych Involvement: No (comment)  Admission diagnosis:  Hypoxia [R09.02] COPD with acute exacerbation (HCC) [J44.1] Community acquired pneumonia of right lower lobe of lung [J18.9] Patient Active Problem List   Diagnosis Date Noted   Acute hypoxic respiratory failure (HCC) 11/28/2022   Right lower lobe pneumonia 11/28/2022   Continuous dependence on cigarette smoking 11/28/2022   History of depression 11/28/2022   Cirrhosis (HCC) 11/28/2022   GAD (generalized anxiety disorder) 11/28/2022   Microcytic anemia 11/28/2022   Hyperlipidemia 11/28/2022   Elevated brain natriuretic peptide (BNP) level 11/28/2022   Subclinical hypothyroidism 02/01/2019   Chest pain 01/31/2019   Essential hypertension 01/31/2019   COPD with acute exacerbation (HCC) 01/31/2019   Tobacco dependence 01/31/2019   History of substance abuse (HCC) 01/31/2019  Pulmonary nodules/lesions, multiple 01/31/2019   PCP:  [name], MD Pharmacy:   [facility] [address], [place] - [address] [place] [address] Phone: [phone] Fax: [phone]  [facility] [address] [place]  [address] Phone: [phone] Fax: [phone]     Social Determinants of Health (SDOH) Social History: SDOH Screenings   Food Insecurity: No Food Insecurity (11/28/2022)  Housing: Low Risk  (11/28/2022)  Transportation Needs: No Transportation Needs (11/28/2022)  Utilities: Not At Risk (11/28/2022)  Tobacco Use: High Risk (11/28/2022)   SDOH Interventions:     Readmission Risk Interventions     No data to display

## 2022-12-02 ENCOUNTER — Telehealth: Payer: Self-pay | Admitting: Physician Assistant

## 2022-12-02 ENCOUNTER — Inpatient Hospital Stay (HOSPITAL_COMMUNITY): Payer: Medicare Other

## 2022-12-02 ENCOUNTER — Encounter (HOSPITAL_COMMUNITY): Payer: Self-pay | Admitting: Internal Medicine

## 2022-12-02 DIAGNOSIS — J441 Chronic obstructive pulmonary disease with (acute) exacerbation: Secondary | ICD-10-CM | POA: Diagnosis not present

## 2022-12-02 DIAGNOSIS — R7989 Other specified abnormal findings of blood chemistry: Secondary | ICD-10-CM | POA: Diagnosis not present

## 2022-12-02 DIAGNOSIS — I5031 Acute diastolic (congestive) heart failure: Secondary | ICD-10-CM

## 2022-12-02 DIAGNOSIS — J189 Pneumonia, unspecified organism: Secondary | ICD-10-CM | POA: Diagnosis not present

## 2022-12-02 DIAGNOSIS — J9601 Acute respiratory failure with hypoxia: Secondary | ICD-10-CM | POA: Diagnosis not present

## 2022-12-02 DIAGNOSIS — F411 Generalized anxiety disorder: Secondary | ICD-10-CM | POA: Diagnosis not present

## 2022-12-02 LAB — RESPIRATORY PANEL BY PCR

## 2022-12-02 LAB — BLOOD GAS, ARTERIAL
Acid-base deficit: 0.6 mmol/L (ref 0.0–2.0)
Bicarbonate: 24.9 mmol/L (ref 20.0–28.0)
Drawn by: 137461
O2 Saturation: 93.3 %
Patient temperature: 36.8
pCO2 arterial: 43 mmHg (ref 32–48)
pH, Arterial: 7.37 (ref 7.35–7.45)
pO2, Arterial: 67 mmHg — ABNORMAL LOW (ref 83–108)

## 2022-12-02 LAB — COMPREHENSIVE METABOLIC PANEL
ALT: 30 U/L (ref 0–44)
AST: 50 U/L — ABNORMAL HIGH (ref 15–41)
Albumin: 3.1 g/dL — ABNORMAL LOW (ref 3.5–5.0)
Alkaline Phosphatase: 152 U/L — ABNORMAL HIGH (ref 38–126)
Anion gap: 7 (ref 5–15)
BUN: 17 mg/dL (ref 8–23)
CO2: 24 mmol/L (ref 22–32)
Calcium: 8.9 mg/dL (ref 8.9–10.3)
Chloride: 103 mmol/L (ref 98–111)
Creatinine, Ser: 0.98 mg/dL (ref 0.44–1.00)
GFR, Estimated: >60 mL/min (ref >60)
Glucose, Bld: 118 mg/dL — ABNORMAL HIGH (ref 70–99)
Potassium: 4.8 mmol/L (ref 3.5–5.1)
Sodium: 134 mmol/L — ABNORMAL LOW (ref 135–145)
Total Bilirubin: 1 mg/dL (ref 0.3–1.2)
Total Protein: 8.5 g/dL — ABNORMAL HIGH (ref 6.5–8.1)

## 2022-12-02 LAB — BRAIN NATRIURETIC PEPTIDE: B Natriuretic Peptide: 154.2 pg/mL — ABNORMAL HIGH (ref 0.0–100.0)

## 2022-12-02 LAB — CBC
HCT: 29.8 % — ABNORMAL LOW (ref 36.0–46.0)
Hemoglobin: 8.6 g/dL — ABNORMAL LOW (ref 12.0–15.0)
MCH: 22.1 pg — ABNORMAL LOW (ref 26.0–34.0)
MCHC: 28.9 g/dL — ABNORMAL LOW (ref 30.0–36.0)
MCV: 76.4 fL — ABNORMAL LOW (ref 80.0–100.0)
Platelets: 365 10*3/uL (ref 150–400)
RBC: 3.9 MIL/uL (ref 3.87–5.11)
RDW: 22 % — ABNORMAL HIGH (ref 11.5–15.5)
WBC: 7.7 10*3/uL (ref 4.0–10.5)
nRBC: 0 % (ref 0.0–0.2)

## 2022-12-02 LAB — LEGIONELLA PNEUMOPHILA SEROGP 1 UR AG: L. pneumophila Serogp 1 Ur Ag: NEGATIVE

## 2022-12-02 MED ORDER — MOMETASONE FURO-FORMOTEROL FUM 100-5 MCG/ACT IN AERO
2.0000 | INHALATION_SPRAY | Freq: Two times a day (BID) | RESPIRATORY_TRACT | Status: DC
  Administered 2022-12-02 – 2022-12-05 (×6): 2 via RESPIRATORY_TRACT
  Filled 2022-12-02: qty 8.8

## 2022-12-02 MED ORDER — REVEFENACIN 175 MCG/3ML IN SOLN: 175.0000 ug | Freq: Every day | RESPIRATORY_TRACT | Status: DC

## 2022-12-02 MED ORDER — UMECLIDINIUM BROMIDE 62.5 MCG/ACT IN AEPB
1.0000 | INHALATION_SPRAY | Freq: Every day | RESPIRATORY_TRACT | Status: DC
  Administered 2022-12-03 – 2022-12-05 (×3): 1 via RESPIRATORY_TRACT
  Filled 2022-12-02: qty 7

## 2022-12-02 MED ORDER — FUROSEMIDE 10 MG/ML IJ SOLN
20.0000 mg | Freq: Once | INTRAMUSCULAR | Status: AC
  Administered 2022-12-02: 20 mg via INTRAVENOUS
  Filled 2022-12-02: qty 2

## 2022-12-02 NOTE — Progress Notes (Signed)
Heart Failure Navigator Progress Note  Assessed for Heart & Vascular TOC clinic readiness.  Patient does not meet criteria due to EF 65-70%.   Navigator will sign off at this time.   Kaj Vasil,RN, BSN,MSN Heart Failure Nurse Navigator. Contact by secure chat only.

## 2022-12-02 NOTE — Progress Notes (Addendum)
[facility]                                                                              Kathryn Banks, is a 66 y.o. female, DOB - [birth year], YNW:[id number] Admit date - 11/28/2022    Outpatient Primary MD for the patient is [name], MD  LOS - 4  days  Chief Complaint  Patient presents with   Shortness of Breath       Brief summary   Patient is a 67 year old female COPD, chronic smoking, HTN, depression presented to ED with shortness of breath, progressively getting worse with cough and sputum production in the last 24 hours.  Also complaining dizziness, smokes 1 pack/day. In ED, patient was noted to be hypoxic, O2 sats 79% on room air, improved to 92% on 2 L CT chest ruled out PE however showed right lower lobe bronchopneumonia, cirrhosis, aortic atherosclerosis.  Patient admitted for further workup  Assessment & Plan    Principal Problem:   Acute hypoxic respiratory failure (HCC) secondary to COPD with acute exacerbation, right lower lung pneumonia -CTA chest on admission ruled out PE however showed right lower lobe pneumonia. -Not on any home O2 as baseline - home O2 evaluation done today with worsening hypoxia with O2 sats in 79% at rest and 76% on ambulation, required 8 L O2 -Home O2 eval repeated after some time again with VS & orthostatic vital signs, no orthostasis noted, vital signs otherwise stable, no palpitations or tachycardia.  However at rest now requiring 5 L O2 via [place]. -Patient was examined prior to the home O2 evaluation, was noted not to be in any respiratory distress -Will obtain stat chest x-ray, BNP, ABG, requested pulmonology evaluation   Mild acute on chronic diastolic CHF -Elevated BNP 355 -2D echo showed EF of 65 to 70%, G1 DD, severely dilated right and left atrial size, normal right ventricular systolic function -Patient received Lasix 20 mg IV daily, negative balance of 5.9 L, weight 147 lbs on admission-> 140lbs today      COPD with acute exacerbation (HCC) -Continue IV Solu-Medrol, scheduled DuoNebs -Placed on Pulmicort, Brovana, flutter valve -Continue IV Zithromax, Rocephin -As #1    Right lower lobe pneumonia -Continue IV Zithromax, IV Rocephin -Continue Tessalon and Mucinex as needed -Respiratory virus panel negative, urine strep antigen negative, urine Legionella antigen still pending -Blood cultures negative so far  Microcytic anemia, folic acid deficiency, iron deficiency -Hemoglobin on presentation 8.8, low MCV,  -Anemia panel showed B12 273, folate very low 2.2 -Iron panel Fe 19, TIBC 470, percent saturation ratio 4, ferritin 20.  -colonoscopy once acute respiratory illness is resolved.  FOBT negative. -Started folic acid 1 mg daily.  -IV iron x 2 doses, completed on 9/1 and 9/2 -H&H stable  Nicotine abuse -Counseled strongly on smoking cessation Continue nicotine patch    Essential hypertension -Continue amlodipine, Lopressor    History of depression, anxiety -Continue Zoloft     Cirrhosis (HCC), history of alcohol use -CT chest reported cirrhosis, patient has a history of chronic alcohol use -Abdominal ultrasound showed nodular contour of the liver consistent with cirrhosis, no focal  liver abnormality, unremarkable Doppler evaluation -LFTs showed AST > ALT, elevated alk phos.  PLT, INR normal - Counseled strongly on alcohol cessation -Patient is not in any alcohol withdrawals    Hyperlipidemia -Hold statin until LFTs normalize   Estimated body mass index is 24.18 kg/m as calculated from the following:   Height as of this encounter: 5\' 4"  (1.626 m).   Weight as of this encounter: 63.9 kg.  Code Status: Full code DVT Prophylaxis:  enoxaparin (LOVENOX) injection 40 mg Start: 11/29/22 0800 SCDs Start: 11/28/22 2116   Level of Care: Level of care: Telemetry Medical Family Communication: Updated patient Disposition Plan:      Remains inpatient appropriate: Still hypoxic and  worsening, pulmonology consulted   Procedures:  2D echo  Consultants:   Pulmonology  Antimicrobials:   Anti-infectives (From admission, onward)    Start     Dose/Rate Route Frequency Ordered Stop   11/30/22 2200  azithromycin (ZITHROMAX) tablet 500 mg        500 mg Oral Daily 11/30/22 1259 12/04/22 0959   11/29/22 2200  cefTRIAXone (ROCEPHIN) 1 g in sodium chloride 0.9 % 100 mL IVPB        1 g 200 mL/hr over 30 Minutes Intravenous Every 24 hours 11/28/22 2115 12/06/22 2159   11/29/22 2200  azithromycin (ZITHROMAX) 500 mg in sodium chloride 0.9 % 250 mL IVPB  Status:  Discontinued        500 mg 250 mL/hr over 60 Minutes Intravenous Every 24 hours 11/28/22 2115 11/30/22 1259   11/28/22 2100  cefTRIAXone (ROCEPHIN) 1 g in sodium chloride 0.9 % 100 mL IVPB        1 g 200 mL/hr over 30 Minutes Intravenous  Once 11/28/22 2047 11/28/22 2221   11/28/22 2100  azithromycin (ZITHROMAX) 500 mg in sodium chloride 0.9 % 250 mL IVPB        500 mg 250 mL/hr over 60 Minutes Intravenous  Once 11/28/22 2047 11/29/22 0128          Medications  amLODipine  5 mg Oral Daily   arformoterol  15 mcg Nebulization BID   azithromycin  500 mg Oral Daily   budesonide (PULMICORT) nebulizer solution  0.25 mg Nebulization BID   enoxaparin (LOVENOX) injection  40 mg Subcutaneous Q24H   folic acid  1 mg Oral Daily   ipratropium-albuterol  3 mL Nebulization Q6H   methylPREDNISolone (SOLU-MEDROL) injection  40 mg Intravenous Q24H   metoprolol tartrate  12.5 mg Oral BID   nicotine  21 mg Transdermal Daily   pantoprazole  40 mg Oral Daily   sertraline  25 mg Oral QHS   sodium chloride flush  3 mL Intravenous Q12H      Subjective:   Kathryn Banks was seen and examined today.  Still has some wheezing, no respiratory distress or chest pain, fevers or chills.   Objective:   Vitals:   12/02/22 1317 12/02/22 1319 12/02/22 1321 12/02/22 1326  BP: 128/80 (!) 124/91 122/80 127/83  Pulse: 83 93 87 87   Resp: 18     Temp:    98.3 F (36.8 C)  TempSrc:    Oral  SpO2: 91% (!) 88% (!) 88% 94%  Weight:      Height:        Intake/Output Summary (Last 24 hours) at 12/02/2022 1338 Last data filed at 12/02/2022 1134 Gross per 24 hour  Intake 705.71 ml  Output 1900 ml  Net -1194.29 ml  Wt Readings from Last 3 Encounters:  12/02/22 63.9 kg  02/01/19 66.7 kg  10/15/17 66.2 kg    Physical Exam General: Alert and oriented x 3, NAD, speaking in full sentences Cardiovascular: S1 S2 clear, RRR.  Respiratory: Wheezing at the bases Gastrointestinal: Soft, nontender, nondistended, NBS Ext: no pedal edema bilaterally Neuro: no new deficits Psych: Normal affect, pleasant  Data Reviewed:  I have personally reviewed following labs    CBC Lab Results  Component Value Date   WBC 7.7 12/02/2022   RBC 3.90 12/02/2022   HGB 8.6 (L) 12/02/2022   HCT 29.8 (L) 12/02/2022   MCV 76.4 (L) 12/02/2022   MCH 22.1 (L) 12/02/2022   PLT 365 12/02/2022   MCHC 28.9 (L) 12/02/2022   RDW 22.0 (H) 12/02/2022   LYMPHSABS 3.1 04/22/2013   MONOABS 0.6 04/22/2013   EOSABS 0.1 04/22/2013   BASOSABS 0.1 04/22/2013     Last metabolic panel Lab Results  Component Value Date   NA 134 (L) 12/02/2022   K 4.8 12/02/2022   CL 103 12/02/2022   CO2 24 12/02/2022   BUN 17 12/02/2022   CREATININE 0.98 12/02/2022   GLUCOSE 118 (H) 12/02/2022   GFRNONAA >60 12/02/2022   GFRAA >60 01/31/2019   CALCIUM 8.9 12/02/2022   PROT 8.5 (H) 12/02/2022   ALBUMIN 3.1 (L) 12/02/2022   BILITOT 1.0 12/02/2022   ALKPHOS 152 (H) 12/02/2022   AST 50 (H) 12/02/2022   ALT 30 12/02/2022   ANIONGAP 7 12/02/2022    CBG (last 3)  No results for input(s): "GLUCAP" in the last 72 hours.    Coagulation Profile: Recent Labs  Lab 11/29/22 0027  INR 1.2     Radiology Studies: I have personally reviewed the imaging studies  No results found.     Thad Ranger M.D. [facility] 12/02/2022, 1:38 PM  Available  via Epic secure chat 7am-7pm After 7 pm, please refer to night coverage provider listed on amion.

## 2022-12-02 NOTE — Plan of Care (Signed)

## 2022-12-02 NOTE — Progress Notes (Signed)
SATURATION QUALIFICATIONS: (This note is used to comply with regulatory documentation for home oxygen)  Patient Saturations on Room Air at Rest = 79 %  Patient Saturations on Room Air while Ambulating = 76%  Patient Saturations on 8 Liters of oxygen while Ambulating = 91%  Please briefly explain why patient needs home oxygen:

## 2022-12-02 NOTE — Consult Note (Addendum)
NAME:  Kathryn Banks, MRN:  [id number], DOB:  [birth year], LOS: 4 ADMISSION DATE:  11/28/2022, CONSULTATION DATE:  9/3 REFERRING MD:  Dr. Isidoro Donning, CHIEF COMPLAINT:  CHF exacerbation   History of Present Illness:  Patient is a 67 yo F w/ pertinent pmh copd, tobacco abuse presents to [facility] ED on 8/30 w/ SOB.  Patient states sob has been progressively worsening over the past 24 hours. Denies fever/chills, chest pain. Came to [facility] on 8/30. Sats 79% on room air and improved to 92% on 2L [place]. BNP 355. CXR showing emphysematious and chronic bronchiectatic change in lung; no consolidation. CTA ruled out PE; RLL bronchopneumonia. Patient admitted to floors and treated for CAP w/ azithro/rocephin. Given duoneb and solumedrol. Echo showing LVEF 65-70%; G1 DD. Patient given lasix. RVP, urine strep negative; legionella pending.   On 9/3 patient with increasing o2 requirements up to 8L [place] when walking w/ nurse. Consulted PCCM.   Pertinent  Medical History   Past Medical History:  Diagnosis Date   Acid reflux    Arthritis    FINGERS   COPD (chronic obstructive pulmonary disease) (HCC)    Depression    ETOH abuse    Hepatitis    C-PT STATED SHE WAS TOLD THIS 2014   Hypertension    OFF MEDS FOR FEW YEARS     Significant Hospital Events: Including procedures, antibiotic start and stop dates in addition to other pertinent events   8/30 ARF w/ hypoxia 9/3 increasing o2 requirements; pccm consulted  Interim History / Subjective:  Uop last 24 hours 1.5 L Afebrile; wbc 7.7 CXR pending  Objective   Blood pressure 127/83, pulse 87, temperature 98.3 F (36.8 C), temperature source Oral, resp. rate 18, height 5\' 4"  (1.626 m), weight 63.9 kg, last menstrual period 09/25/2005, SpO2 94%.    FiO2 (%):  [32 %] 32 %   Intake/Output Summary (Last 24 hours) at 12/02/2022 1354 Last data filed at 12/02/2022 1134 Gross per 24 hour  Intake 585.71 ml  Output 1900 ml  Net -1314.29 ml   Filed Weights   11/30/22 0519  12/01/22 0449 12/02/22 0455  Weight: 63.8 kg 63.9 kg 63.9 kg    Examination: General:  NAD HEENT: MM pink/moist; [place] in place Neuro: Aox3; MAE CV: s1s2, RRR, no m/r/g PULM:  dim clear BS bilaterally; [place] 2L GI: soft, bsx4 active  Extremities: warm/dry, no edema  Skin: no rashes or lesions    Resolved Hospital Problem list     Assessment & Plan:  Acute respiratory failure w/ hypoxia COPD w/ acute exacerbation RLL pneumonia Acute on chronic diastolic chf Tobacco abuse Plan: -CXR pending -lasix x1; further diuresis per primary; monitor UOP -cont azithro/rocephin; follow cultures and urine legionella -cont steroids -will start on dulera and incruse ellipta; cont scheduled duoneb for now  -pulmonary toiletry: IS and flutter -cont guaifenesin -pt/ot; oob as tolerated -smoking cessation counseling; cont nicotine patch -was supposed to follow up w/ pulmonary w/ [facility] in [place] in November but states that is a far drive. Will need to f/u w/ outpt [facility] in 1-2 weeks of discharge w/ available APP or MD  Best Practice (right click and "Reselect all SmartList Selections" daily)   Per primary  Labs   CBC: Recent Labs  Lab 11/28/22 1811 11/28/22 2035 11/29/22 0027 11/30/22 0257 12/01/22 0246 12/02/22 0414  WBC 9.0  --  8.3 6.4 7.5 7.7  HGB 8.8* 10.2* 8.4* 7.9* 9.0* 8.6*  HCT 30.6* 30.0* 28.3* 27.2* 31.0* 29.8*  MCV 77.1*  --  73.9* 75.3* 77.9* 76.4*  PLT 370  --  337 319 365 365    Basic Metabolic Panel: Recent Labs  Lab 11/28/22 1811 11/28/22 2035 11/29/22 0027 11/29/22 1905 11/30/22 0257 12/01/22 0246 12/02/22 0414  NA 131* 135 130*  --  135 131* 134*  K 3.6 3.7 4.2 3.8 4.7 4.7 4.8  CL 92*  --  96*  --  101 99 103  CO2 22  --  25  --  24 22 24   GLUCOSE 91  --  156*  --  147* 166* 118*  BUN 5*  --  <5*  --  11 13 17   CREATININE 0.73  --  0.76  --  0.66 0.85 0.98  CALCIUM 8.3*  --  8.1*  --  8.6* 8.4* 8.9  MG  --   --   --  1.4*  --   --   --     GFR: Estimated Creatinine Clearance: 49.4 mL/min (by C-G formula based on SCr of 0.98 mg/dL). Recent Labs  Lab 11/29/22 0027 11/30/22 0257 12/01/22 0246 12/02/22 0414  WBC 8.3 6.4 7.5 7.7    Liver Function Tests: Recent Labs  Lab 11/28/22 2011 11/29/22 0027 11/30/22 0257 12/01/22 0246 12/02/22 0414  AST 55* 50* 38 59* 50*  ALT 22 24 22 28 30   ALKPHOS 168* 191* 155* 166* 152*  BILITOT 0.7 0.8 1.1 0.8 1.0  PROT 7.5 8.3* 8.0 8.3* 8.5*  ALBUMIN 2.8* 3.0* 2.9* 3.1* 3.1*   No results for input(s): "LIPASE", "AMYLASE" in the last 168 hours. No results for input(s): "AMMONIA" in the last 168 hours.  ABG    Component Value Date/Time   HCO3 22.5 11/28/2022 2035   TCO2 24 11/28/2022 2035   ACIDBASEDEF 2.0 11/28/2022 2035   O2SAT 98 11/28/2022 2035     Coagulation Profile: Recent Labs  Lab 11/29/22 0027  INR 1.2    Cardiac Enzymes: No results for input(s): "CKTOTAL", "CKMB", "CKMBINDEX", "TROPONINI" in the last 168 hours.  HbA1C: Hgb A1c MFr Bld  Date/Time Value Ref Range Status  01/31/2019 06:43 PM 5.5 4.8 - 5.6 % Final    Comment:    (NOTE) Pre diabetes:          5.7%-6.4% Diabetes:              >6.4% Glycemic control for   <7.0% adults with diabetes     CBG: No results for input(s): "GLUCAP" in the last 168 hours.  Review of Systems:   Review of Systems  Constitutional:  Negative for fever.  Respiratory:  Positive for cough, shortness of breath and wheezing. Negative for sputum production.   Cardiovascular:  Negative for chest pain.  Gastrointestinal:  Negative for abdominal pain.     Past Medical History:  She,  has a past medical history of Acid reflux, Arthritis, COPD (chronic obstructive pulmonary disease) (HCC), Depression, ETOH abuse, Hepatitis, and Hypertension.   Surgical History:   Past Surgical History:  Procedure Laterality Date   CESAREAN SECTION     OPEN REDUCTION INTERNAL FIXATION (ORIF) DISTAL RADIAL FRACTURE Right 10/15/2017    Procedure: OPEN REDUCTION INTERNAL FIXATION (ORIF) DISTAL RADIAL FRACTURE;  Surgeon: Kennedy Bucker, MD;  Location: [facility] ORS;  Service: Orthopedics;  Laterality: Right;     Social History:   reports that she has been smoking cigarettes. She has a 45 pack-year smoking history. She has never used smokeless tobacco. She reports current alcohol use. She reports that she  does not currently use drugs after having used the following drugs: Opium, Benzodiazepines, and Heroin.   Family History:  Her family history includes CAD (age of onset: 71) in her father; CVA (age of onset: 56) in her sister; Ulcers in her mother. There is no history of Breast cancer.   Allergies No Known Allergies   Home Medications  Prior to Admission medications   Medication Sig Start Date End Date Taking? Authorizing Provider  albuterol (VENTOLIN HFA) 108 (90 Base) MCG/ACT inhaler Inhale 1 puff into the lungs every 6 (six) hours as needed for shortness of breath. 07/30/22 07/30/23 Yes [provider]  albuterol-ipratropium (COMBIVENT) 18-103 MCG/ACT inhaler Inhale 2 puffs into the lungs every 6 (six) hours as needed for wheezing or shortness of breath. 09/01/15  Yes Sharyn Creamer, MD  amLODipine (NORVASC) 5 MG tablet Take 1 tablet (5 mg total) by mouth daily. 02/01/19 11/28/22 Yes Pahwani, Daleen Bo, MD  diphenhydramine-acetaminophen (TYLENOL PM) 25-500 MG TABS tablet Take 1 tablet by mouth at bedtime.   Yes [provider]  metoprolol tartrate (LOPRESSOR) 25 MG tablet Take 0.5 tablets (12.5 mg total) by mouth 2 (two) times daily. 02/01/19 11/28/22 Yes Pahwani, Daleen Bo, MD  omeprazole (PRILOSEC OTC) 20 MG tablet Take 20 mg by mouth daily.   Yes [provider]  tiotropium (SPIRIVA) 18 MCG inhalation capsule Place 18 mcg into inhaler and inhale daily.   Yes [provider]  amLODipine (NORVASC) 5 MG tablet Take 1 tablet (5 mg total) by mouth daily. Patient not taking: Reported on 11/28/2022 02/01/19 03/03/19   Hughie Closs, MD  metoprolol tartrate (LOPRESSOR) 25 MG tablet Take 0.5 tablets (12.5 mg total) by mouth 2 (two) times daily. Patient not taking: Reported on 11/28/2022 02/01/19 03/03/19  Hughie Closs, MD          [facility] 12/02/2022, 1:54 PM  Please see [email or web address] for pager details.  From 7A-7P if no response, please call [phone]. After hours, please call ELink [phone].

## 2022-12-03 DIAGNOSIS — J9621 Acute and chronic respiratory failure with hypoxia: Secondary | ICD-10-CM

## 2022-12-03 DIAGNOSIS — J9601 Acute respiratory failure with hypoxia: Secondary | ICD-10-CM | POA: Diagnosis not present

## 2022-12-03 DIAGNOSIS — J441 Chronic obstructive pulmonary disease with (acute) exacerbation: Secondary | ICD-10-CM

## 2022-12-03 DIAGNOSIS — J810 Acute pulmonary edema: Secondary | ICD-10-CM

## 2022-12-03 LAB — COMPREHENSIVE METABOLIC PANEL
ALT: 49 U/L — ABNORMAL HIGH (ref 0–44)
AST: 88 U/L — ABNORMAL HIGH (ref 15–41)
Albumin: 3.3 g/dL — ABNORMAL LOW (ref 3.5–5.0)
Alkaline Phosphatase: 150 U/L — ABNORMAL HIGH (ref 38–126)
Anion gap: 6 (ref 5–15)
BUN: 19 mg/dL (ref 8–23)
CO2: 27 mmol/L (ref 22–32)
Calcium: 9.1 mg/dL (ref 8.9–10.3)
Chloride: 99 mmol/L (ref 98–111)
Creatinine, Ser: 0.87 mg/dL (ref 0.44–1.00)
GFR, Estimated: >60 mL/min (ref >60)
Glucose, Bld: 117 mg/dL — ABNORMAL HIGH (ref 70–99)
Potassium: 4.7 mmol/L (ref 3.5–5.1)
Sodium: 132 mmol/L — ABNORMAL LOW (ref 135–145)
Total Bilirubin: 0.7 mg/dL (ref 0.3–1.2)
Total Protein: 8.5 g/dL — ABNORMAL HIGH (ref 6.5–8.1)

## 2022-12-03 LAB — CBC
HCT: 30.8 % — ABNORMAL LOW (ref 36.0–46.0)
Hemoglobin: 9 g/dL — ABNORMAL LOW (ref 12.0–15.0)
MCH: 22.7 pg — ABNORMAL LOW (ref 26.0–34.0)
MCHC: 29.2 g/dL — ABNORMAL LOW (ref 30.0–36.0)
MCV: 77.8 fL — ABNORMAL LOW (ref 80.0–100.0)
Platelets: 420 10*3/uL — ABNORMAL HIGH (ref 150–400)
RBC: 3.96 MIL/uL (ref 3.87–5.11)
RDW: 22.4 % — ABNORMAL HIGH (ref 11.5–15.5)
WBC: 7.4 10*3/uL (ref 4.0–10.5)
nRBC: 0 % (ref 0.0–0.2)

## 2022-12-03 LAB — MAGNESIUM: Magnesium: 1.8 mg/dL (ref 1.7–2.4)

## 2022-12-03 MED ORDER — FUROSEMIDE 40 MG PO TABS
40.0000 mg | ORAL_TABLET | Freq: Every day | ORAL | Status: DC
  Administered 2022-12-03 – 2022-12-05 (×3): 40 mg via ORAL
  Filled 2022-12-03 (×3): qty 1

## 2022-12-03 MED ORDER — NICOTINE 14 MG/24HR TD PT24
14.0000 mg | MEDICATED_PATCH | Freq: Every day | TRANSDERMAL | Status: DC
  Administered 2022-12-04 – 2022-12-05 (×2): 14 mg via TRANSDERMAL
  Filled 2022-12-03 (×2): qty 1

## 2022-12-03 MED ORDER — VITAMIN B-12 1000 MCG PO TABS
1000.0000 ug | ORAL_TABLET | Freq: Every day | ORAL | Status: DC
  Administered 2022-12-03 – 2022-12-05 (×3): 1000 ug via ORAL
  Filled 2022-12-03 (×3): qty 1

## 2022-12-03 MED ORDER — AZITHROMYCIN 250 MG PO TABS
500.0000 mg | ORAL_TABLET | Freq: Once | ORAL | Status: AC
  Administered 2022-12-03: 500 mg via ORAL
  Filled 2022-12-03 (×2): qty 2

## 2022-12-03 MED ORDER — SPIRONOLACTONE 25 MG PO TABS
25.0000 mg | ORAL_TABLET | Freq: Every day | ORAL | Status: DC
  Administered 2022-12-03 – 2022-12-05 (×3): 25 mg via ORAL
  Filled 2022-12-03 (×3): qty 1

## 2022-12-03 MED ORDER — PREDNISONE 20 MG PO TABS
40.0000 mg | ORAL_TABLET | Freq: Every day | ORAL | Status: DC
  Administered 2022-12-03 – 2022-12-05 (×3): 40 mg via ORAL
  Filled 2022-12-03 (×3): qty 2

## 2022-12-03 NOTE — Plan of Care (Signed)
Patient without noted distress, no complaints voiced. Slept well. Will continue to monitor.   Problem: Education: Goal: Knowledge of disease or condition will improve Outcome: Progressing   Problem: Activity: Goal: Ability to tolerate increased activity will improve Outcome: Progressing   Problem: Respiratory: Goal: Ability to maintain a clear airway will improve Outcome: Progressing Goal: Levels of oxygenation will improve Outcome: Progressing Goal: Ability to maintain adequate ventilation will improve Outcome: Progressing   Problem: Education: Goal: Knowledge of General Education information will improve Description: Including pain rating scale, medication(s)/side effects and non-pharmacologic comfort measures Outcome: Progressing

## 2022-12-03 NOTE — Evaluation (Signed)
Physical Therapy Evaluation Patient Details Name: Kathryn Banks MRN: [id number] DOB: [birth year] Today's Date: 12/03/2022  History of Present Illness  Pt is a 67 y/o F presenting to ED on 8/30 with SOB, CXR showing emphysematious and chornic bronchiectatic change in lung. CTA negative for PE. Admitted for acute hypoxic respiratory failure. PMH includes COPD, tobacco abuse, acid reflux, Hep C, HTN, depression  Clinical Impression  Pt admitted with above diagnosis. Pt was able to ambulate without device with challenges to balance with Independence.  Pt required 2LO2 at rest and 4LO2 with activity to keep sats > 88%.  Will follow for endurance and energy conservation techniques.  Pt currently with functional limitations due to the deficits listed below (see PT Problem List). Pt will benefit from acute skilled PT to increase their independence and safety with mobility to allow discharge.           If plan is discharge home, recommend the following: Assistance with cooking/housework;Assist for transportation   Can travel by private vehicle        Equipment Recommendations Other (comment) (home O2)  Recommendations for Other Services       Functional Status Assessment Patient has had a recent decline in their functional status and demonstrates the ability to make significant improvements in function in a reasonable and predictable amount of time.     Precautions / Restrictions Precautions Precautions: None Restrictions Weight Bearing Restrictions: No      Mobility  Bed Mobility               General bed mobility comments: in chair    Transfers Overall transfer level: Modified independent Equipment used: None                    Ambulation/Gait Ambulation/Gait assistance: Supervision Gait Distance (Feet): 150 Feet Assistive device: None Gait Pattern/deviations: Step-through pattern, Decreased stride length   Gait velocity interpretation: <1.31 ft/sec, indicative of  household ambulator   General Gait Details: Pt ambulated without device and no LOB with challenges.  Needed 4LO2 with activity to keep sats >88%.  Needed 2LO2 at rest.  Stairs            Wheelchair Mobility     Tilt Bed    Modified Rankin (Stroke Patients Only)       Balance Overall balance assessment: No apparent balance deficits (not formally assessed)                                           Pertinent Vitals/Pain Pain Assessment Pain Assessment: No/denies pain    Home Living Family/patient expects to be discharged to:: Private residence Living Arrangements: Spouse/significant other Available Help at Discharge: Family;Available PRN/intermittently Type of Home: House Home Access: Stairs to enter Entrance Stairs-Rails: Right Entrance Stairs-Number of Steps: 2   Home Layout: One level Home Equipment: None      Prior Function Prior Level of Function : Independent/Modified Independent;Driving             Mobility Comments: no AD use ADLs Comments: ind, drives     Extremity/Trunk Assessment   Upper Extremity Assessment Upper Extremity Assessment: Defer to OT evaluation    Lower Extremity Assessment Lower Extremity Assessment: Overall WFL for tasks assessed    Cervical / Trunk Assessment Cervical / Trunk Assessment: Normal  Communication   Communication Communication: No apparent difficulties  Cognition Arousal: Alert  Behavior During Therapy: WFL for tasks assessed/performed Overall Cognitive Status: Within Functional Limits for tasks assessed                                          General Comments      Exercises Other Exercises Other Exercises: Gave pt Energy Conservation handout as well as breathing exercise handout.  Also reviewed incentive spirometer.   Assessment/Plan    PT Assessment Patient needs continued PT services  PT Problem List Decreased activity tolerance;Decreased balance;Decreased  mobility;Decreased knowledge of use of DME;Decreased safety awareness;Cardiopulmonary status limiting activity       PT Treatment Interventions DME instruction;Gait training;Stair training;Functional mobility training;Therapeutic activities;Therapeutic exercise;Patient/family education    PT Goals (Current goals can be found in the Care Plan section)  Acute Rehab PT Goals Patient Stated Goal: to go home PT Goal Formulation: With patient Time For Goal Achievement: 12/17/22 Potential to Achieve Goals: Fair    Frequency Min 1X/week     Co-evaluation               AM-PAC PT "6 Clicks" Mobility  Outcome Measure Help needed turning from your back to your side while in a flat bed without using bedrails?: None Help needed moving from lying on your back to sitting on the side of a flat bed without using bedrails?: None Help needed moving to and from a bed to a chair (including a wheelchair)?: None Help needed standing up from a chair using your arms (e.g., wheelchair or bedside chair)?: None Help needed to walk in hospital room?: A Little Help needed climbing 3-5 steps with a railing? : None 6 Click Score: 23    End of Session Equipment Utilized During Treatment: Gait belt;Oxygen Activity Tolerance: Patient limited by fatigue Patient left: in chair;with call bell/phone within reach;with chair alarm set Nurse Communication: Mobility status PT Visit Diagnosis: Muscle weakness (generalized) (M62.81);Other abnormalities of gait and mobility (R26.89)    Time: 8413-2440 PT Time Calculation (min) (ACUTE ONLY): 25 min   Charges:   PT Evaluation $PT Eval Moderate Complexity: 1 Mod PT Treatments $Gait Training: 8-22 mins PT General Charges $$ ACUTE PT VISIT: 1 Visit         Raekwan Spelman M,PT Acute Rehab Services [phone]   Bevelyn Buckles 12/03/2022, 1:07 PM

## 2022-12-03 NOTE — Progress Notes (Signed)
NAME:  Kathryn Banks, MRN:  [id number], DOB:  Aug 10, 1955, LOS: 5 ADMISSION DATE:  11/28/2022, CONSULTATION DATE:  9/3 REFERRING MD:  Dr. Isidoro Donning, CHIEF COMPLAINT:  CHF exacerbation   History of Present Illness:  Patient is a 67 yo F w/ pertinent pmh copd, tobacco abuse presents to [facility] ED on 8/30 w/ SOB.  Patient states sob has been progressively worsening over the past 24 hours. Denies fever/chills, chest pain. Came to [facility] on 8/30. Sats 79% on room air and improved to 92% on 2L [place]. BNP 355. CXR showing emphysematious and chronic bronchiectatic change in lung; no consolidation. CTA ruled out PE; RLL bronchopneumonia. Patient admitted to floors and treated for CAP w/ azithro/rocephin. Given duoneb and solumedrol. Echo showing LVEF 65-70%; G1 DD. Patient given lasix. RVP, urine strep negative; legionella pending.   On 9/3 patient with increasing o2 requirements up to 8L [place] when walking w/ nurse. Consulted PCCM.   Pertinent  Medical History   Past Medical History:  Diagnosis Date   Acid reflux    Arthritis    FINGERS   COPD (chronic obstructive pulmonary disease) (HCC)    Depression    ETOH abuse    Hepatitis    C-PT STATED SHE WAS TOLD THIS 2014   Hypertension    OFF MEDS FOR FEW YEARS     Significant Hospital Events: Including procedures, antibiotic start and stop dates in addition to other pertinent events   8/30 ARF w/ hypoxia 9/3 increasing o2 requirements; pccm consulted 12/03/2022 appears much improved  Interim History / Subjective:  -7 L over the last 5 days  Objective   Blood pressure (!) 150/94, pulse 79, temperature 98.1 F (36.7 C), temperature source Oral, resp. rate 20, height 5\' 4"  (1.626 m), weight 62.2 kg, last menstrual period 09/25/2005, SpO2 93%.    FiO2 (%):  [32 %] 32 %   Intake/Output Summary (Last 24 hours) at 12/03/2022 0857 Last data filed at 12/03/2022 0743 Gross per 24 hour  Intake 340 ml  Output 3300 ml  Net -2960 ml   Filed Weights   12/01/22 0449  12/02/22 0455 12/03/22 0507  Weight: 63.9 kg 63.9 kg 62.2 kg    Examination: Awake and alert sitting in a chair reports feeling much better today than yesterday No JVD or lymphadenopathy is appreciated Decreased breath sounds with some faint expiratory wheezes Heart sounds are regular regular rate and rhythm Abdomen soft nontender positive bowel sounds Extremities are warm dry without edema Chest x-ray reviewed appears to be wet  Resolved Hospital Problem list     Assessment & Plan:  Acute respiratory failure w/ hypoxia COPD w/ acute exacerbation RLL pneumonia Acute on chronic diastolic chf Tobacco abuse EtOH abuse Plan: Agree with diuresis currently she is -7 L over 5 days Chest x-ray appears to be wet possibility of fibrosis is noted Continue antimicrobial therapy Continue pulmonary toilet Use steroids Continue bronchodilators Follow-up with pulmonary as an outpatient Wean oxygen as tolerated for sats greater than 88% Smoking cessation Stop drinking     Best Practice (right click and "Reselect all SmartList Selections" daily)   Per primary  Labs   CBC: Recent Labs  Lab 11/29/22 0027 11/30/22 0257 12/01/22 0246 12/02/22 0414 12/03/22 0323  WBC 8.3 6.4 7.5 7.7 7.4  HGB 8.4* 7.9* 9.0* 8.6* 9.0*  HCT 28.3* 27.2* 31.0* 29.8* 30.8*  MCV 73.9* 75.3* 77.9* 76.4* 77.8*  PLT 337 319 365 365 420*    Basic Metabolic Panel: Recent Labs  Lab  11/29/22 0027 11/29/22 1905 11/30/22 0257 12/01/22 0246 12/02/22 0414 12/03/22 0323  NA 130*  --  135 131* 134* 132*  K 4.2 3.8 4.7 4.7 4.8 4.7  CL 96*  --  101 99 103 99  CO2 25  --  24 22 24 27   GLUCOSE 156*  --  147* 166* 118* 117*  BUN <5*  --  11 13 17 19   CREATININE 0.76  --  0.66 0.85 0.98 0.87  CALCIUM 8.1*  --  8.6* 8.4* 8.9 9.1  MG  --  1.4*  --   --   --  1.8   GFR: Estimated Creatinine Clearance: 55.7 mL/min (by C-G formula based on SCr of 0.87 mg/dL). Recent Labs  Lab 11/30/22 0257 12/01/22 0246  12/02/22 0414 12/03/22 0323  WBC 6.4 7.5 7.7 7.4    Liver Function Tests: Recent Labs  Lab 11/29/22 0027 11/30/22 0257 12/01/22 0246 12/02/22 0414 12/03/22 0323  AST 50* 38 59* 50* 88*  ALT 24 22 28 30  49*  ALKPHOS 191* 155* 166* 152* 150*  BILITOT 0.8 1.1 0.8 1.0 0.7  PROT 8.3* 8.0 8.3* 8.5* 8.5*  ALBUMIN 3.0* 2.9* 3.1* 3.1* 3.3*   No results for input(s): "LIPASE", "AMYLASE" in the last 168 hours. No results for input(s): "AMMONIA" in the last 168 hours.  ABG    Component Value Date/Time   PHART 7.37 12/02/2022 1355   PCO2ART 43 12/02/2022 1355   PO2ART 67 (L) 12/02/2022 1355   HCO3 24.9 12/02/2022 1355   TCO2 24 11/28/2022 2035   ACIDBASEDEF 0.6 12/02/2022 1355   O2SAT 93.3 12/02/2022 1355     Coagulation Profile: Recent Labs  Lab 11/29/22 0027  INR 1.2    Cardiac Enzymes: No results for input(s): "CKTOTAL", "CKMB", "CKMBINDEX", "TROPONINI" in the last 168 hours.  HbA1C: Hgb A1c MFr Bld  Date/Time Value Ref Range Status  01/31/2019 06:43 PM 5.5 4.8 - 5.6 % Final    Comment:    (NOTE) Pre diabetes:          5.7%-6.4% Diabetes:              >6.4% Glycemic control for   <7.0% adults with diabetes     CBG: No results for input(s): "GLUCAP" in the last 168 hours.           Brett Canales Kenzlee Fishburn ACNP Acute Care Nurse Practitioner [facility]/Critical [facility] Please consult Amion 12/03/2022, 8:57 AM

## 2022-12-03 NOTE — Progress Notes (Addendum)
[facility]                                                                              Kathryn Banks, is a 67 y.o. female, DOB - Aug 06, 1955, VEL:[id number] Admit date - 11/28/2022    Outpatient Primary MD for the patient is [name], MD  LOS - 5  days  Chief Complaint  Patient presents with   Shortness of Breath       Brief summary   66/F with COPD, long-term smoker, HTN, depression presented to ED with shortness of breath, progressively getting worse with cough and sputum production in the last 24 hours.  In ED, patient was noted to be hypoxic, O2 sats 79% on room air, improved to 92% on 2 L CT chest ruled out PE however showed right lower lobe bronchopneumonia, cirrhosis, aortic atherosclerosis. -Admitted, treated for COPD exacerbation with antibiotics steroids nebs etc. as well as diuretics for diastolic CHF -Also noted to have cirrhosis this admission, suspected to be EtOH related -9/3 PCCM consulted  Subjective: Patient seen and examined, Dr. Fransico Him notes reviewed, seen by PCCM yesterday, finally feels better today, on 3 L O2 this morning  Assessment & Plan   Acute hypoxic respiratory failure (HCC) secondary to COPD with acute exacerbation, right lower lung pneumonia -CTA chest ruled out PE however showed right lower lobe pneumonia. -Respiratory virus panel negative, blood cultures negative -Treated with IV antibiotics, steroids, DuoNebs -Continue Dulera, Xopenex PRN, Mucinex -Switch to prednisone today, DC antibiotics completed 5 days -Attempt to wean O2 as tolerated -Ambulatory O2 sats tomorrow, discharge planning  Acute on chronic diastolic CHF -2D echo showed EF of 65 to 70%, G1 DD, severely dilated right and left atrial size, normal right ventricular systolic function -Diuresed with IV Lasix as well as admission, she is 7.6 L negative, weight down 10 LB -Change to p.o. Lasix, add Aldactone, concern for cirrhosis as well -BMP in  a.m.  Microcytic anemia, folic acid deficiency, iron deficiency -Hemoglobin on presentation 8.8, low MCV,  -Anemia panel showed B12 273, folate very low 2.2 -Iron panel Fe 19, TIBC 470, percent saturation ratio 4, ferritin 20.  - colonoscopy once acute respiratory illness is resolved.  FOBT negative. -Started on folate, given IV iron X2 doses -B12 also low normal, add replacement  Nicotine abuse -Counseled strongly on smoking cessation Continue nicotine patch    Essential hypertension -Continue amlodipine, Lopressor    History of depression, anxiety -Continue Zoloft     Cirrhosis (HCC), history of alcohol use -CT chest reported cirrhosis, patient has a history of chronic alcohol use -Abdominal ultrasound showed nodular contour of the liver consistent with cirrhosis -LFTs showed AST > ALT, elevated alk phos.  PLT, INR normal - Counseled strongly on alcohol cessation    Hyperlipidemia -Hold statin until LFTs normalize   Estimated body mass index is 23.54 kg/m as calculated from the following:   Height as of this encounter: 5\' 4"  (1.626 m).   Weight as of this encounter: 62.2 kg.  Code Status: Full code DVT Prophylaxis: lovenov Family Communication: Updated patient Disposition Plan:     home tomorrow  Procedures:  2D echo  Consultants:   PCCM  Antimicrobials:   Anti-infectives (From admission, onward)    Start     Dose/Rate Route Frequency Ordered Stop   11/30/22 2200  azithromycin (ZITHROMAX) tablet 500 mg        500 mg Oral Daily 11/30/22 1259 12/04/22 0959   11/29/22 2200  cefTRIAXone (ROCEPHIN) 1 g in sodium chloride 0.9 % 100 mL IVPB        1 g 200 mL/hr over 30 Minutes Intravenous Every 24 hours 11/28/22 2115 12/06/22 2159   11/29/22 2200  azithromycin (ZITHROMAX) 500 mg in sodium chloride 0.9 % 250 mL IVPB  Status:  Discontinued        500 mg 250 mL/hr over 60 Minutes Intravenous Every 24 hours 11/28/22 2115 11/30/22 1259   11/28/22 2100  cefTRIAXone  (ROCEPHIN) 1 g in sodium chloride 0.9 % 100 mL IVPB        1 g 200 mL/hr over 30 Minutes Intravenous  Once 11/28/22 2047 11/28/22 2221   11/28/22 2100  azithromycin (ZITHROMAX) 500 mg in sodium chloride 0.9 % 250 mL IVPB        500 mg 250 mL/hr over 60 Minutes Intravenous  Once 11/28/22 2047 11/29/22 0128          Medications  amLODipine  5 mg Oral Daily   azithromycin  500 mg Oral Daily   enoxaparin (LOVENOX) injection  40 mg Subcutaneous Q24H   folic acid  1 mg Oral Daily   ipratropium-albuterol  3 mL Nebulization Q6H   metoprolol tartrate  12.5 mg Oral BID   mometasone-formoterol  2 puff Inhalation BID   [START ON 12/04/2022] nicotine  14 mg Transdermal Daily   pantoprazole  40 mg Oral Daily   predniSONE  40 mg Oral Q breakfast   sertraline  25 mg Oral QHS   sodium chloride flush  3 mL Intravenous Q12H   umeclidinium bromide  1 puff Inhalation Daily     Objective:   Vitals:   12/02/22 2350 12/03/22 0507 12/03/22 0724 12/03/22 0822  BP: (!) 136/92 (!) 147/94 (!) 150/94   Pulse: 75 74 72 79  Resp: 18 17 17 20   Temp: 98.4 F (36.9 C) 98.2 F (36.8 C) 98.1 F (36.7 C)   TempSrc: Oral Oral Oral   SpO2: 100% 98% 93%   Weight:  62.2 kg    Height:        Intake/Output Summary (Last 24 hours) at 12/03/2022 1113 Last data filed at 12/03/2022 0915 Gross per 24 hour  Intake 460 ml  Output 3300 ml  Net -2840 ml     Wt Readings from Last 3 Encounters:  12/03/22 62.2 kg  02/01/19 66.7 kg  10/15/17 66.2 kg   Physical Exam Pleasant thinly built female sitting up in bed, AAOx3, no distress HEENT: No JVD CVS: S1-S2, regular rhythm Lungs: Improving air movement, fine rales noted Abdomen: Soft, nontender, bowel sounds present  extremities: No edema And no rashes on exposed skin  Data Reviewed:  I have personally reviewed following labs    CBC Lab Results  Component Value Date   WBC 7.4 12/03/2022   RBC 3.96 12/03/2022   HGB 9.0 (L) 12/03/2022   HCT 30.8 (L)  12/03/2022   MCV 77.8 (L) 12/03/2022   MCH 22.7 (L) 12/03/2022   PLT 420 (H) 12/03/2022   MCHC 29.2 (L) 12/03/2022   RDW 22.4 (H) 12/03/2022   LYMPHSABS 3.1 04/22/2013   MONOABS 0.6 04/22/2013  EOSABS 0.1 04/22/2013   BASOSABS 0.1 04/22/2013     Last metabolic panel Lab Results  Component Value Date   NA 132 (L) 12/03/2022   K 4.7 12/03/2022   CL 99 12/03/2022   CO2 27 12/03/2022   BUN 19 12/03/2022   CREATININE 0.87 12/03/2022   GLUCOSE 117 (H) 12/03/2022   GFRNONAA >60 12/03/2022   GFRAA >60 01/31/2019   CALCIUM 9.1 12/03/2022   PROT 8.5 (H) 12/03/2022   ALBUMIN 3.3 (L) 12/03/2022   BILITOT 0.7 12/03/2022   ALKPHOS 150 (H) 12/03/2022   AST 88 (H) 12/03/2022   ALT 49 (H) 12/03/2022   ANIONGAP 6 12/03/2022    CBG (last 3)  No results for input(s): "GLUCAP" in the last 72 hours.    Coagulation Profile: Recent Labs  Lab 11/29/22 0027  INR 1.2     Radiology Studies: I have personally reviewed the imaging studies  DG Chest Port 1 View  Result Date: 12/02/2022 CLINICAL DATA:  Hypoxia EXAM: PORTABLE CHEST 1 VIEW COMPARISON:  11/28/2022 FINDINGS: Heart size and pulmonary vascularity are normal for technique. Emphysematous changes in the lungs with central interstitial markings. No focal consolidation is identified. No pleural effusions. No pneumothorax. Mediastinal contours appear intact. No pleural effusions. No pneumothorax. Mediastinal contours appear intact. Degenerative changes in the spine. IMPRESSION: Diffuse interstitial markings to the lungs may represent fibrosis or edema. Multifocal pneumonia could also be considered in the appropriate clinical setting. Electronically Signed   By: Burman Nieves M.D.   On: 12/02/2022 15:48       Zannie Cove M.D. [facility] 12/03/2022, 11:13 AM  Available via Epic secure chat 7am-7pm After 7 pm, please refer to night coverage provider listed on amion.

## 2022-12-03 NOTE — Progress Notes (Signed)
SATURATION QUALIFICATIONS: (This note is used to comply with regulatory documentation for home oxygen)  Patient Saturations on Room Air at Rest = 83%  Patient Saturations on Room Air while Ambulating = NT as desaturates on RA at rest  Patient Saturations on 4 Liters of oxygen while Ambulating = 88%  Please briefly explain why patient needs home oxygen:Requires O2 at rest and with activity to keep sats > 88%.  Clorissa Gruenberg M,PT Acute Rehab Services [phone]

## 2022-12-03 NOTE — Evaluation (Addendum)
Occupational Therapy Evaluation Patient Details Name: Kathryn Banks MRN: [id number] DOB: [birth year] Today's Date: 12/03/2022   History of Present Illness Pt is a 67 y/o F presenting to ED on 8/30 with SOB, CXR showing emphysematious and chornic bronchiectatic change in lung. CTA negative for PE. Admitted for acute hypoxic respiratory failure. PMH includes COPD, tobacco abuse, acid reflux, Hep C, HTN, depression   Clinical Impression   Pt reports ind at baseline with ADLs/functional mobility, lives with significant other who she reports can provide PRN assist but works frequently. Pt currently needing mod I - set up A for ADLs, and completes transfers and short distance ambulation with mod I. Pt on 4L O2 upon arrival, decr to 2L O2 during session and pt desatting to 88%, asymptomatic. Bumped to 3L and pt satting well, left pt on 2.5L O2 with pt satting 93%. Pt presenting with impairments listed below, will follow acutely. Anticipate no OT follow up needs at d/c.        If plan is discharge home, recommend the following: A little help with walking and/or transfers;A little help with bathing/dressing/bathroom;Assistance with cooking/housework    Functional Status Assessment  Patient has had a recent decline in their functional status and demonstrates the ability to make significant improvements in function in a reasonable and predictable amount of time.  Equipment Recommendations  None recommended by OT    Recommendations for Other Services PT consult     Precautions / Restrictions Precautions Precautions: None Restrictions Weight Bearing Restrictions: No      Mobility Bed Mobility               General bed mobility comments: EOB upon arrival, in chair at departure    Transfers Overall transfer level: Modified independent Equipment used: None                      Balance Overall balance assessment: No apparent balance deficits (not formally assessed)                                          ADL either performed or assessed with clinical judgement   ADL Overall ADL's : Needs assistance/impaired Eating/Feeding: Modified independent   Grooming: Modified independent   Upper Body Bathing: Set up   Lower Body Bathing: Set up   Upper Body Dressing : Set up   Lower Body Dressing: Set up   Toilet Transfer: Modified Independent   Toileting- Clothing Manipulation and Hygiene: Modified independent       Functional mobility during ADLs: Modified independent       Vision   Vision Assessment?: No apparent visual deficits     Perception Perception: Not tested       Praxis Praxis: Not tested       Pertinent Vitals/Pain Pain Assessment Pain Assessment: No/denies pain     Extremity/Trunk Assessment Upper Extremity Assessment Upper Extremity Assessment: Overall WFL for tasks assessed   Lower Extremity Assessment Lower Extremity Assessment: Defer to PT evaluation   Cervical / Trunk Assessment Cervical / Trunk Assessment: Normal   Communication Communication Communication: No apparent difficulties   Cognition Arousal: Alert Behavior During Therapy: WFL for tasks assessed/performed Overall Cognitive Status: Within Functional Limits for tasks assessed  General Comments  VSS on RA    Exercises     Shoulder Instructions      Home Living Family/patient expects to be discharged to:: Private residence Living Arrangements: Spouse/significant other Available Help at Discharge: Family;Available PRN/intermittently Type of Home: House Home Access: Stairs to enter [employer] of Steps: 2   Home Layout: One level     Bathroom Shower/Tub: Tub/shower unit         Home Equipment: None          Prior Functioning/Environment Prior Level of Function : Independent/Modified Independent;Driving             Mobility Comments: no AD use ADLs  Comments: ind, drives        OT Problem List: Decreased activity tolerance;Cardiopulmonary status limiting activity      OT Treatment/Interventions: Self-care/ADL training;Therapeutic exercise;DME and/or AE instruction;Energy conservation;Therapeutic activities;Patient/family education;Balance training    OT Goals(Current goals can be found in the care plan section) Acute Rehab OT Goals Patient Stated Goal: none stated OT Goal Formulation: With patient Time For Goal Achievement: 12/17/22 Potential to Achieve Goals: Good  OT Frequency: Min 1X/week    Co-evaluation              AM-PAC OT "6 Clicks" Daily Activity     Outcome Measure Help from another person eating meals?: None Help from another person taking care of personal grooming?: A Little Help from another person toileting, which includes using toliet, bedpan, or urinal?: A Little Help from another person bathing (including washing, rinsing, drying)?: A Little Help from another person to put on and taking off regular upper body clothing?: A Little Help from another person to put on and taking off regular lower body clothing?: A Little 6 Click Score: 19   End of Session Nurse Communication: Mobility status; left on 2.5L O2  Activity Tolerance: Patient tolerated treatment well Patient left: in chair;with call bell/phone within reach;with chair alarm set  OT Visit Diagnosis: Muscle weakness (generalized) (M62.81)                Time: 0981-1914 OT Time Calculation (min): 24 min Charges:  OT General Charges $OT Visit: 1 Visit OT Evaluation $OT Eval Moderate Complexity: 1 Mod OT Treatments $Self Care/Home Management : 8-22 mins  Carver Fila, OTD, OTR/L SecureChat Preferred Acute Rehab [phone]   Arlenne Kimbley K Koonce 12/03/2022, 8:40 AM

## 2022-12-03 NOTE — TOC Progression Note (Signed)
Transition of Care [facility]) - Progression Note    Patient Details  Name: Kathryn Banks MRN: [id number] Date of Birth: 12/25/1955  [facility] of [name]) CM/SW Contact  Leone Haven, RN Phone Number: 12/03/2022, 7:04 PM  Clinical Narrative:    Patient will need home oxygen, she is ok with [employer] supplyin the oxygen, NCM made referral to [facility] with [employer], awaiting order from doctor.     Expected Discharge Plan: Home/Self Care Barriers to Discharge: No Barriers Identified  Expected Discharge Plan and Services In-house Referral: NA   Post Acute Care Choice: NA Living arrangements for the past 2 months: Single Family Home                 DME Arranged: N/A DME Agency: NA       HH Arranged: NA           Social Determinants of Health (SDOH) Interventions SDOH Screenings   Food Insecurity: No Food Insecurity (11/28/2022)  Housing: Low Risk  (11/28/2022)  Transportation Needs: No Transportation Needs (11/28/2022)  Utilities: Not At Risk (11/28/2022)  Tobacco Use: High Risk (12/02/2022)    Readmission Risk Interventions     No data to display

## 2022-12-04 DIAGNOSIS — J9601 Acute respiratory failure with hypoxia: Secondary | ICD-10-CM | POA: Diagnosis not present

## 2022-12-04 LAB — BASIC METABOLIC PANEL
Anion gap: 10 (ref 5–15)
BUN: 22 mg/dL (ref 8–23)
CO2: 25 mmol/L (ref 22–32)
Calcium: 8.9 mg/dL (ref 8.9–10.3)
Chloride: 99 mmol/L (ref 98–111)
Creatinine, Ser: 0.95 mg/dL (ref 0.44–1.00)
GFR, Estimated: >60 mL/min (ref >60)
Glucose, Bld: 94 mg/dL (ref 70–99)
Potassium: 3.9 mmol/L (ref 3.5–5.1)
Sodium: 134 mmol/L — ABNORMAL LOW (ref 135–145)

## 2022-12-04 LAB — CULTURE, BLOOD (ROUTINE X 2)
Culture: NO GROWTH
Culture: NO GROWTH

## 2022-12-04 MED ORDER — BISACODYL 5 MG PO TBEC
5.0000 mg | DELAYED_RELEASE_TABLET | Freq: Every day | ORAL | Status: DC | PRN
  Administered 2022-12-04: 5 mg via ORAL
  Filled 2022-12-04: qty 1

## 2022-12-04 MED ORDER — INFLUENZA VAC A&B SURF ANT ADJ 0.5 ML IM SUSY
0.5000 mL | PREFILLED_SYRINGE | INTRAMUSCULAR | Status: AC
  Administered 2022-12-05: 0.5 mL via INTRAMUSCULAR
  Filled 2022-12-04: qty 0.5

## 2022-12-04 MED ORDER — IPRATROPIUM-ALBUTEROL 0.5-2.5 (3) MG/3ML IN SOLN
3.0000 mL | Freq: Two times a day (BID) | RESPIRATORY_TRACT | Status: DC
  Administered 2022-12-04 – 2022-12-05 (×2): 3 mL via RESPIRATORY_TRACT
  Filled 2022-12-04 (×2): qty 3

## 2022-12-04 NOTE — Progress Notes (Signed)
Mobility Specialist Progress Note:    12/04/22 1602  Mobility  Activity Ambulated with assistance in hallway  Level of Assistance Standby assist, set-up cues, supervision of patient - no hands on  Assistive Device None  Distance Ambulated (ft) 350 ft  Activity Response Tolerated well  Mobility Referral Yes  $Mobility charge 1 Mobility  Mobility Specialist Start Time (ACUTE ONLY) 1500  Mobility Specialist Stop Time (ACUTE ONLY) 1515  Mobility Specialist Time Calculation (min) (ACUTE ONLY) 15 min   Received pt in bed having no complaints and agreeable to mobility. Attempted to ambulate on 3L/min SPO2 was 85%. O2 flow increased to 4L/min SPO2 93%. Returned to room w/o fault. Left in bed w/ call bell in reach and all needs met.   Thompson Grayer Mobility Specialist  Please contact vis Secure Chat or  Rehab Office [phone]

## 2022-12-04 NOTE — Plan of Care (Signed)
°  Problem: Education: Goal: Knowledge of disease or condition will improve Outcome: Progressing   Problem: Activity: Goal: Ability to tolerate increased activity will improve Outcome: Progressing Goal: Will verbalize the importance of balancing activity with adequate rest periods Outcome: Progressing   Problem: Respiratory: Goal: Ability to maintain a clear airway will improve Outcome: Progressing Goal: Levels of oxygenation will improve Outcome: Progressing

## 2022-12-04 NOTE — Progress Notes (Signed)
PROGRESS NOTE    Raneshia Estell  WFU:[id number] DOB: January 17, 1956 DOA: 11/28/2022 PCP: [name], MD   Brief Narrative:  This 67 yrs old Female with COPD, long-term smoker, HTN, depression presented to ED with shortness of breath, progressively getting worse with cough and sputum production in the last 24 hours.  In ED, patient was noted to be hypoxic, O2 sats 79% on room air, improved to 92% on 2 L. CT chest ruled out PE however showed right lower lobe bronchopneumonia, cirrhosis, aortic atherosclerosis. -Admitted, treated for COPD exacerbation with antibiotics,  steroids nebs etc. as well as diuretics for diastolic CHF -Also noted to have cirrhosis this admission, suspected to be EtOH related. -9/3 PCCM consulted.   Continue Prednisone, antibiotics  and may require home oxygen.   Assessment & Plan:   Principal Problem:   Acute hypoxic respiratory failure (HCC) Active Problems:   COPD with acute exacerbation (HCC)   Right lower lobe pneumonia   Essential hypertension   Continuous dependence on cigarette smoking   History of depression   Cirrhosis (HCC)   GAD (generalized anxiety disorder)   Microcytic anemia   Hyperlipidemia   Elevated brain natriuretic peptide (BNP) level   Acute diastolic CHF (congestive heart failure) (HCC)   Community acquired pneumonia of right lower lobe of lung   Acute pulmonary edema (HCC)   Acute exacerbation of chronic obstructive pulmonary disease (COPD) (HCC)   Acute on chronic respiratory failure with hypoxia (HCC)   Acute hypoxic respiratory failure: Multifactorial: COPD with acute exacerbation, right lower lung pneumonia: -CTA chest ruled out PE however showed right lower lobe pneumonia. -Respiratory virus panel negative, blood cultures negative. -Treated with IV antibiotics, steroids, DuoNebs. -Continue Dulera, Xopenex PRN, Mucinex -Switched to prednisone 9/4, DC antibiotics,  completed 5 days 9/5 -Attempt to wean O2 as  tolerated. -Ambulatory O2 saturation completed.  Patient qualified for home oxygen.    Acute on chronic diastolic CHF: -2D echo showed EF of 65 to 70%, G1 DD, severely dilated right and left atrial size, normal right ventricular systolic function. -Diuresed with IV Lasix as well as admission, She is 7.6 L negative, weight down 10 LB. -Change to p.o. Lasix, add Aldactone, concern for cirrhosis as well.   Microcytic anemia, folic acid deficiency, iron deficiency -Hemoglobin on presentation 8.8, low MCV,  -Anemia panel showed B12 > 273, folate very low 2.2 -Iron panel Fe 19, TIBC 470, percent saturation ratio 4, ferritin 20.  - Colonoscopy once acute respiratory illness is resolved.  FOBT negative. -Started on folate, given IV iron X 2 doses -B12 also low normal,  Continue replacement   Nicotine abuse: -Counseled strongly on smoking cessation. -Continue nicotine patch.   Essential hypertension -Continue amlodipine, Lopressor.   History of depression, anxiety -Continue Zoloft .   Cirrhosis (HCC), history of alcohol use -CT chest reported cirrhosis, patient has a history of chronic alcohol use. -Abdominal ultrasound showed nodular contour of the liver consistent with cirrhosis -LFTs showed AST > ALT, elevated alk phos.  PLT, INR normal - Counseled strongly on alcohol cessation   Hyperlipidemia -Hold statin until LFTs normalize     Estimated body mass index is 23.54 kg/m as calculated from the following:   Height as of this encounter: 5\' 4"  (1.626 m).   Weight as of this encounter: 62.2 kg.     DVT prophylaxis: Lovenox Code Status:  Full code Family Communication: No family at bed side. Disposition Plan:    Status is: Inpatient Remains inpatient appropriate because:  Admitted for COPD exacerbation and pneumonia.   Consultants:  Pulmonology  Procedures: None  Antimicrobials: Anti-infectives (From admission, onward)    Start     Dose/Rate Route Frequency Ordered  Stop   12/03/22 1315  azithromycin (ZITHROMAX) tablet 500 mg        500 mg Oral  Once 12/03/22 1222 12/03/22 1236   11/30/22 2200  azithromycin (ZITHROMAX) tablet 500 mg        500 mg Oral Daily 11/30/22 1259 12/04/22 0959   11/29/22 2200  cefTRIAXone (ROCEPHIN) 1 g in sodium chloride 0.9 % 100 mL IVPB  Status:  Discontinued        1 g 200 mL/hr over 30 Minutes Intravenous Every 24 hours 11/28/22 2115 12/03/22 1124   11/29/22 2200  azithromycin (ZITHROMAX) 500 mg in sodium chloride 0.9 % 250 mL IVPB  Status:  Discontinued        500 mg 250 mL/hr over 60 Minutes Intravenous Every 24 hours 11/28/22 2115 11/30/22 1259   11/28/22 2100  cefTRIAXone (ROCEPHIN) 1 g in sodium chloride 0.9 % 100 mL IVPB        1 g 200 mL/hr over 30 Minutes Intravenous  Once 11/28/22 2047 11/28/22 2221   11/28/22 2100  azithromycin (ZITHROMAX) 500 mg in sodium chloride 0.9 % 250 mL IVPB        500 mg 250 mL/hr over 60 Minutes Intravenous  Once 11/28/22 2047 11/29/22 0128      Subjective: Patient was seen and examined at bedside.  Overnight events noted.   Patient reports doing much better.  She still remains on 5 L of supplemental oxygen.   Objective: Vitals:   12/04/22 0909 12/04/22 1015 12/04/22 1200 12/04/22 1208  BP:  137/81 (!) 129/90 (!) 142/78  Pulse:  77 66 69  Resp:   19 19  Temp:   98.4 F (36.9 C) 98.1 F (36.7 C)  TempSrc:    Oral  SpO2: 96%  90% 91%  Weight:      Height:        Intake/Output Summary (Last 24 hours) at 12/04/2022 1405 Last data filed at 12/04/2022 1252 Gross per 24 hour  Intake 898 ml  Output 3400 ml  Net -2502 ml   Filed Weights   12/02/22 0455 12/03/22 0507 12/04/22 0458  Weight: 63.9 kg 62.2 kg 62.1 kg    Examination:  General exam: Appears calm and comfortable, deconditioned, not in any acute distress. Respiratory system: Clear to auscultation. Respiratory effort normal.  RR 16 Cardiovascular system: S1 & S2 heard, RRR.  No murmur, no pedal  edema. Gastrointestinal system: Abdomen is non distended, soft and non tender.Normal bowel sounds heard. Central nervous system: Alert and oriented x 3. No focal neurological deficits. Extremities: Symmetric 5 x 5 power. Skin: No rashes, lesions or ulcers Psychiatry: Judgement and insight appear normal. Mood & affect appropriate.     Data Reviewed: I have personally reviewed following labs and imaging studies  CBC: Recent Labs  Lab 11/29/22 0027 11/30/22 0257 12/01/22 0246 12/02/22 0414 12/03/22 0323  WBC 8.3 6.4 7.5 7.7 7.4  HGB 8.4* 7.9* 9.0* 8.6* 9.0*  HCT 28.3* 27.2* 31.0* 29.8* 30.8*  MCV 73.9* 75.3* 77.9* 76.4* 77.8*  PLT 337 319 365 365 420*   Basic Metabolic Panel: Recent Labs  Lab 11/29/22 1905 11/30/22 0257 12/01/22 0246 12/02/22 0414 12/03/22 0323 12/04/22 0300  NA  --  135 131* 134* 132* 134*  K 3.8 4.7 4.7 4.8 4.7 3.9  CL  --  101 99 103 99 99  CO2  --  24 22 24 27 25   GLUCOSE  --  147* 166* 118* 117* 94  BUN  --  11 13 17 19 22   CREATININE  --  0.66 0.85 0.98 0.87 0.95  CALCIUM  --  8.6* 8.4* 8.9 9.1 8.9  MG 1.4*  --   --   --  1.8  --    GFR: Estimated Creatinine Clearance: 51 mL/min (by C-G formula based on SCr of 0.95 mg/dL). Liver Function Tests: Recent Labs  Lab 11/29/22 0027 11/30/22 0257 12/01/22 0246 12/02/22 0414 12/03/22 0323  AST 50* 38 59* 50* 88*  ALT 24 22 28 30  49*  ALKPHOS 191* 155* 166* 152* 150*  BILITOT 0.8 1.1 0.8 1.0 0.7  PROT 8.3* 8.0 8.3* 8.5* 8.5*  ALBUMIN 3.0* 2.9* 3.1* 3.1* 3.3*   No results for input(s): "LIPASE", "AMYLASE" in the last 168 hours. No results for input(s): "AMMONIA" in the last 168 hours. Coagulation Profile: Recent Labs  Lab 11/29/22 0027  INR 1.2   Cardiac Enzymes: No results for input(s): "CKTOTAL", "CKMB", "CKMBINDEX", "TROPONINI" in the last 168 hours. BNP (last 3 results) No results for input(s): "PROBNP" in the last 8760 hours. HbA1C: No results for input(s): "HGBA1C" in the last  72 hours. CBG: No results for input(s): "GLUCAP" in the last 168 hours. Lipid Profile: No results for input(s): "CHOL", "HDL", "LDLCALC", "TRIG", "CHOLHDL", "LDLDIRECT" in the last 72 hours. Thyroid Function Tests: No results for input(s): "TSH", "T4TOTAL", "FREET4", "T3FREE", "THYROIDAB" in the last 72 hours. Anemia Panel: No results for input(s): "VITAMINB12", "FOLATE", "FERRITIN", "TIBC", "IRON", "RETICCTPCT" in the last 72 hours. Sepsis Labs: No results for input(s): "PROCALCITON", "LATICACIDVEN" in the last 168 hours.  Recent Results (from the past 240 hour(s))  MRSA Next Gen by PCR, Nasal     Status: None   Collection Time: 11/29/22 12:20 AM   Specimen: Nasal Mucosa; Nasal Swab  Result Value Ref Range Status   MRSA by PCR Next Gen NOT DETECTED NOT DETECTED Final    Comment: (NOTE) The GeneXpert MRSA Assay (FDA approved for NASAL specimens only), is one component of a comprehensive MRSA colonization surveillance program. It is not intended to diagnose MRSA infection nor to guide or monitor treatment for MRSA infections. Test performance is not FDA approved in patients less than 73 years old. Performed at [facility], [address] [place], [place] [address]   Culture, blood (Routine X 2) w Reflex to ID Panel     Status: None   Collection Time: 11/29/22 12:25 AM   Specimen: BLOOD RIGHT ARM  Result Value Ref Range Status   Specimen Description BLOOD RIGHT ARM  Final   Special Requests   Final    BOTTLES DRAWN AEROBIC AND ANAEROBIC Blood Culture results may not be optimal due to an excessive volume of blood received in culture bottles   Culture   Final    NO GROWTH 5 DAYS Performed at [facility], [address] [place], [place] [address]    Report Status 12/04/2022 FINAL  Final  Culture, blood (Routine X 2) w Reflex to ID Panel     Status: None   Collection Time: 11/29/22 12:27 AM   Specimen: BLOOD LEFT ARM  Result Value Ref Range Status   Specimen  Description BLOOD LEFT ARM  Final   Special Requests   Final    BOTTLES DRAWN AEROBIC AND ANAEROBIC Blood Culture results may not  be optimal due to an excessive volume of blood received in culture bottles   Culture   Final    NO GROWTH 5 DAYS Performed at [facility], [address], [place], [place] [address]    Report Status 12/04/2022 FINAL  Final  Respiratory (~20 pathogens) panel by PCR     Status: None   Collection Time: 11/29/22  3:28 AM   Specimen: Nasopharyngeal Swab; Respiratory  Result Value Ref Range Status   Adenovirus NOT DETECTED NOT DETECTED Final   Coronavirus 229E NOT DETECTED NOT DETECTED Final    Comment: (NOTE) The Coronavirus on the Respiratory Panel, DOES NOT test for the novel  Coronavirus (2019 nCoV)    Coronavirus HKU1 NOT DETECTED NOT DETECTED Final   Coronavirus NL63 NOT DETECTED NOT DETECTED Final   Coronavirus OC43 NOT DETECTED NOT DETECTED Final   Metapneumovirus NOT DETECTED NOT DETECTED Final   Rhinovirus / Enterovirus NOT DETECTED NOT DETECTED Final   Influenza A NOT DETECTED NOT DETECTED Final   Influenza B NOT DETECTED NOT DETECTED Final   Parainfluenza Virus 1 NOT DETECTED NOT DETECTED Final   Parainfluenza Virus 2 NOT DETECTED NOT DETECTED Final   Parainfluenza Virus 3 NOT DETECTED NOT DETECTED Final   Parainfluenza Virus 4 NOT DETECTED NOT DETECTED Final   Respiratory Syncytial Virus NOT DETECTED NOT DETECTED Final   Bordetella pertussis NOT DETECTED NOT DETECTED Final   Bordetella Parapertussis NOT DETECTED NOT DETECTED Final   Chlamydophila pneumoniae NOT DETECTED NOT DETECTED Final   Mycoplasma pneumoniae NOT DETECTED NOT DETECTED Final    Comment: Performed at [facility], [address] [place], [place] [address]  Expectorated Sputum Assessment w Gram Stain, Rflx to Resp Cult     Status: None   Collection Time: 11/29/22  3:28 AM   Specimen: Sputum  Result Value Ref Range Status   Specimen Description SPUTUM  Final    Special Requests NONE  Final   Sputum evaluation   Final    THIS SPECIMEN IS ACCEPTABLE FOR SPUTUM CULTURE Performed at [facility], [address] [place], [place] [address]    Report Status 11/29/2022 FINAL  Final  Culture, Respiratory w Gram Stain     Status: None   Collection Time: 11/29/22  3:28 AM   Specimen: SPU  Result Value Ref Range Status   Specimen Description SPUTUM  Final   Special Requests NONE Reflexed from [id number]  Final   Gram Stain   Final    MODERATE WBC PRESENT,BOTH PMN AND MONONUCLEAR FEW SQUAMOUS EPITHELIAL CELLS PRESENT FEW GRAM NEGATIVE RODS RARE GRAM POSITIVE COCCI IN PAIRS    Culture   Final    FEW Normal respiratory flora-no Staph aureus or Pseudomonas seen Performed at [facility], [address], [place], [place] [address]    Report Status 12/01/2022 FINAL  Final  Respiratory (~20 pathogens) panel by PCR     Status: None   Collection Time: 12/02/22  6:00 PM   Specimen: Nasopharyngeal Swab; Respiratory  Result Value Ref Range Status   Adenovirus NOT DETECTED NOT DETECTED Final   Coronavirus 229E NOT DETECTED NOT DETECTED Final    Comment: (NOTE) The Coronavirus on the Respiratory Panel, DOES NOT test for the novel  Coronavirus (2019 nCoV)    Coronavirus HKU1 NOT DETECTED NOT DETECTED Final   Coronavirus NL63 NOT DETECTED NOT DETECTED Final   Coronavirus OC43 NOT DETECTED NOT DETECTED Final   Metapneumovirus NOT DETECTED NOT DETECTED  Final   Rhinovirus / Enterovirus NOT DETECTED NOT DETECTED Final   Influenza A NOT DETECTED NOT DETECTED Final   Influenza B NOT DETECTED NOT DETECTED Final   Parainfluenza Virus 1 NOT DETECTED NOT DETECTED Final   Parainfluenza Virus 2 NOT DETECTED NOT DETECTED Final   Parainfluenza Virus 3 NOT DETECTED NOT DETECTED Final   Parainfluenza Virus 4 NOT DETECTED NOT DETECTED Final   Respiratory Syncytial Virus NOT DETECTED NOT DETECTED Final   Bordetella pertussis NOT DETECTED NOT DETECTED Final    Bordetella Parapertussis NOT DETECTED NOT DETECTED Final   Chlamydophila pneumoniae NOT DETECTED NOT DETECTED Final   Mycoplasma pneumoniae NOT DETECTED NOT DETECTED Final    Comment: Performed at [facility], [address], [place], [place] [address]    Radiology Studies: DG Chest Port 1 View  Result Date: 12/02/2022 CLINICAL DATA:  Hypoxia EXAM: PORTABLE CHEST 1 VIEW COMPARISON:  11/28/2022 FINDINGS: Heart size and pulmonary vascularity are normal for technique. Emphysematous changes in the lungs with central interstitial markings. No focal consolidation is identified. No pleural effusions. No pneumothorax. Mediastinal contours appear intact. No pleural effusions. No pneumothorax. Mediastinal contours appear intact. Degenerative changes in the spine. IMPRESSION: Diffuse interstitial markings to the lungs may represent fibrosis or edema. Multifocal pneumonia could also be considered in the appropriate clinical setting. Electronically Signed   By: Burman Nieves M.D.   On: 12/02/2022 15:48     Scheduled Meds:  amLODipine  5 mg Oral Daily   vitamin B-12  1,000 mcg Oral Daily   enoxaparin (LOVENOX) injection  40 mg Subcutaneous Q24H   folic acid  1 mg Oral Daily   furosemide  40 mg Oral Daily   [START ON 12/05/2022] influenza vaccine adjuvanted  0.5 mL Intramuscular Tomorrow-1000   ipratropium-albuterol  3 mL Nebulization BID   metoprolol tartrate  12.5 mg Oral BID   mometasone-formoterol  2 puff Inhalation BID   nicotine  14 mg Transdermal Daily   pantoprazole  40 mg Oral Daily   predniSONE  40 mg Oral Q breakfast   sertraline  25 mg Oral QHS   sodium chloride flush  3 mL Intravenous Q12H   spironolactone  25 mg Oral Daily   umeclidinium bromide  1 puff Inhalation Daily   Continuous Infusions:  sodium chloride       LOS: 6 days    Time spent: 50 mins    Willeen Niece, MD [facility]   If 7PM-7AM, please contact night-coverage

## 2022-12-04 NOTE — Plan of Care (Signed)
  Problem: Education: Goal: Knowledge of the prescribed therapeutic regimen will improve Outcome: Progressing   Problem: Activity: Goal: Ability to tolerate increased activity will improve Outcome: Progressing   

## 2022-12-04 NOTE — TOC Progression Note (Signed)
Transition of Care [facility]) - Progression Note    Patient Details  Name: Kathryn Banks MRN: [id number] Date of Birth: 09/15/1955  Transition of Care [facility]) CM/SW Contact  Lockie Pares, RN Phone Number: 12/04/2022, 3:03 PM  Clinical Narrative:    Orders in for oxygen, however  had a increase in oxygen need, on 5 LPM currently, PCCM is seeing her Bronchopneumonia.  Will need to call for oxygen when closer to [place].   Expected Discharge Plan: Home/Self Care Barriers to Discharge: No Barriers Identified  Expected Discharge Plan and Services In-house Referral: NA   Post Acute Care Choice: NA Living arrangements for the past 2 months: Single Family Home                 DME Arranged: N/A DME Agency: NA       HH Arranged: NA           Social Determinants of Health (SDOH) Interventions SDOH Screenings   Food Insecurity: No Food Insecurity (11/28/2022)  Housing: Low Risk  (11/28/2022)  Transportation Needs: No Transportation Needs (11/28/2022)  Utilities: Not At Risk (11/28/2022)  Tobacco Use: High Risk (12/02/2022)    Readmission Risk Interventions     No data to display

## 2022-12-04 NOTE — Progress Notes (Signed)
NAME:  Kathryn Banks, MRN:  [id number], DOB:  [birth year], LOS: 6 ADMISSION DATE:  11/28/2022, CONSULTATION DATE:  9/3 REFERRING MD:  Dr. Isidoro Donning, CHIEF COMPLAINT:  CHF exacerbation   History of Present Illness:  Patient is a 67 yo F w/ pertinent pmh copd, tobacco abuse presents to [facility] ED on 8/30 w/ SOB.  Patient states sob has been progressively worsening over the past 24 hours. Denies fever/chills, chest pain. Came to [facility] on 8/30. Sats 79% on room air and improved to 92% on 2L [place]. BNP 355. CXR showing emphysematious and chronic bronchiectatic change in lung; no consolidation. CTA ruled out PE; RLL bronchopneumonia. Patient admitted to floors and treated for CAP w/ azithro/rocephin. Given duoneb and solumedrol. Echo showing LVEF 65-70%; G1 DD. Patient given lasix. RVP, urine strep negative; legionella pending.   On 9/3 patient with increasing o2 requirements up to 8L [place] when walking w/ nurse. Consulted PCCM.   Pertinent  Medical History   Past Medical History:  Diagnosis Date   Acid reflux    Arthritis    FINGERS   COPD (chronic obstructive pulmonary disease) (HCC)    Depression    ETOH abuse    Hepatitis    C-PT STATED SHE WAS TOLD THIS 2014   Hypertension    OFF MEDS FOR FEW YEARS     Significant Hospital Events: Including procedures, antibiotic start and stop dates in addition to other pertinent events   8/30 ARF w/ hypoxia 9/3 increasing o2 requirements; pccm consulted 12/03/2022 appears much improved 12/04/2022 O2 requirements have gone up to 4 L nasal cannula  Interim History / Subjective:  - may be home O2 dependent  Objective   Blood pressure 137/81, pulse 77, temperature 98.4 F (36.9 C), temperature source Oral, resp. rate 19, height 5\' 4"  (1.626 m), weight 62.1 kg, last menstrual period 09/25/2005, SpO2 96%.        Intake/Output Summary (Last 24 hours) at 12/04/2022 1039 Last data filed at 12/04/2022 0500 Gross per 24 hour  Intake 240 ml  Output 2400 ml  Net -2160 ml    Filed Weights   12/02/22 0455 12/03/22 0507 12/04/22 0458  Weight: 63.9 kg 62.2 kg 62.1 kg    Examination: Awake and alert no acute distress. Currently on 4 L nasal cannula which was increased during the night from 3 due to sats of 90% Decreased breath sounds throughout Heart sounds are regular Abdomen soft nontender Extremities without edema  Resolved Hospital Problem list     Assessment & Plan:  Acute respiratory failure w/ hypoxia COPD w/ acute exacerbation RLL pneumonia Acute on chronic diastolic chf Tobacco abuse EtOH abuse Plan: Agree with diuresis May be home O2 dependent currently on 4 L nasal cannula sats of 91% Continue antimicrobial therapy Currently on prednisone 40 mg daily can transition to off over 10 days Pulmonary toilet Nicotine patches for nicotine withdrawal Anxiolytics Smoking cessation           Best Practice (right click and "Reselect all SmartList Selections" daily)   Per primary  Labs   CBC: Recent Labs  Lab 11/29/22 0027 11/30/22 0257 12/01/22 0246 12/02/22 0414 12/03/22 0323  WBC 8.3 6.4 7.5 7.7 7.4  HGB 8.4* 7.9* 9.0* 8.6* 9.0*  HCT 28.3* 27.2* 31.0* 29.8* 30.8*  MCV 73.9* 75.3* 77.9* 76.4* 77.8*  PLT 337 319 365 365 420*    Basic Metabolic Panel: Recent Labs  Lab 11/29/22 1905 11/30/22 0257 12/01/22 0246 12/02/22 0414 12/03/22 0323 12/04/22 0300  NA  --  135 131* 134* 132* 134*  K 3.8 4.7 4.7 4.8 4.7 3.9  CL  --  101 99 103 99 99  CO2  --  24 22 24 27 25   GLUCOSE  --  147* 166* 118* 117* 94  BUN  --  11 13 17 19 22   CREATININE  --  0.66 0.85 0.98 0.87 0.95  CALCIUM  --  8.6* 8.4* 8.9 9.1 8.9  MG 1.4*  --   --   --  1.8  --    GFR: Estimated Creatinine Clearance: 51 mL/min (by C-G formula based on SCr of 0.95 mg/dL). Recent Labs  Lab 11/30/22 0257 12/01/22 0246 12/02/22 0414 12/03/22 0323  WBC 6.4 7.5 7.7 7.4    Liver Function Tests: Recent Labs  Lab 11/29/22 0027 11/30/22 0257  12/01/22 0246 12/02/22 0414 12/03/22 0323  AST 50* 38 59* 50* 88*  ALT 24 22 28 30  49*  ALKPHOS 191* 155* 166* 152* 150*  BILITOT 0.8 1.1 0.8 1.0 0.7  PROT 8.3* 8.0 8.3* 8.5* 8.5*  ALBUMIN 3.0* 2.9* 3.1* 3.1* 3.3*   No results for input(s): "LIPASE", "AMYLASE" in the last 168 hours. No results for input(s): "AMMONIA" in the last 168 hours.  ABG    Component Value Date/Time   PHART 7.37 12/02/2022 1355   PCO2ART 43 12/02/2022 1355   PO2ART 67 (L) 12/02/2022 1355   HCO3 24.9 12/02/2022 1355   TCO2 24 11/28/2022 2035   ACIDBASEDEF 0.6 12/02/2022 1355   O2SAT 93.3 12/02/2022 1355     Coagulation Profile: Recent Labs  Lab 11/29/22 0027  INR 1.2    Cardiac Enzymes: No results for input(s): "CKTOTAL", "CKMB", "CKMBINDEX", "TROPONINI" in the last 168 hours.  HbA1C: Hgb A1c MFr Bld  Date/Time Value Ref Range Status  01/31/2019 06:43 PM 5.5 4.8 - 5.6 % Final    Comment:    (NOTE) Pre diabetes:          5.7%-6.4% Diabetes:              >6.4% Glycemic control for   <7.0% adults with diabetes     CBG: No results for input(s): "GLUCAP" in the last 168 hours.           Brett Canales Aryaa Bunting ACNP Acute Care Nurse Practitioner [facility] Please consult Amion 12/04/2022, 10:39 AM

## 2022-12-05 DIAGNOSIS — J9601 Acute respiratory failure with hypoxia: Secondary | ICD-10-CM | POA: Diagnosis not present

## 2022-12-05 DIAGNOSIS — Z720 Tobacco use: Secondary | ICD-10-CM | POA: Diagnosis not present

## 2022-12-05 DIAGNOSIS — J9621 Acute and chronic respiratory failure with hypoxia: Secondary | ICD-10-CM | POA: Diagnosis not present

## 2022-12-05 LAB — HYPERSENSITIVITY PNEUMONITIS
A. Pullulans Abs: NEGATIVE
A.Fumigatus #1 Abs: NEGATIVE
Micropolyspora faeni, IgG: NEGATIVE
Pigeon Serum Abs: NEGATIVE
Thermoact. Saccharii: NEGATIVE
Thermoactinomyces vulgaris, IgG: NEGATIVE

## 2022-12-05 LAB — CBC
HCT: 31.2 % — ABNORMAL LOW (ref 36.0–46.0)
Hemoglobin: 9.3 g/dL — ABNORMAL LOW (ref 12.0–15.0)
MCH: 23 pg — ABNORMAL LOW (ref 26.0–34.0)
MCHC: 29.8 g/dL — ABNORMAL LOW (ref 30.0–36.0)
MCV: 77 fL — ABNORMAL LOW (ref 80.0–100.0)
Platelets: 438 10*3/uL — ABNORMAL HIGH (ref 150–400)
RBC: 4.05 MIL/uL (ref 3.87–5.11)
RDW: 23.1 % — ABNORMAL HIGH (ref 11.5–15.5)
WBC: 10.4 10*3/uL (ref 4.0–10.5)
nRBC: 0 % (ref 0.0–0.2)

## 2022-12-05 MED ORDER — SPIRONOLACTONE 25 MG PO TABS: 25.0000 mg | ORAL_TABLET | Freq: Every day | ORAL | 0 refills | Status: DC

## 2022-12-05 MED ORDER — FUROSEMIDE 40 MG PO TABS: 40.0000 mg | ORAL_TABLET | Freq: Every day | ORAL | 0 refills | Status: AC

## 2022-12-05 MED ORDER — NICOTINE 14 MG/24HR TD PT24: 14.0000 mg | MEDICATED_PATCH | Freq: Every day | TRANSDERMAL | 1 refills | Status: DC

## 2022-12-05 MED ORDER — BENZONATATE 100 MG PO CAPS: 100.0000 mg | ORAL_CAPSULE | Freq: Three times a day (TID) | ORAL | 0 refills | Status: DC | PRN

## 2022-12-05 MED ORDER — NICOTINE POLACRILEX 2 MG MT GUM: 2.0000 mg | CHEWING_GUM | OROMUCOSAL | 0 refills | Status: DC | PRN

## 2022-12-05 MED ORDER — PREDNISONE 20 MG PO TABS: 20.0000 mg | ORAL_TABLET | Freq: Every day | ORAL | 0 refills | Status: AC

## 2022-12-05 MED ORDER — SERTRALINE HCL 25 MG PO TABS: 25.0000 mg | ORAL_TABLET | Freq: Every day | ORAL | 0 refills | Status: AC

## 2022-12-05 MED ORDER — PREDNISONE 20 MG PO TABS: 40.0000 mg | ORAL_TABLET | Freq: Every day | ORAL | 0 refills | Status: DC

## 2022-12-05 NOTE — Progress Notes (Signed)
Mobility Specialist Progress Note:    12/05/22 1052  Mobility  Activity Ambulated with assistance in hallway  Level of Assistance Standby assist, set-up cues, supervision of patient - no hands on  Assistive Device None  Distance Ambulated (ft) 400 ft  Activity Response Tolerated well  Mobility Referral Yes  $Mobility charge 1 Mobility  Mobility Specialist Start Time (ACUTE ONLY) 1032  Mobility Specialist Stop Time (ACUTE ONLY) 1044  Mobility Specialist Time Calculation (min) (ACUTE ONLY) 12 min   Received pt in bed having no complaints and agreeable to mobility. Ambulated on 4L/min, SPO2 WFL. Pt was asymptomatic throughout ambulation and returned to room w/o fault. Left in bed w/ call bell in reach and all needs met.   Thompson Grayer Mobility Specialist  Please contact vis Secure Chat or  Rehab Office [phone]

## 2022-12-05 NOTE — TOC Transition Note (Signed)
Transition of Care [facility] [place]) - CM/SW Discharge Note   Patient Details  Name: Nyel Hursh MRN: [id number] Date of Birth: [birth year]  Transition of Care [facility]) CM/SW Contact:  Lockie Pares, RN Phone Number: 12/05/2022, 12:03 PM   Clinical Narrative:     Discussed oxygen and set up at home. [employer] aware patient is leaving today. Patient will get a saturation monitor for home use. No further needs identified.  Final next level of care: Home/Self Care Barriers to Discharge: No Barriers Identified   Patient Goals and CMS Choice   Choice offered to / list presented to : NA  Discharge Placement                         Discharge Plan and Services Additional resources added to the After Visit Summary for   In-house Referral: NA   Post Acute Care Choice: NA          DME Arranged: N/A DME Agency: NA       HH Arranged: NA          Social Determinants of Health (SDOH) Interventions SDOH Screenings   Food Insecurity: No Food Insecurity (11/28/2022)  Housing: Low Risk  (11/28/2022)  Transportation Needs: No Transportation Needs (11/28/2022)  Utilities: Not At Risk (11/28/2022)  Tobacco Use: High Risk (12/02/2022)     Readmission Risk Interventions     No data to display

## 2022-12-05 NOTE — Care Management Important Message (Signed)
Important Message  Patient Details  Name: Kathryn Banks MRN: [id number] Date of Birth: 08-Jul-1955   Medicare Important Message Given:  Yes     Renie Ora 12/05/2022, 11:28 AM

## 2022-12-05 NOTE — Discharge Summary (Signed)
Physician Discharge Summary  Kathryn Banks WUJ:[id number] DOB: May 31, 1955 DOA: 11/28/2022  PCP: [name], MD  Admit date: 11/28/2022  Discharge date: 12/05/2022  Admitted From: Home  Disposition:  Home  Recommendations for Outpatient Follow-up:  Follow up with PCP in 1-2 weeks. Please obtain BMP/CBC in one week. Advised to follow-up with Pulmonologist Dr. Tonia Brooms in 2 weeks. Advised to take prednisone 40 mg daily for 5 more days ( Total 10 days). Advised to continue supplemental oxygen as directed. Home oxygen arranged. Advised to continue Lasix 40 mg daily, Aldactone 25 mg daily.  Home Health:None Equipment/Devices: Home oxygen  Discharge Condition: Stable CODE STATUS:Full code Diet recommendation: Heart Healthy   Brief [facility] Course: This 67 yrs old Female with COPD, long-term smoker, HTN, depression presented to ED with shortness of breath, progressively getting worse with cough and sputum production in the last 24 hours.  In ED, patient was noted to be hypoxic, O2 sats 79% on room air, improved to 92% on 2 L. CT chest ruled out PE however showed right lower lobe bronchopneumonia, cirrhosis, aortic atherosclerosis. -Admitted, treated for COPD exacerbation with antibiotics,  steroids nebs etc. as well as diuretics for diastolic CHF -Also noted to have cirrhosis this admission, suspected to be EtOH related. -9/3 PCCM consulted.   Continue Prednisone, antibiotics  and may require home oxygen. Patient has made significant improvement.  She qualified for home oxygen.  Home oxygen arranged.  Pulmonologist recommended 10-day course for prednisone.  She has completed antibiotics for pneumonia.  Home oxygen arranged.  Patient is being discharged home, advised to follow-up with pulmonologist in 2 weeks.  Patient wants to be discharged.  Discharge Diagnoses:  Principal Problem:   Acute hypoxic respiratory failure (HCC) Active Problems:   COPD with acute exacerbation (HCC)    Right lower lobe pneumonia   Essential hypertension   Continuous dependence on cigarette smoking   History of depression   Cirrhosis (HCC)   GAD (generalized anxiety disorder)   Microcytic anemia   Hyperlipidemia   Elevated brain natriuretic peptide (BNP) level   Acute diastolic CHF (congestive heart failure) (HCC)   Community acquired pneumonia of right lower lobe of lung   Acute pulmonary edema (HCC)   Acute exacerbation of chronic obstructive pulmonary disease (COPD) (HCC)   Acute on chronic respiratory failure with hypoxia (HCC)  Acute hypoxic respiratory failure: Multifactorial: COPD with acute exacerbation, right lower lung pneumonia: -CTA chest ruled out PE however showed right lower lobe pneumonia. -Respiratory virus panel negative, blood cultures negative. -Treated with IV antibiotics, steroids, DuoNebs. -Continue Dulera, Xopenex PRN, Mucinex -Switched to prednisone 9/4, DC antibiotics,  completed 5 days 9/5 -Attempt to wean O2 as tolerated. -Ambulatory O2 saturation completed.  Patient qualified for home oxygen. -Oxygen arranged.  Follow-up outpatient pulmonology.     Acute on chronic diastolic CHF: -2D echo showed EF of 65 to 70%, G1 DD, severely dilated right and left atrial size, normal right ventricular systolic function. -Diuresed with IV Lasix as well as admission, She is 7.6 L negative, weight down 10 LB. -Changed to p.o. Lasix, add Aldactone, concern for cirrhosis as well.   Microcytic anemia, folic acid deficiency, iron deficiency -Hemoglobin on presentation 8.8, low MCV,  -Anemia panel showed B12 > 273, folate very low 2.2 -Iron panel Fe 19, TIBC 470, percent saturation ratio 4, ferritin 20.  - Colonoscopy once acute respiratory illness is resolved.  FOBT negative. -Started on folate, given IV iron X 2 doses -B12 also low normal,  Continue replacement.   Nicotine abuse: -Counseled strongly on smoking cessation. -Continue nicotine patch.   Essential  hypertension -Continue amlodipine, Lopressor.   History of depression, anxiety -Continue Zoloft .   Cirrhosis (HCC), history of alcohol use -CT chest reported cirrhosis, patient has a history of chronic alcohol use. -Abdominal ultrasound showed nodular contour of the liver consistent with cirrhosis -LFTs showed AST > ALT, elevated alk phos.  PLT, INR normal - Counseled strongly on alcohol cessation   Hyperlipidemia -Hold statin until LFTs normalize     Estimated body mass index is 23.54 kg/m as calculated from the following:   Height as of this encounter: 5\' 4"  (1.626 m).   Weight as of this encounter: 62.2 kg.  Discharge Instructions  Discharge Instructions     Call MD for:  difficulty breathing, headache or visual disturbances   Complete by: As directed    Call MD for:  persistant dizziness or light-headedness   Complete by: As directed    Call MD for:  persistant nausea and vomiting   Complete by: As directed    Diet - low sodium heart healthy   Complete by: As directed    Diet Carb Modified   Complete by: As directed    Discharge instructions   Complete by: As directed    Advised to follow-up with primary care physician in 1 week. Advised to follow-up with pulmonologist Dr. Tonia Brooms in 2 weeks. Advised to take prednisone 40 mg daily for 5 more days. Advised to continue supplemental oxygen as directed. Advised to continue Lasix 40 mg daily, Aldactone 25 mg daily.   Increase activity slowly   Complete by: As directed       Allergies as of 12/05/2022   No Known Allergies      Medication List     TAKE these medications    albuterol 108 (90 Base) MCG/ACT inhaler Commonly known as: VENTOLIN HFA Inhale 1 puff into the lungs every 6 (six) hours as needed for shortness of breath.   albuterol-ipratropium 18-103 MCG/ACT inhaler Commonly known as: Combivent Inhale 2 puffs into the lungs every 6 (six) hours as needed for wheezing or shortness of breath.   amLODipine  5 MG tablet Commonly known as: NORVASC Take 1 tablet (5 mg total) by mouth daily. What changed: Another medication with the same name was removed. Continue taking this medication, and follow the directions you see here.   benzonatate 100 MG capsule Commonly known as: TESSALON Take 1 capsule (100 mg total) by mouth 3 (three) times daily as needed for cough.   diphenhydramine-acetaminophen 25-500 MG Tabs tablet Commonly known as: TYLENOL PM Take 1 tablet by mouth at bedtime.   furosemide 40 MG tablet Commonly known as: LASIX Take 1 tablet (40 mg total) by mouth daily. Start taking on: December 06, 2022   metoprolol tartrate 25 MG tablet Commonly known as: LOPRESSOR Take 0.5 tablets (12.5 mg total) by mouth 2 (two) times daily. What changed: Another medication with the same name was removed. Continue taking this medication, and follow the directions you see here.   nicotine 14 mg/24hr patch Commonly known as: NICODERM CQ - dosed in mg/24 hours Place 1 patch (14 mg total) onto the skin daily. Start taking on: December 06, 2022   nicotine polacrilex 2 MG gum Commonly known as: NICORETTE Take 1 each (2 mg total) by mouth as needed for smoking cessation.   omeprazole 20 MG tablet Commonly known as: PRILOSEC OTC Take 20 mg by mouth daily.  predniSONE 20 MG tablet Commonly known as: DELTASONE Take 1 tablet (20 mg total) by mouth daily with breakfast for 5 days. Start taking on: December 06, 2022   sertraline 25 MG tablet Commonly known as: ZOLOFT Take 1 tablet (25 mg total) by mouth at bedtime.   spironolactone 25 MG tablet Commonly known as: ALDACTONE Take 1 tablet (25 mg total) by mouth daily. Start taking on: December 06, 2022   tiotropium 18 MCG inhalation capsule Commonly known as: SPIRIVA Place 18 mcg into inhaler and inhale daily.               Durable Medical Equipment  (From admission, onward)           Start     Ordered   12/04/22 1000  For home  use only DME oxygen  Once       Question Answer Comment  Length of Need Lifetime   Mode or (Route) Nasal cannula   Liters per Minute 4   Frequency Continuous (stationary and portable oxygen unit needed)   Oxygen conserving device Yes   Oxygen delivery system Gas      12/04/22 1000            Follow-up Information     [name], MD. Go on 12/10/2022.   Specialty: Family Medicine Why: @11 :00am with Sallyanne Havers Contact information: [address] [place] [address] [phone]         [employer] Follow up.   Why: home oxygen Contact information: [phone]        Icard, Bradley L, DO Follow up on 01/02/2023.   Specialty: Pulmonary Disease Why: 10am on October 4th with Dr.Icard. Contact information: [address] [place] [address] [phone]                No Known Allergies  Consultations: Pulmonologist   Procedures/Studies: DG Chest Port 1 View  Result Date: 12/02/2022 CLINICAL DATA:  Hypoxia EXAM: PORTABLE CHEST 1 VIEW COMPARISON:  11/28/2022 FINDINGS: Heart size and pulmonary vascularity are normal for technique. Emphysematous changes in the lungs with central interstitial markings. No focal consolidation is identified. No pleural effusions. No pneumothorax. Mediastinal contours appear intact. No pleural effusions. No pneumothorax. Mediastinal contours appear intact. Degenerative changes in the spine. IMPRESSION: Diffuse interstitial markings to the lungs may represent fibrosis or edema. Multifocal pneumonia could also be considered in the appropriate clinical setting. Electronically Signed   By: Burman Nieves M.D.   On: 12/02/2022 15:48   ECHOCARDIOGRAM COMPLETE  Result Date: 11/29/2022    ECHOCARDIOGRAM REPORT   Patient Name:   Kathryn Banks Date of Exam: 11/29/2022 Medical Rec #:  [id number]   Height:       64.0 in Accession #:    [id number]  Weight:       144.8 lb Date of Birth:  [birth year]    BSA:          1.706 m  Patient Age:    66 years    BP:           133/80 mmHg Patient Gender: F           HR:           84 bpm. Exam Location:  Inpatient Procedure: 2D Echo, Cardiac Doppler and Color Doppler Indications:    Other cardiac sounds R01.2  History:        Patient has no prior history of Echocardiogram examinations.  COPD; Risk Factors:Hypertension, Dyslipidemia and Current                 Smoker. History of substance and ETOH abuse (HCC).  Sonographer:    Celesta Gentile RCS Referring Phys: [id number] SUBRINA SUNDIL IMPRESSIONS  1. Left ventricular ejection fraction, by estimation, is 65 to 70%. The left ventricle has normal function. The left ventricle has no regional wall motion abnormalities. There is mild left ventricular hypertrophy. Left ventricular diastolic parameters are consistent with Grade I diastolic dysfunction (impaired relaxation).  2. Right ventricular systolic function is normal. The right ventricular size is normal.  3. Left atrial size was severely dilated.  4. Right atrial size was severely dilated.  5. The mitral valve is normal in structure. No evidence of mitral valve regurgitation. No evidence of mitral stenosis.  6. The aortic valve is normal in structure. Aortic valve regurgitation is not visualized. No aortic stenosis is present.  7. The inferior vena cava is normal in size with greater than 50% respiratory variability, suggesting right atrial pressure of 3 mmHg. FINDINGS  Left Ventricle: Left ventricular ejection fraction, by estimation, is 65 to 70%. The left ventricle has normal function. The left ventricle has no regional wall motion abnormalities. The left ventricular internal cavity size was normal in size. There is  mild left ventricular hypertrophy. Left ventricular diastolic parameters are consistent with Grade I diastolic dysfunction (impaired relaxation). Right Ventricle: The right ventricular size is normal. No increase in right ventricular wall thickness. Right ventricular  systolic function is normal. Left Atrium: Left atrial size was severely dilated. Right Atrium: Right atrial size was severely dilated. Pericardium: There is no evidence of pericardial effusion. Mitral Valve: The mitral valve is normal in structure. No evidence of mitral valve regurgitation. No evidence of mitral valve stenosis. Tricuspid Valve: The tricuspid valve is normal in structure. Tricuspid valve regurgitation is not demonstrated. No evidence of tricuspid stenosis. Aortic Valve: The aortic valve is normal in structure. Aortic valve regurgitation is not visualized. No aortic stenosis is present. Aortic valve mean gradient measures 8.0 mmHg. Aortic valve peak gradient measures 16.6 mmHg. Aortic valve area, by VTI measures 1.81 cm. Pulmonic Valve: The pulmonic valve was normal in structure. Pulmonic valve regurgitation is not visualized. No evidence of pulmonic stenosis. Aorta: The aortic root is normal in size and structure. Venous: The inferior vena cava is normal in size with greater than 50% respiratory variability, suggesting right atrial pressure of 3 mmHg. IAS/Shunts: No atrial level shunt detected by color flow Doppler.  LEFT VENTRICLE PLAX 2D LVIDd:         5.40 cm   Diastology LVIDs:         3.20 cm   LV e' medial:    8.81 cm/s LV PW:         1.20 cm   LV E/e' medial:  10.9 LV IVS:        0.90 cm   LV e' lateral:   10.20 cm/s LVOT diam:     1.85 cm   LV E/e' lateral: 9.5 LV SV:         69 LV SV Index:   41 LVOT Area:     2.69 cm  RIGHT VENTRICLE RV S prime:     21.80 cm/s TAPSE (M-mode): 2.7 cm LEFT ATRIUM              Index        RIGHT ATRIUM  Index LA diam:        3.90 cm  2.29 cm/m   RA Area:     23.60 cm LA Vol (A2C):   99.1 ml  58.10 ml/m  RA Volume:   73.00 ml  42.80 ml/m LA Vol (A4C):   120.0 ml 70.35 ml/m LA Biplane Vol: 117.0 ml 68.59 ml/m  AORTIC VALVE AV Area (Vmax):    1.86 cm AV Area (Vmean):   2.03 cm AV Area (VTI):     1.81 cm AV Vmax:           204.00 cm/s AV Vmean:           127.000 cm/s AV VTI:            0.383 m AV Peak Grad:      16.6 mmHg AV Mean Grad:      8.0 mmHg LVOT Vmax:         141.00 cm/s LVOT Vmean:        96.000 cm/s LVOT VTI:          0.258 m LVOT/AV VTI ratio: 0.67  AORTA Ao Root diam: 3.40 cm MITRAL VALVE MV Area (PHT): 4.06 cm    SHUNTS MV Decel Time: 187 msec    Systemic VTI:  0.26 m MV E velocity: 96.40 cm/s  Systemic Diam: 1.85 cm MV A velocity: 93.00 cm/s MV E/A ratio:  1.04 Donato Schultz MD Electronically signed by Donato Schultz MD Signature Date/Time: 11/29/2022/4:16:23 PM    Final    US ABDOMEN LIMITED WITH LIVER DOPPLER  Result Date: 11/28/2022 CLINICAL DATA:  Cirrhosis EXAM: DUPLEX ULTRASOUND OF LIVER TECHNIQUE: Color and duplex Doppler ultrasound was performed to evaluate the hepatic in-flow and out-flow vessels. COMPARISON:  11/28/2022 FINDINGS: Liver: Normal parenchymal echogenicity. Nodularity of the liver capsule as identified on earlier CT, consistent with cirrhosis. No focal lesion, mass or intrahepatic biliary ductal dilatation. Gallbladder: No evidence of cholelithiasis or cholecystitis. Common bile duct: 8 mm. Main Portal Vein size: 1.4 cm Portal Vein Velocities Main Prox:  32.5 cm/sec Main Mid: 20.4 cm/sec Main Dist:  28.1 cm/sec Right: 22.2 cm/sec Left: 19.5 cm/sec Normal directional flow within the portal vein. Hepatic Vein Velocities Right:  7.0 cm/sec Middle:  60.5 cm/sec Left:  50.8 cm/sec Normal directional flow within the hepatic veins. IVC: Present and patent with normal respiratory phasicity. Hepatic Artery Velocity:  92.5 cm/sec Splenic Vein Velocity:  15.4 cm/sec Spleen: 10.8 cm x 5.9 cm x 6.2 cm with a total volume of 206 cm^3 (411 cm^3 is upper limit normal) Portal Vein Occlusion/Thrombus: No Splenic Vein Occlusion/Thrombus: No Ascites: None Varices: None IMPRESSION: 1. Nodular contour of the liver consistent with cirrhosis. No focal liver abnormality. 2. Unremarkable Doppler evaluation. Normal directional flow within the  portal vein. Electronically Signed   By: Sharlet Salina M.D.   On: 11/28/2022 23:02   CT Angio Chest PE W and/or Wo Contrast  Result Date: 11/28/2022 CLINICAL DATA:  Shortness of breath.  PE suspected EXAM: CT ANGIOGRAPHY CHEST WITH CONTRAST TECHNIQUE: Multidetector CT imaging of the chest was performed using the standard protocol during bolus administration of intravenous contrast. Multiplanar CT image reconstructions and MIPs were obtained to evaluate the vascular anatomy. RADIATION DOSE REDUCTION: This exam was performed according to the departmental dose-optimization program which includes automated exposure control, adjustment of the mA and/or kV according to patient size and/or use of iterative reconstruction technique. CONTRAST:  75mL OMNIPAQUE IOHEXOL 350 MG/ML SOLN COMPARISON:  Chest radiograph 11/28/2022 and  CTA chest 01/31/2019 FINDINGS: Cardiovascular: Negative for acute pulmonary embolism. Normal caliber thoracic aorta. No pericardial effusion. Coronary artery and aortic atherosclerotic calcification. Mediastinum/Nodes: Layering debris in the trachea. Unremarkable esophagus. Prominent mediastinal and hilar nodes. For example 11 mm right hilar node on 06/183. Lungs/Pleura: Layering debris in the right mainstem bronchus. Additional mucous plugging/debris in the right lower lobe bronchi. Patchy ground-glass and consolidative opacities with interlobular septal thickening bilaterally greatest in the right lower lobe. No pleural effusion or pneumothorax. Mosaic attenuation compatible with air trapping. Biapical pleural-parenchymal scarring. Multiple predominantly subpleural pulmonary nodules measuring up to 6 mm are similar to 2020. No follow-up recommended. Upper Abdomen: Nodular contour of the liver compatible with cirrhosis. No acute abnormality. Musculoskeletal: No acute fracture. Review of the MIP images confirms the above findings. IMPRESSION: 1. Negative for acute pulmonary embolism. 2. Right lower  lobe bronchopneumonia. 3. Cirrhosis. Aortic Atherosclerosis (ICD10-I70.0). Electronically Signed   By: Minerva Fester M.D.   On: 11/28/2022 20:33   DG Chest 2 View  Result Date: 11/28/2022 CLINICAL DATA:  COPD. Shortness of breath with low oxygen saturations. EXAM: CHEST - 2 VIEW COMPARISON:  01/31/2019 FINDINGS: Borderline heart size with normal pulmonary vascularity. Emphysematous changes in the lungs. Hazy interstitial pattern consistent with respiratory bronchiolitis. No consolidation or edema. No pleural effusions. No pneumothorax. Mediastinal contours appear intact. Calcified and tortuous aorta. Degenerative changes in the spine. IMPRESSION: Emphysematous and chronic bronchitic changes in the lungs. No focal consolidation. Electronically Signed   By: Burman Nieves M.D.   On: 11/28/2022 19:19     Subjective: Patient was seen and examined at bedside.  Overnight events noted.   Patient reports  doing much better, she wants to be discharged.  Home oxygen is arranged.  Discharge Exam: Vitals:   12/05/22 0737 12/05/22 1148  BP: 126/85 (!) 138/98  Pulse: 67 64  Resp: 18 20  Temp: 98.7 F (37.1 C)   SpO2: 90% 90%   Vitals:   12/05/22 0020 12/05/22 0455 12/05/22 0737 12/05/22 1148  BP:  119/80 126/85 (!) 138/98  Pulse: 66 70 67 64  Resp:  18 18 20   Temp:  98.1 F (36.7 C) 98.7 F (37.1 C)   TempSrc:  Oral Oral   SpO2: 94% (!) 89% 90% 90%  Weight:  61.4 kg    Height:        General: Pt is alert, awake, not in acute distress Cardiovascular: RRR, S1/S2 +, no rubs, no gallops Respiratory: CTA bilaterally, no wheezing, no rhonchi Abdominal: Soft, NT, ND, bowel sounds + Extremities: no edema, no cyanosis    The results of significant diagnostics from this hospitalization (including imaging, microbiology, ancillary and laboratory) are listed below for reference.     Microbiology: Recent Results (from the past 240 hour(s))  MRSA Next Gen by PCR, Nasal     Status: None    Collection Time: 11/29/22 12:20 AM   Specimen: Nasal Mucosa; Nasal Swab  Result Value Ref Range Status   MRSA by PCR Next Gen NOT DETECTED NOT DETECTED Final    Comment: (NOTE) The GeneXpert MRSA Assay (FDA approved for NASAL specimens only), is one component of a comprehensive MRSA colonization surveillance program. It is not intended to diagnose MRSA infection nor to guide or monitor treatment for MRSA infections. Test performance is not FDA approved in patients less than 27 years old. Performed at [facility], [address], [place], [place] [address]   Culture, blood (Routine X 2) w Reflex to ID  Panel     Status: None   Collection Time: 11/29/22 12:25 AM   Specimen: BLOOD RIGHT ARM  Result Value Ref Range Status   Specimen Description BLOOD RIGHT ARM  Final   Special Requests   Final    BOTTLES DRAWN AEROBIC AND ANAEROBIC Blood Culture results may not be optimal due to an excessive volume of blood received in culture bottles   Culture   Final    NO GROWTH 5 DAYS Performed at [facility], [address], [place], [place] [address]    Report Status 12/04/2022 FINAL  Final  Culture, blood (Routine X 2) w Reflex to ID Panel     Status: None   Collection Time: 11/29/22 12:27 AM   Specimen: BLOOD LEFT ARM  Result Value Ref Range Status   Specimen Description BLOOD LEFT ARM  Final   Special Requests   Final    BOTTLES DRAWN AEROBIC AND ANAEROBIC Blood Culture results may not be optimal due to an excessive volume of blood received in culture bottles   Culture   Final    NO GROWTH 5 DAYS Performed at [facility], [address] [place], [place] [address]    Report Status 12/04/2022 FINAL  Final  Respiratory (~20 pathogens) panel by PCR     Status: None   Collection Time: 11/29/22  3:28 AM   Specimen: Nasopharyngeal Swab; Respiratory  Result Value Ref Range Status   Adenovirus NOT DETECTED NOT DETECTED Final   Coronavirus 229E NOT DETECTED NOT DETECTED Final     Comment: (NOTE) The Coronavirus on the Respiratory Panel, DOES NOT test for the novel  Coronavirus (2019 nCoV)    Coronavirus HKU1 NOT DETECTED NOT DETECTED Final   Coronavirus NL63 NOT DETECTED NOT DETECTED Final   Coronavirus OC43 NOT DETECTED NOT DETECTED Final   Metapneumovirus NOT DETECTED NOT DETECTED Final   Rhinovirus / Enterovirus NOT DETECTED NOT DETECTED Final   Influenza A NOT DETECTED NOT DETECTED Final   Influenza B NOT DETECTED NOT DETECTED Final   Parainfluenza Virus 1 NOT DETECTED NOT DETECTED Final   Parainfluenza Virus 2 NOT DETECTED NOT DETECTED Final   Parainfluenza Virus 3 NOT DETECTED NOT DETECTED Final   Parainfluenza Virus 4 NOT DETECTED NOT DETECTED Final   Respiratory Syncytial Virus NOT DETECTED NOT DETECTED Final   Bordetella pertussis NOT DETECTED NOT DETECTED Final   Bordetella Parapertussis NOT DETECTED NOT DETECTED Final   Chlamydophila pneumoniae NOT DETECTED NOT DETECTED Final   Mycoplasma pneumoniae NOT DETECTED NOT DETECTED Final    Comment: Performed at [facility], [address], [place], [place] [address]  Expectorated Sputum Assessment w Gram Stain, Rflx to Resp Cult     Status: None   Collection Time: 11/29/22  3:28 AM   Specimen: Sputum  Result Value Ref Range Status   Specimen Description SPUTUM  Final   Special Requests NONE  Final   Sputum evaluation   Final    THIS SPECIMEN IS ACCEPTABLE FOR SPUTUM CULTURE Performed at [facility], [address] [place], [place] [address]    Report Status 11/29/2022 FINAL  Final  Culture, Respiratory w Gram Stain     Status: None   Collection Time: 11/29/22  3:28 AM   Specimen: SPU  Result Value Ref Range Status   Specimen Description SPUTUM  Final   Special Requests NONE Reflexed from [id number]  Final   Gram Stain   Final  MODERATE WBC PRESENT,BOTH PMN AND MONONUCLEAR FEW SQUAMOUS EPITHELIAL CELLS PRESENT FEW GRAM NEGATIVE RODS RARE GRAM POSITIVE COCCI IN PAIRS    Culture    Final    FEW Normal respiratory flora-no Staph aureus or Pseudomonas seen Performed at [facility], [address] [place], [place] [address]    Report Status 12/01/2022 FINAL  Final  Respiratory (~20 pathogens) panel by PCR     Status: None   Collection Time: 12/02/22  6:00 PM   Specimen: Nasopharyngeal Swab; Respiratory  Result Value Ref Range Status   Adenovirus NOT DETECTED NOT DETECTED Final   Coronavirus 229E NOT DETECTED NOT DETECTED Final    Comment: (NOTE) The Coronavirus on the Respiratory Panel, DOES NOT test for the novel  Coronavirus (2019 nCoV)    Coronavirus HKU1 NOT DETECTED NOT DETECTED Final   Coronavirus NL63 NOT DETECTED NOT DETECTED Final   Coronavirus OC43 NOT DETECTED NOT DETECTED Final   Metapneumovirus NOT DETECTED NOT DETECTED Final   Rhinovirus / Enterovirus NOT DETECTED NOT DETECTED Final   Influenza A NOT DETECTED NOT DETECTED Final   Influenza B NOT DETECTED NOT DETECTED Final   Parainfluenza Virus 1 NOT DETECTED NOT DETECTED Final   Parainfluenza Virus 2 NOT DETECTED NOT DETECTED Final   Parainfluenza Virus 3 NOT DETECTED NOT DETECTED Final   Parainfluenza Virus 4 NOT DETECTED NOT DETECTED Final   Respiratory Syncytial Virus NOT DETECTED NOT DETECTED Final   Bordetella pertussis NOT DETECTED NOT DETECTED Final   Bordetella Parapertussis NOT DETECTED NOT DETECTED Final   Chlamydophila pneumoniae NOT DETECTED NOT DETECTED Final   Mycoplasma pneumoniae NOT DETECTED NOT DETECTED Final    Comment: Performed at [facility], [address] [place], [place] [address]     Labs: BNP (last 3 results) Recent Labs    11/28/22 1915 12/02/22 0410  BNP 355.4* 154.2*   Basic Metabolic Panel: Recent Labs  Lab 11/29/22 1905 11/30/22 0257 12/01/22 0246 12/02/22 0414 12/03/22 0323 12/04/22 0300  NA  --  135 131* 134* 132* 134*  K 3.8 4.7 4.7 4.8 4.7 3.9  CL  --  101 99 103 99 99  CO2  --  24 22 24 27 25   GLUCOSE  --  147* 166* 118*  117* 94  BUN  --  11 13 17 19 22   CREATININE  --  0.66 0.85 0.98 0.87 0.95  CALCIUM  --  8.6* 8.4* 8.9 9.1 8.9  MG 1.4*  --   --   --  1.8  --    Liver Function Tests: Recent Labs  Lab 11/29/22 0027 11/30/22 0257 12/01/22 0246 12/02/22 0414 12/03/22 0323  AST 50* 38 59* 50* 88*  ALT 24 22 28 30  49*  ALKPHOS 191* 155* 166* 152* 150*  BILITOT 0.8 1.1 0.8 1.0 0.7  PROT 8.3* 8.0 8.3* 8.5* 8.5*  ALBUMIN 3.0* 2.9* 3.1* 3.1* 3.3*   No results for input(s): "LIPASE", "AMYLASE" in the last 168 hours. No results for input(s): "AMMONIA" in the last 168 hours. CBC: Recent Labs  Lab 11/30/22 0257 12/01/22 0246 12/02/22 0414 12/03/22 0323 12/05/22 0325  WBC 6.4 7.5 7.7 7.4 10.4  HGB 7.9* 9.0* 8.6* 9.0* 9.3*  HCT 27.2* 31.0* 29.8* 30.8* 31.2*  MCV 75.3* 77.9* 76.4* 77.8* 77.0*  PLT 319 365 365 420* 438*   Cardiac Enzymes: No results for input(s): "CKTOTAL", "CKMB", "CKMBINDEX", "TROPONINI" in the last 168 hours. BNP: Invalid input(s): "POCBNP" CBG: No results for input(s): "GLUCAP"  in the last 168 hours. D-Dimer No results for input(s): "DDIMER" in the last 72 hours. Hgb A1c No results for input(s): "HGBA1C" in the last 72 hours. Lipid Profile No results for input(s): "CHOL", "HDL", "LDLCALC", "TRIG", "CHOLHDL", "LDLDIRECT" in the last 72 hours. Thyroid function studies No results for input(s): "TSH", "T4TOTAL", "T3FREE", "THYROIDAB" in the last 72 hours.  Invalid input(s): "FREET3" Anemia work up No results for input(s): "VITAMINB12", "FOLATE", "FERRITIN", "TIBC", "IRON", "RETICCTPCT" in the last 72 hours. Urinalysis    Component Value Date/Time   COLORURINE YELLOW (A) 04/20/2016 0710   APPEARANCEUR CLEAR (A) 04/20/2016 0710   APPEARANCEUR Clear 04/22/2013 0408   LABSPEC 1.004 (L) 04/20/2016 0710   LABSPEC 1.026 04/22/2013 0408   PHURINE 7.0 04/20/2016 0710   GLUCOSEU NEGATIVE 04/20/2016 0710   GLUCOSEU Negative 04/22/2013 0408   HGBUR MODERATE (A) 04/20/2016 0710    BILIRUBINUR NEGATIVE 04/20/2016 0710   BILIRUBINUR Negative 04/22/2013 0408   KETONESUR NEGATIVE 04/20/2016 0710   PROTEINUR NEGATIVE 04/20/2016 0710   NITRITE NEGATIVE 04/20/2016 0710   LEUKOCYTESUR TRACE (A) 04/20/2016 0710   LEUKOCYTESUR Negative 04/22/2013 0408   Sepsis Labs Recent Labs  Lab 12/01/22 0246 12/02/22 0414 12/03/22 0323 12/05/22 0325  WBC 7.5 7.7 7.4 10.4   Microbiology Recent Results (from the past 240 hour(s))  MRSA Next Gen by PCR, Nasal     Status: None   Collection Time: 11/29/22 12:20 AM   Specimen: Nasal Mucosa; Nasal Swab  Result Value Ref Range Status   MRSA by PCR Next Gen NOT DETECTED NOT DETECTED Final    Comment: (NOTE) The GeneXpert MRSA Assay (FDA approved for NASAL specimens only), is one component of a comprehensive MRSA colonization surveillance program. It is not intended to diagnose MRSA infection nor to guide or monitor treatment for MRSA infections. Test performance is not FDA approved in patients less than 9 years old. Performed at [facility], [address] [place], [place] [address]   Culture, blood (Routine X 2) w Reflex to ID Panel     Status: None   Collection Time: 11/29/22 12:25 AM   Specimen: BLOOD RIGHT ARM  Result Value Ref Range Status   Specimen Description BLOOD RIGHT ARM  Final   Special Requests   Final    BOTTLES DRAWN AEROBIC AND ANAEROBIC Blood Culture results may not be optimal due to an excessive volume of blood received in culture bottles   Culture   Final    NO GROWTH 5 DAYS Performed at [facility], [address], [place], [place] [address]    Report Status 12/04/2022 FINAL  Final  Culture, blood (Routine X 2) w Reflex to ID Panel     Status: None   Collection Time: 11/29/22 12:27 AM   Specimen: BLOOD LEFT ARM  Result Value Ref Range Status   Specimen Description BLOOD LEFT ARM  Final   Special Requests   Final    BOTTLES DRAWN AEROBIC AND ANAEROBIC Blood Culture results may not be  optimal due to an excessive volume of blood received in culture bottles   Culture   Final    NO GROWTH 5 DAYS Performed at [facility], [address], [place], [place] [address]    Report Status 12/04/2022 FINAL  Final  Respiratory (~20 pathogens) panel by PCR     Status: None   Collection Time: 11/29/22  3:28 AM   Specimen: Nasopharyngeal Swab; Respiratory  Result Value Ref Range Status  Adenovirus NOT DETECTED NOT DETECTED Final   Coronavirus 229E NOT DETECTED NOT DETECTED Final    Comment: (NOTE) The Coronavirus on the Respiratory Panel, DOES NOT test for the novel  Coronavirus (2019 nCoV)    Coronavirus HKU1 NOT DETECTED NOT DETECTED Final   Coronavirus NL63 NOT DETECTED NOT DETECTED Final   Coronavirus OC43 NOT DETECTED NOT DETECTED Final   Metapneumovirus NOT DETECTED NOT DETECTED Final   Rhinovirus / Enterovirus NOT DETECTED NOT DETECTED Final   Influenza A NOT DETECTED NOT DETECTED Final   Influenza B NOT DETECTED NOT DETECTED Final   Parainfluenza Virus 1 NOT DETECTED NOT DETECTED Final   Parainfluenza Virus 2 NOT DETECTED NOT DETECTED Final   Parainfluenza Virus 3 NOT DETECTED NOT DETECTED Final   Parainfluenza Virus 4 NOT DETECTED NOT DETECTED Final   Respiratory Syncytial Virus NOT DETECTED NOT DETECTED Final   Bordetella pertussis NOT DETECTED NOT DETECTED Final   Bordetella Parapertussis NOT DETECTED NOT DETECTED Final   Chlamydophila pneumoniae NOT DETECTED NOT DETECTED Final   Mycoplasma pneumoniae NOT DETECTED NOT DETECTED Final    Comment: Performed at [facility], [address] [place], [place] [address]  Expectorated Sputum Assessment w Gram Stain, Rflx to Resp Cult     Status: None   Collection Time: 11/29/22  3:28 AM   Specimen: Sputum  Result Value Ref Range Status   Specimen Description SPUTUM  Final   Special Requests NONE  Final   Sputum evaluation   Final    THIS SPECIMEN IS ACCEPTABLE FOR SPUTUM CULTURE Performed at [facility], [address] [place], [place] [address]    Report Status 11/29/2022 FINAL  Final  Culture, Respiratory w Gram Stain     Status: None   Collection Time: 11/29/22  3:28 AM   Specimen: SPU  Result Value Ref Range Status   Specimen Description SPUTUM  Final   Special Requests NONE Reflexed from [id number]  Final   Gram Stain   Final    MODERATE WBC PRESENT,BOTH PMN AND MONONUCLEAR FEW SQUAMOUS EPITHELIAL CELLS PRESENT FEW GRAM NEGATIVE RODS RARE GRAM POSITIVE COCCI IN PAIRS    Culture   Final    FEW Normal respiratory flora-no Staph aureus or Pseudomonas seen Performed at [facility], [address], [place], [place] [address]    Report Status 12/01/2022 FINAL  Final  Respiratory (~20 pathogens) panel by PCR     Status: None   Collection Time: 12/02/22  6:00 PM   Specimen: Nasopharyngeal Swab; Respiratory  Result Value Ref Range Status   Adenovirus NOT DETECTED NOT DETECTED Final   Coronavirus 229E NOT DETECTED NOT DETECTED Final    Comment: (NOTE) The Coronavirus on the Respiratory Panel, DOES NOT test for the novel  Coronavirus (2019 nCoV)    Coronavirus HKU1 NOT DETECTED NOT DETECTED Final   Coronavirus NL63 NOT DETECTED NOT DETECTED Final   Coronavirus OC43 NOT DETECTED NOT DETECTED Final   Metapneumovirus NOT DETECTED NOT DETECTED Final   Rhinovirus / Enterovirus NOT DETECTED NOT DETECTED Final   Influenza A NOT DETECTED NOT DETECTED Final   Influenza B NOT DETECTED NOT DETECTED Final   Parainfluenza Virus 1 NOT DETECTED NOT DETECTED Final   Parainfluenza Virus 2 NOT DETECTED NOT DETECTED Final   Parainfluenza Virus 3 NOT DETECTED NOT DETECTED Final   Parainfluenza Virus 4 NOT DETECTED NOT DETECTED Final   Respiratory Syncytial Virus NOT DETECTED NOT DETECTED Final   Bordetella pertussis  NOT DETECTED NOT DETECTED Final   Bordetella Parapertussis NOT DETECTED NOT DETECTED Final   Chlamydophila pneumoniae NOT DETECTED NOT DETECTED Final   Mycoplasma  pneumoniae NOT DETECTED NOT DETECTED Final    Comment: Performed at [facility], [address] [place], [place] [address]     Time coordinating discharge: Over 30 minutes  SIGNED:   Willeen Niece, MD  [facility] 12/05/2022, 12:57 PM Pager   If 7PM-7AM, please contact night-coverage

## 2022-12-05 NOTE — Progress Notes (Addendum)
NAME:  Kathryn Banks, MRN:  [id number], DOB:  [birth year], LOS: 7 ADMISSION DATE:  11/28/2022, CONSULTATION DATE:  9/3 REFERRING MD:  Dr. Isidoro Donning, CHIEF COMPLAINT:  CHF exacerbation   History of Present Illness:  Patient is a 67 yo F w/ pertinent pmh copd, tobacco abuse presents to [facility] ED on 8/30 w/ SOB.  Patient states sob has been progressively worsening over the past 24 hours. Denies fever/chills, chest pain. Came to [facility] on 8/30. Sats 79% on room air and improved to 92% on 2L [place]. BNP 355. CXR showing emphysematious and chronic bronchiectatic change in lung; no consolidation. CTA ruled out PE; RLL bronchopneumonia. Patient admitted to floors and treated for CAP w/ azithro/rocephin. Given duoneb and solumedrol. Echo showing LVEF 65-70%; G1 DD. Patient given lasix. RVP, urine strep negative; legionella pending.   On 9/3 patient with increasing o2 requirements up to 8L  when walking w/ nurse. Consulted PCCM.   Pertinent  Medical History   Past Medical History:  Diagnosis Date   Acid reflux    Arthritis    FINGERS   COPD (chronic obstructive pulmonary disease) (HCC)    Depression    ETOH abuse    Hepatitis    C-PT STATED SHE WAS TOLD THIS 2014   Hypertension    OFF MEDS FOR FEW YEARS     Significant Hospital Events: Including procedures, antibiotic start and stop dates in addition to other pertinent events   8/30 ARF w/ hypoxia 9/3 increasing o2 requirements; pccm consulted 12/03/2022 appears much improved 12/04/2022 O2 requirements have gone up to 4 L nasal cannula  Interim History / Subjective:  Continues to improve  Objective   Blood pressure 126/85, pulse 67, temperature 98.7 F (37.1 C), temperature source Oral, resp. rate 18, height 5\' 4"  (1.626 m), weight 61.4 kg, last menstrual period 09/25/2005, SpO2 90%.        Intake/Output Summary (Last 24 hours) at 12/05/2022 0932 Last data filed at 12/05/2022 0109 Gross per 24 hour  Intake 1080 ml  Output 2150 ml  Net -1070 ml    Filed Weights   12/03/22 0507 12/04/22 0458 12/05/22 0455  Weight: 62.2 kg 62.1 kg 61.4 kg    Examination: Awake alert no acute distress Currently on 3 L nasal cannula sats of 94% No JVD lymphadenopathy is appreciated Decreased breath sounds throughout Heart sounds are regular regular rate and rhythm Abdomen soft nontender positive bowel sounds Extremities without edema, hands with finger clubbing  Resolved Hospital Problem list     Assessment & Plan:  Acute respiratory failure w/ hypoxia COPD w/ acute exacerbation RLL pneumonia Acute on chronic diastolic chf Tobacco abuse EtOH abuse Plan:  She will need nocturnal oxygen at 3 to 4 L/min and she will need increased oxygen for ambulation 4 to 6 L. She has a follow-up appointment scheduled with Dr. Tonia Brooms of the pulmonary service.  Note 2D echo demonstrates left atrial and right atrial enlargement with normal EF.  She is -9 L over the last 7 days. Continue nicotine patches No further smoking Wean steroids Pulmonary critical care will sign off at this time.            Best Practice (right click and "Reselect all SmartList Selections" daily)   Per primary  Labs   CBC: Recent Labs  Lab 11/30/22 0257 12/01/22 0246 12/02/22 0414 12/03/22 0323 12/05/22 0325  WBC 6.4 7.5 7.7 7.4 10.4  HGB 7.9* 9.0* 8.6* 9.0* 9.3*  HCT 27.2* 31.0* 29.8* 30.8* 31.2*  MCV 75.3* 77.9* 76.4* 77.8* 77.0*  PLT 319 365 365 420* 438*    Basic Metabolic Panel: Recent Labs  Lab 11/29/22 1905 11/30/22 0257 12/01/22 0246 12/02/22 0414 12/03/22 0323 12/04/22 0300  NA  --  135 131* 134* 132* 134*  K 3.8 4.7 4.7 4.8 4.7 3.9  CL  --  101 99 103 99 99  CO2  --  24 22 24 27 25   GLUCOSE  --  147* 166* 118* 117* 94  BUN  --  11 13 17 19 22   CREATININE  --  0.66 0.85 0.98 0.87 0.95  CALCIUM  --  8.6* 8.4* 8.9 9.1 8.9  MG 1.4*  --   --   --  1.8  --    GFR: Estimated Creatinine Clearance: 51 mL/min (by C-G formula based on SCr of  0.95 mg/dL). Recent Labs  Lab 12/01/22 0246 12/02/22 0414 12/03/22 0323 12/05/22 0325  WBC 7.5 7.7 7.4 10.4    Liver Function Tests: Recent Labs  Lab 11/29/22 0027 11/30/22 0257 12/01/22 0246 12/02/22 0414 12/03/22 0323  AST 50* 38 59* 50* 88*  ALT 24 22 28 30  49*  ALKPHOS 191* 155* 166* 152* 150*  BILITOT 0.8 1.1 0.8 1.0 0.7  PROT 8.3* 8.0 8.3* 8.5* 8.5*  ALBUMIN 3.0* 2.9* 3.1* 3.1* 3.3*   No results for input(s): "LIPASE", "AMYLASE" in the last 168 hours. No results for input(s): "AMMONIA" in the last 168 hours.  ABG    Component Value Date/Time   PHART 7.37 12/02/2022 1355   PCO2ART 43 12/02/2022 1355   PO2ART 67 (L) 12/02/2022 1355   HCO3 24.9 12/02/2022 1355   TCO2 24 11/28/2022 2035   ACIDBASEDEF 0.6 12/02/2022 1355   O2SAT 93.3 12/02/2022 1355     Coagulation Profile: Recent Labs  Lab 11/29/22 0027  INR 1.2    Cardiac Enzymes: No results for input(s): "CKTOTAL", "CKMB", "CKMBINDEX", "TROPONINI" in the last 168 hours.  HbA1C: Hgb A1c MFr Bld  Date/Time Value Ref Range Status  01/31/2019 06:43 PM 5.5 4.8 - 5.6 % Final    Comment:    (NOTE) Pre diabetes:          5.7%-6.4% Diabetes:              >6.4% Glycemic control for   <7.0% adults with diabetes     CBG: No results for input(s): "GLUCAP" in the last 168 hours.           Brett Canales Jahdai Padovano ACNP Acute Care Nurse Practitioner [facility] Please consult Amion 12/05/2022, 9:32 AM

## 2022-12-05 NOTE — Discharge Instructions (Signed)
Advised to follow-up with pulmonologist Dr. Tonia Brooms in 2 weeks. Advised to take prednisone 40 mg daily for 5 more days. Advised to continue supplemental oxygen as directed. Advised to continue Lasix 40 mg daily, Aldactone 25 mg daily.

## 2022-12-05 NOTE — Progress Notes (Signed)
DISCHARGE NOTE HOME Kathryn Banks to be discharged Home per MD order. Discussed prescriptions and follow up appointments with the patient. Prescriptions given to patient; medication list explained in detail. Patient verbalized understanding.  Skin clean, dry and intact without evidence of skin break down, no evidence of skin tears noted. IV catheter discontinued intact. Site without signs and symptoms of complications. Dressing and pressure applied. Pt denies pain at the site currently. No complaints noted.  Patient free of lines, drains, and wounds.   An After Visit Summary (AVS) was printed and given to the patient. Patient escorted via wheelchair, and discharged home via private auto.  Lorine Bears, RN

## 2022-12-05 NOTE — Plan of Care (Signed)
  Problem: Activity: Goal: Ability to tolerate increased activity will improve Outcome: Progressing   

## 2022-12-12 ENCOUNTER — Emergency Department (HOSPITAL_COMMUNITY): Payer: Medicare Other

## 2022-12-12 ENCOUNTER — Encounter (HOSPITAL_COMMUNITY): Payer: Self-pay

## 2022-12-12 ENCOUNTER — Emergency Department (HOSPITAL_COMMUNITY)
Admission: EM | Admit: 2022-12-12 | Discharge: 2022-12-12 | Disposition: A | Discharge status: Home / Self Care | Payer: Medicare Other | Attending: Emergency Medicine | Admitting: Emergency Medicine

## 2022-12-12 ENCOUNTER — Other Ambulatory Visit: Payer: Self-pay

## 2022-12-12 DIAGNOSIS — F17210 Nicotine dependence, cigarettes, uncomplicated: Secondary | ICD-10-CM | POA: Diagnosis not present

## 2022-12-12 DIAGNOSIS — I5031 Acute diastolic (congestive) heart failure: Secondary | ICD-10-CM | POA: Diagnosis not present

## 2022-12-12 DIAGNOSIS — E039 Hypothyroidism, unspecified: Secondary | ICD-10-CM | POA: Insufficient documentation

## 2022-12-12 DIAGNOSIS — R899 Unspecified abnormal finding in specimens from other organs, systems and tissues: Secondary | ICD-10-CM

## 2022-12-12 DIAGNOSIS — J449 Chronic obstructive pulmonary disease, unspecified: Secondary | ICD-10-CM | POA: Insufficient documentation

## 2022-12-12 DIAGNOSIS — I110 Hypertensive heart disease with heart failure: Secondary | ICD-10-CM | POA: Insufficient documentation

## 2022-12-12 DIAGNOSIS — Z1152 Encounter for screening for COVID-19: Secondary | ICD-10-CM | POA: Insufficient documentation

## 2022-12-12 DIAGNOSIS — Z79899 Other long term (current) drug therapy: Secondary | ICD-10-CM | POA: Insufficient documentation

## 2022-12-12 DIAGNOSIS — R799 Abnormal finding of blood chemistry, unspecified: Secondary | ICD-10-CM | POA: Diagnosis not present

## 2022-12-12 LAB — CBC
HCT: 35 % — ABNORMAL LOW (ref 36.0–46.0)
Hemoglobin: 10.5 g/dL — ABNORMAL LOW (ref 12.0–15.0)
MCH: 23.8 pg — ABNORMAL LOW (ref 26.0–34.0)
MCHC: 30 g/dL (ref 30.0–36.0)
MCV: 79.4 fL — ABNORMAL LOW (ref 80.0–100.0)
Platelets: 521 10*3/uL — ABNORMAL HIGH (ref 150–400)
RBC: 4.41 MIL/uL (ref 3.87–5.11)
RDW: 25.2 % — ABNORMAL HIGH (ref 11.5–15.5)
WBC: 12.5 10*3/uL — ABNORMAL HIGH (ref 4.0–10.5)
nRBC: 0 % (ref 0.0–0.2)

## 2022-12-12 LAB — COMPREHENSIVE METABOLIC PANEL
ALT: 89 U/L — ABNORMAL HIGH (ref 0–44)
AST: 76 U/L — ABNORMAL HIGH (ref 15–41)
Albumin: 3.6 g/dL (ref 3.5–5.0)
Alkaline Phosphatase: 110 U/L (ref 38–126)
Anion gap: 19 — ABNORMAL HIGH (ref 5–15)
BUN: 14 mg/dL (ref 8–23)
CO2: 21 mmol/L — ABNORMAL LOW (ref 22–32)
Calcium: 9.2 mg/dL (ref 8.9–10.3)
Chloride: 94 mmol/L — ABNORMAL LOW (ref 98–111)
Creatinine, Ser: 1.08 mg/dL — ABNORMAL HIGH (ref 0.44–1.00)
GFR, Estimated: 57 mL/min — ABNORMAL LOW (ref >60)
Glucose, Bld: 97 mg/dL (ref 70–99)
Potassium: 4.5 mmol/L (ref 3.5–5.1)
Sodium: 134 mmol/L — ABNORMAL LOW (ref 135–145)
Total Bilirubin: 0.7 mg/dL (ref 0.3–1.2)
Total Protein: 7.8 g/dL (ref 6.5–8.1)

## 2022-12-12 LAB — RESP PANEL BY RT-PCR (RSV, FLU A&B, COVID)  RVPGX2
Influenza A by PCR: NEGATIVE
Influenza B by PCR: NEGATIVE
Resp Syncytial Virus by PCR: NEGATIVE
SARS Coronavirus 2 by RT PCR: NEGATIVE

## 2022-12-12 MED ORDER — SODIUM CHLORIDE 0.9 % IV BOLUS
1000.0000 mL | Freq: Once | INTRAVENOUS | Status: AC
  Administered 2022-12-12: 1000 mL via INTRAVENOUS

## 2022-12-12 NOTE — ED Triage Notes (Signed)
Pt told to come by PCP for abnormal potassium, sodium and WBC. 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 Pt wears 3L 02 per [place] at baseline. Pt c/o SOB started today when she got the call from PCP. Pt denies chest pain.

## 2022-12-12 NOTE — ED Provider Notes (Signed)
[place] EMERGENCY DEPARTMENT AT [facility] Provider Note  CSN: [id number] Arrival date & time: 12/12/22 1706  Chief Complaint(s) Abnormal Lab  HPI Kathryn Banks is a 67 y.o. female with past medical history as below, significant for COPD, hepatitis, hypertension indigestion who presents to the ED with complaint of abnormal labs.  She was recently made secondary to pneumonia.  She had outpatient labs taken earlier this week, she was called by PCPs office and advised to urgently come to the emergency department for evaluation due to concern for abnormal kidney function and potassium.  Reports she has been feeling "great" since being discharged, she has been performing her typical ADLs, she has been grocery shopping and compliant with her medications.  She is on 3 L nasal cannula chronically and reports her breathing is at baseline, no significant cough, fevers or chills.  No chest pain, abdominal pain, nausea or vomiting.  She is asymptomatic.  Past Medical History Past Medical History:  Diagnosis Date   Acid reflux    Arthritis    FINGERS   COPD (chronic obstructive pulmonary disease) (HCC)    Depression    ETOH abuse    Hepatitis    C-PT STATED SHE WAS TOLD THIS 2014   Hypertension    OFF MEDS FOR FEW YEARS   Patient Active Problem List   Diagnosis Date Noted   Tobacco abuse 12/05/2022   Acute pulmonary edema (HCC) 12/03/2022   Acute exacerbation of chronic obstructive pulmonary disease (COPD) (HCC) 12/03/2022   Acute on chronic respiratory failure with hypoxia (HCC) 12/03/2022   Acute diastolic CHF (congestive heart failure) (HCC) 12/02/2022   Community acquired pneumonia of right lower lobe of lung 12/02/2022   Acute hypoxic respiratory failure (HCC) 11/28/2022   Right lower lobe pneumonia 11/28/2022   Continuous dependence on cigarette smoking 11/28/2022   History of depression 11/28/2022   Cirrhosis (HCC) 11/28/2022   GAD (generalized anxiety disorder)  11/28/2022   Microcytic anemia 11/28/2022   Hyperlipidemia 11/28/2022   Elevated brain natriuretic peptide (BNP) level 11/28/2022   Subclinical hypothyroidism 02/01/2019   Chest pain 01/31/2019   Essential hypertension 01/31/2019   COPD with acute exacerbation (HCC) 01/31/2019   Tobacco dependence 01/31/2019   History of substance abuse (HCC) 01/31/2019   Pulmonary nodules/lesions, multiple 01/31/2019   Home Medication(s) Prior to Admission medications   Medication Sig Start Date End Date Taking? Authorizing Provider  albuterol (VENTOLIN HFA) 108 (90 Base) MCG/ACT inhaler Inhale 1 puff into the lungs every 6 (six) hours as needed for shortness of breath. 07/30/22 07/30/23  [provider]  albuterol-ipratropium (COMBIVENT) 18-103 MCG/ACT inhaler Inhale 2 puffs into the lungs every 6 (six) hours as needed for wheezing or shortness of breath. 09/01/15   Sharyn Creamer, MD  amLODipine (NORVASC) 5 MG tablet Take 1 tablet (5 mg total) by mouth daily. 02/01/19 11/28/22  Hughie Closs, MD  benzonatate (TESSALON) 100 MG capsule Take 1 capsule (100 mg total) by mouth 3 (three) times daily as needed for cough. 12/05/22   Willeen Niece, MD  diphenhydramine-acetaminophen (TYLENOL PM) 25-500 MG TABS tablet Take 1 tablet by mouth at bedtime.    [provider]  furosemide (LASIX) 40 MG tablet Take 1 tablet (40 mg total) by mouth daily. 12/06/22   Willeen Niece, MD  metoprolol tartrate (LOPRESSOR) 25 MG tablet Take 0.5 tablets (12.5 mg total) by mouth 2 (two) times daily. 02/01/19 11/28/22  Hughie Closs, MD  nicotine (NICODERM CQ - DOSED IN MG/24 HOURS)  14 mg/24hr patch Place 1 patch (14 mg total) onto the skin daily. 12/06/22   Willeen Niece, MD  nicotine polacrilex (NICORETTE) 2 MG gum Take 1 each (2 mg total) by mouth as needed for smoking cessation. 12/05/22   Willeen Niece, MD  omeprazole (PRILOSEC OTC) 20 MG tablet Take 20 mg by mouth daily.    [provider]  sertraline (ZOLOFT) 25  MG tablet Take 1 tablet (25 mg total) by mouth at bedtime. 12/05/22 01/04/23  Willeen Niece, MD  spironolactone (ALDACTONE) 25 MG tablet Take 1 tablet (25 mg total) by mouth daily. 12/06/22 01/05/23  Willeen Niece, MD  tiotropium (SPIRIVA) 18 MCG inhalation capsule Place 18 mcg into inhaler and inhale daily.    [provider]                                                                                                                                    Past Surgical History Past Surgical History:  Procedure Laterality Date   CESAREAN SECTION     OPEN REDUCTION INTERNAL FIXATION (ORIF) DISTAL RADIAL FRACTURE Right 10/15/2017   Procedure: OPEN REDUCTION INTERNAL FIXATION (ORIF) DISTAL RADIAL FRACTURE;  Surgeon: Kennedy Bucker, MD;  Location: [facility] ORS;  Service: Orthopedics;  Laterality: Right;   Family History Family History  Problem Relation Age of Onset   Ulcers Mother        Recent bleeding ulcers requiring transfusion, 2020   Hypertension Father    CAD Father 82   CVA Sister 4   Breast cancer Neg Hx     Social History Social History   Tobacco Use   Smoking status: Every Day    Current packs/day: 1.00    Average packs/day: 1 pack/day for 45.0 years (45.0 ttl pk-yrs)    Types: Cigarettes   Smokeless tobacco: Never  Vaping Use   Vaping status: Never Used  Substance Use Topics   Alcohol use: Yes    Comment: OCC -- beer and vodka; 1-2 every few days   Drug use: Not Currently    Types: Opium, Benzodiazepines, Heroin    Comment: remote history, quit about 1985   Allergies Patient has no known allergies.  Review of Systems Review of Systems  Constitutional:  Negative for chills and fever.  Respiratory:  Negative for chest tightness and shortness of breath.   Gastrointestinal:  Negative for abdominal pain, nausea and vomiting.  Genitourinary:  Negative for difficulty urinating and dysuria.  Musculoskeletal:  Negative for arthralgias and joint swelling.  Skin:   Negative for color change.  Neurological:  Negative for syncope and weakness.  All other systems reviewed and are negative.   Physical Exam Vital Signs  I have reviewed the triage vital signs BP (!) 139/122   Pulse 79   Temp 98.8 F (37.1 C) (Oral)   Resp 17   Ht 5\' 4"  (1.626 m)   Wt 61.4 kg   LMP 09/25/2005 (  Approximate)   SpO2 94%   BMI 23.23 kg/m  Physical Exam Vitals and nursing note reviewed.  Constitutional:      General: She is not in acute distress.    Appearance: Normal appearance.  HENT:     Head: Normocephalic and atraumatic.     Right Ear: External ear normal.     Left Ear: External ear normal.     Nose: Nose normal.     Mouth/Throat:     Mouth: Mucous membranes are moist.  Eyes:     General: No scleral icterus.       Right eye: No discharge.        Left eye: No discharge.  Cardiovascular:     Rate and Rhythm: Normal rate and regular rhythm.     Pulses: Normal pulses.     Heart sounds: Normal heart sounds.  Pulmonary:     Effort: Pulmonary effort is normal. No respiratory distress.     Breath sounds: Normal breath sounds. No stridor.  Abdominal:     General: Abdomen is flat. There is no distension.     Palpations: Abdomen is soft.     Tenderness: There is no abdominal tenderness.  Musculoskeletal:     Cervical back: No rigidity.     Right lower leg: No edema.     Left lower leg: No edema.  Skin:    General: Skin is warm and dry.     Capillary Refill: Capillary refill takes less than 2 seconds.  Neurological:     Mental Status: She is alert.  Psychiatric:        Mood and Affect: Mood normal.        Behavior: Behavior normal. Behavior is cooperative.     ED Results and Treatments Labs (all labs ordered are listed, but only abnormal results are displayed) Labs Reviewed  COMPREHENSIVE METABOLIC PANEL - Abnormal; Notable for the following components:      Result Value   Sodium 134 (*)    Chloride 94 (*)    CO2 21 (*)    Creatinine, Ser 1.08  (*)    AST 76 (*)    ALT 89 (*)    GFR, Estimated 57 (*)    Anion gap 19 (*)    All other components within normal limits  CBC - Abnormal; Notable for the following components:   WBC 12.5 (*)    Hemoglobin 10.5 (*)    HCT 35.0 (*)    MCV 79.4 (*)    MCH 23.8 (*)    RDW 25.2 (*)    Platelets 521 (*)    All other components within normal limits  RESP PANEL BY RT-PCR (RSV, FLU A&B, COVID)  RVPGX2                                                                                                                          Radiology DG Chest 1 View  Result Date: 12/12/2022 CLINICAL DATA:  Chest pain  EXAM: CHEST  1 VIEW COMPARISON:  12/02/2022, CT 11/28/2022, 01/31/2019 FINDINGS: Mild diffuse increased interstitial and ground-glass opacity, suspect secondary to chronic lung disease. No consolidation, pleural effusion, or pneumothorax. Stable cardiomediastinal silhouette with aortic atherosclerosis IMPRESSION: Mild diffuse increased interstitial and ground-glass opacity, suspect secondary to chronic lung disease. No definite acute airspace disease Electronically Signed   By: Jasmine Pang M.D.   On: 12/12/2022 19:54    Pertinent labs & imaging results that were available during my care of the patient were reviewed by me and considered in my medical decision making (see MDM for details).  Medications Ordered in ED Medications  sodium chloride 0.9 % bolus 1,000 mL (1,000 mLs Intravenous Bolus 12/12/22 2051)                                                                                                                                     Procedures Procedures  (including critical care time)  Medical Decision Making / ED Course    Medical Decision Making:    Kathryn Banks is a 67 y.o. female with past medical history as below, significant for COPD, hepatitis, hypertension indigestion who presents to the ED with complaint of abnormal labs.. The complaint involves an extensive differential  diagnosis and also carries with it a high risk of complications and morbidity.  Serious etiology was considered. Ddx includes but is not limited to: dehydration, metabolic derangement, lab error, etc  Complete initial physical exam performed, notably the patient  was NAD, asymptomatic, breathing comfortably on her home 3 L.    Reviewed and confirmed nursing documentation for past medical history, family history, social history.  Vital signs reviewed.    Clinical Course as of 12/12/22 2123  Fri Dec 12, 2022  2117 She is asymptomatic  [SG]    Clinical Course User Index [SG] Sloan Leiter, DO     Patient was sent here for abnormal labs.  Labs repeated today, kidney function is similar to her baseline, potassium is normal.  She has slight leukocytosis, she is afebrile, no hypoxia or tachycardia.  My suspicion for infection is low at this point. CXR stable.   She was given IV fluids, she is tolerant p.o. intake with difficulty.  Recommend she follow-up with her PCP in the next week for recheck of her labs and for ongoing follow-up  The patient improved significantly and was discharged in stable condition. Detailed discussions were had with the patient regarding current findings, and need for close f/u with PCP or on call doctor. The patient has been instructed to return immediately if the symptoms worsen in any way for re-evaluation. Patient verbalized understanding and is in agreement with current care plan. All questions answered prior to discharge.                  Additional history obtained: -Additional history obtained from NA -External records from outside source obtained and reviewed  including: Chart review including previous notes, labs, imaging, consultation notes including  Recent admission, home meds, prior labs    Lab Tests: -I ordered, reviewed, and interpreted labs.   The pertinent results include:   Labs Reviewed  COMPREHENSIVE METABOLIC PANEL - Abnormal;  Notable for the following components:      Result Value   Sodium 134 (*)    Chloride 94 (*)    CO2 21 (*)    Creatinine, Ser 1.08 (*)    AST 76 (*)    ALT 89 (*)    GFR, Estimated 57 (*)    Anion gap 19 (*)    All other components within normal limits  CBC - Abnormal; Notable for the following components:   WBC 12.5 (*)    Hemoglobin 10.5 (*)    HCT 35.0 (*)    MCV 79.4 (*)    MCH 23.8 (*)    RDW 25.2 (*)    Platelets 521 (*)    All other components within normal limits  RESP PANEL BY RT-PCR (RSV, FLU A&B, COVID)  [id number]    Notable for labs stable  EKG   EKG Interpretation Date/Time:    Ventricular Rate:    PR Interval:    QRS Duration:    QT Interval:    QTC Calculation:   R Axis:      Text Interpretation:           Imaging Studies ordered: I ordered imaging studies including CXR I independently visualized the following imaging with scope of interpretation limited to determining acute life threatening conditions related to emergency care; findings noted above, significant for stable imaging  I independently visualized and interpreted imaging. I agree with the radiologist interpretation   Medicines ordered and prescription drug management: Meds ordered this encounter  Medications   sodium chloride 0.9 % bolus 1,000 mL    -I have reviewed the patients home medicines and have made adjustments as needed   Consultations Obtained: na   Cardiac Monitoring: Continuous pulse oximetry interpreted by myself, 95% on 3LNC.    Social Determinants of Health:  Diagnosis or treatment significantly limited by social determinants of health: current smoker Smoking cessation encouraged   Reevaluation: After the interventions noted above, I reevaluated the patient and found that they have resolved  Co morbidities that complicate the patient evaluation  Past Medical History:  Diagnosis Date   Acid reflux    Arthritis    FINGERS   COPD (chronic obstructive  pulmonary disease) (HCC)    Depression    ETOH abuse    Hepatitis    C-PT STATED SHE WAS TOLD THIS 2014   Hypertension    OFF MEDS FOR FEW YEARS      Dispostion: Disposition decision including need for hospitalization was considered, and patient discharged from emergency department.    Final Clinical Impression(s) / ED Diagnoses Final diagnoses:  Abnormal laboratory test        Sloan Leiter, DO 12/12/22 2123

## 2022-12-12 NOTE — Discharge Instructions (Signed)
It was a pleasure caring for you today in the emergency department.  Please return to the emergency department for any worsening or worrisome symptoms.  Please follow-up with your primary care provider for reevaluation in the next week, repeat lab work

## 2023-01-02 ENCOUNTER — Inpatient Hospital Stay: Payer: Medicare Other | Admitting: Pulmonary Disease

## 2023-01-14 ENCOUNTER — Encounter: Payer: Self-pay | Admitting: Oncology

## 2023-01-14 ENCOUNTER — Inpatient Hospital Stay: Payer: Medicare Other

## 2023-01-14 ENCOUNTER — Inpatient Hospital Stay: Payer: Medicare Other | Attending: Oncology | Admitting: Oncology

## 2023-01-14 VITALS — BP 118/81 | HR 72 | Temp 97.0°F | Resp 18 | Wt 133.4 lb

## 2023-01-14 DIAGNOSIS — R778 Other specified abnormalities of plasma proteins: Secondary | ICD-10-CM | POA: Diagnosis not present

## 2023-01-14 DIAGNOSIS — K7460 Unspecified cirrhosis of liver: Secondary | ICD-10-CM | POA: Diagnosis not present

## 2023-01-14 DIAGNOSIS — F17210 Nicotine dependence, cigarettes, uncomplicated: Secondary | ICD-10-CM | POA: Diagnosis not present

## 2023-01-14 DIAGNOSIS — D75839 Thrombocytosis, unspecified: Secondary | ICD-10-CM | POA: Insufficient documentation

## 2023-01-14 DIAGNOSIS — Z79899 Other long term (current) drug therapy: Secondary | ICD-10-CM | POA: Diagnosis not present

## 2023-01-14 LAB — CBC WITH DIFFERENTIAL/PLATELET
Abs Immature Granulocytes: 0.03 10*3/uL (ref 0.00–0.07)
Basophils Absolute: 0 10*3/uL (ref 0.0–0.1)
Basophils Relative: 1 %
Eosinophils Absolute: 0.1 10*3/uL (ref 0.0–0.5)
Eosinophils Relative: 1 %
HCT: 37.8 % (ref 36.0–46.0)
Hemoglobin: 11.8 g/dL — ABNORMAL LOW (ref 12.0–15.0)
Immature Granulocytes: 0 %
Lymphocytes Relative: 24 %
Lymphs Abs: 2 10*3/uL (ref 0.7–4.0)
MCH: 25.4 pg — ABNORMAL LOW (ref 26.0–34.0)
MCHC: 31.2 g/dL (ref 30.0–36.0)
MCV: 81.3 fL (ref 80.0–100.0)
Monocytes Absolute: 0.7 10*3/uL (ref 0.1–1.0)
Monocytes Relative: 8 %
Neutro Abs: 5.4 10*3/uL (ref 1.7–7.7)
Neutrophils Relative %: 66 %
Platelets: 507 10*3/uL — ABNORMAL HIGH (ref 150–400)
RBC: 4.65 MIL/uL (ref 3.87–5.11)
RDW: 20.1 % — ABNORMAL HIGH (ref 11.5–15.5)
WBC: 8.3 10*3/uL (ref 4.0–10.5)
nRBC: 0 % (ref 0.0–0.2)

## 2023-01-14 NOTE — Assessment & Plan Note (Signed)
Likely reactive.  Will check iron, TIBC ferritin.

## 2023-01-14 NOTE — Assessment & Plan Note (Addendum)
Available labs are reviewed and discussed with patient. 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 Check CBC, multiple myeloma panel, light chain ratio, 24-hour urine protein electrophoresis.

## 2023-01-14 NOTE — Progress Notes (Addendum)
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 Check CBC, multiple myeloma panel, light chain ratio, 24-hour urine protein electrophoresis.  Thrombocytosis Likely reactive.  Will check iron, TIBC ferritin.   Orders Placed This Encounter  Procedures   CBC with Differential/Platelet    Standing Status:   Future    Number of Occurrences:   1    Standing Expiration Date:   01/14/2024   Multiple Myeloma Panel (SPEP&IFE w/QIG)    Standing Status:   Future    Number of Occurrences:   1    Standing Expiration Date:   01/14/2024   Kappa/lambda light chains    Standing Status:   Future    Number of Occurrences:   1    Standing Expiration Date:   01/14/2024   IFE+PROTEIN ELECTRO, 24-HR UR    Standing Status:   Future    Standing Expiration Date:   01/14/2024   Follow-up as needed.  If above workup is negative.  I think she does not need to routinely follow-up with hematology. All questions were answered. The patient knows to call the clinic with any problems, questions or concerns.  Rickard Patience, MD, PhD [facility] Hematology Oncology 01/14/2023   HISTORY OF PRESENTING ILLNESS:   Kathryn Banks is a  67 y.o.  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  Additional workup was obtained. 12/23/2022, serum protein electrophoresis showed mild asymmetry is present in the gamma region.   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  Patient has a history of alcohol use and she quitted 2 months ago. She was found to have liver cirrhosis. Patient has CHF for which she follows up with cardiology. Patient has a history of chronic hyponatremia.  Was felt to be secondary to Lasix use.  MEDICAL HISTORY:  Past Medical History:  Diagnosis Date   Acid reflux    Arthritis    FINGERS   COPD (chronic obstructive pulmonary disease) (HCC)    Depression    ETOH abuse    Hepatitis    C-PT STATED SHE WAS TOLD THIS 2014   Hypertension    OFF MEDS FOR FEW YEARS    SURGICAL HISTORY: Past Surgical History:  Procedure Laterality Date   CESAREAN SECTION     OPEN REDUCTION INTERNAL FIXATION (ORIF) DISTAL RADIAL FRACTURE Right 10/15/2017   Procedure: OPEN REDUCTION INTERNAL FIXATION (ORIF) DISTAL RADIAL FRACTURE;  Surgeon: Kennedy Bucker, MD;  Location: [facility] ORS;  Service: Orthopedics;  Laterality: Right;    SOCIAL HISTORY: Social History   Socioeconomic History   Marital status: Married    Spouse name: Not on file   Number of children: Not on file   Years of education: Not on file   Highest education level: Not on file  Occupational History   Occupation: [employer]  Tobacco Use   Smoking status: Every Day    Current packs/day: 0.50    Average packs/day: 1 pack/day for 45.1 years (45.1 ttl pk-yrs)  Types: Cigarettes    Start date: 11/28/2022   Smokeless tobacco: Never  Vaping Use   Vaping status: Never Used  Substance and Sexual Activity   Alcohol use: Not Currently    Comment: OCC -- beer and vodka; 1-2 every few days; Off of alcohol for 2 months   Drug use: Not Currently    Types: Opium, Benzodiazepines, Heroin    Comment: remote history, quit about 1985   Sexual activity: Yes  Other Topics Concern   Not on file  Social History Narrative   Not on file   Social Determinants of Health   Financial Resource Strain: Not on file   Food Insecurity: No Food Insecurity (11/28/2022)   Hunger Vital Sign    Worried About Running Out of Food in the Last Year: Never true    Ran Out of Food in the Last Year: Never true  Transportation Needs: No Transportation Needs (11/28/2022)   PRAPARE - Administrator, Civil Service (Medical): No    Lack of Transportation (Non-Medical): No  Physical Activity: Not on file  Stress: Not on file  Social Connections: Not on file  Intimate Partner Violence: Not At Risk (11/28/2022)   Humiliation, Afraid, Rape, and Kick questionnaire    Fear of Current or Ex-Partner: No    Emotionally Abused: No    Physically Abused: No    Sexually Abused: No    FAMILY HISTORY: Family History  Problem Relation Age of Onset   Hypertension Mother    Ulcers Mother        Recent bleeding ulcers requiring transfusion, 2020   Cancer Father    Hypertension Father    CAD Father 33   CVA Sister 70   Breast cancer Neg Hx     ALLERGIES:  has No Known Allergies.  MEDICATIONS:  Current Outpatient Medications  Medication Sig Dispense Refill   albuterol (VENTOLIN HFA) 108 (90 Base) MCG/ACT inhaler Inhale 1 puff into the lungs every 6 (six) hours as needed for shortness of breath.     albuterol-ipratropium (COMBIVENT) 18-103 MCG/ACT inhaler Inhale 2 puffs into the lungs every 6 (six) hours as needed for wheezing or shortness of breath. 1 Inhaler 0   amLODipine (NORVASC) 5 MG tablet Take 1 tablet (5 mg total) by mouth daily. 30 tablet 0   baclofen (LIORESAL) 10 MG tablet Take 1 tablet by mouth 3 (three) times daily as needed.     benzonatate (TESSALON) 100 MG capsule Take 1 capsule (100 mg total) by mouth 3 (three) times daily as needed for cough. 20 capsule 0   cyanocobalamin (VITAMIN B12) 1000 MCG tablet Take 1,000 mcg by mouth daily.     diphenhydramine-acetaminophen (TYLENOL PM) 25-500 MG TABS tablet Take 1 tablet by mouth at bedtime.     furosemide (LASIX) 40 MG tablet Take 1 tablet (40 mg total)  by mouth daily. 30 tablet 0   MAGNESIUM OXIDE 400 PO Take by mouth.     metoprolol tartrate (LOPRESSOR) 25 MG tablet Take 0.5 tablets (12.5 mg total) by mouth 2 (two) times daily. 30 tablet 0   nicotine (NICODERM CQ - DOSED IN MG/24 HOURS) 14 mg/24hr patch Place 1 patch (14 mg total) onto the skin daily. 28 patch 1   omeprazole (PRILOSEC OTC) 20 MG tablet Take 20 mg by mouth daily.     sertraline (ZOLOFT) 25 MG tablet Take 1 tablet (25 mg total) by mouth at bedtime. 30 tablet 0   spironolactone (ALDACTONE) 25 MG tablet  Take 1 tablet (25 mg total) by mouth daily. 30 tablet 0   tiotropium (SPIRIVA) 18 MCG inhalation capsule Place 18 mcg into inhaler and inhale daily.     No current facility-administered medications for this visit.    Review of Systems  Constitutional:  Negative for appetite change, chills, fatigue and fever.  HENT:   Negative for hearing loss and voice change.   Eyes:  Negative for eye problems.  Respiratory:  Negative for chest tightness and cough.   Cardiovascular:  Negative for chest pain.  Gastrointestinal:  Negative for abdominal distention, abdominal pain and blood in stool.  Endocrine: Negative for hot flashes.  Genitourinary:  Negative for difficulty urinating and frequency.   Musculoskeletal:  Negative for arthralgias.  Skin:  Negative for itching and rash.  Neurological:  Negative for extremity weakness.  Hematological:  Negative for adenopathy.  Psychiatric/Behavioral:  Negative for confusion.    PHYSICAL EXAMINATION: ECOG PERFORMANCE STATUS: 0 - Asymptomatic Vitals:   01/14/23 0955  BP: 118/81  Pulse: 72  Resp: 18  Temp: (!) 97 F (36.1 C)  SpO2: 98%   Filed Weights   01/14/23 0955  Weight: 133 lb 6.4 oz (60.5 kg)    Physical Exam Constitutional:      General: She is not in acute distress. HENT:     Head: Normocephalic and atraumatic.  Eyes:     General: No scleral icterus. Cardiovascular:     Rate and Rhythm: Normal rate and regular  rhythm.     Heart sounds: Normal heart sounds.  Pulmonary:     Effort: Pulmonary effort is normal. No respiratory distress.     Breath sounds: No wheezing.  Abdominal:     General: Bowel sounds are normal. There is no distension.     Palpations: Abdomen is soft.  Musculoskeletal:        General: No deformity. Normal range of motion.     Cervical back: Normal range of motion and neck supple.  Skin:    General: Skin is warm and dry.     Findings: No erythema or rash.  Neurological:     Mental Status: She is alert and oriented to person, place, and time. Mental status is at baseline.     Cranial Nerves: No cranial nerve deficit.  Psychiatric:        Mood and Affect: Mood normal.     LABORATORY DATA:  I have reviewed the data as listed    Latest Ref Rng & Units 01/14/2023   10:35 AM 12/12/2022    5:36 PM 12/05/2022    3:25 AM  CBC  WBC 4.0 - 10.5 K/uL 8.3  12.5  10.4   Hemoglobin 12.0 - 15.0 g/dL 95.6  21.3  9.3   Hematocrit 36.0 - 46.0 % 37.8  35.0  31.2   Platelets 150 - 400 K/uL 507  521  438       Latest Ref Rng & Units 12/12/2022    5:36 PM 12/04/2022    3:00 AM 12/03/2022    3:23 AM  CMP  Glucose 70 - 99 mg/dL 97  94  086   BUN 8 - 23 mg/dL 14  22  19    Creatinine 0.44 - 1.00 mg/dL 5.78  4.69  6.29   Sodium 135 - 145 mmol/L 134  134  132   Potassium 3.5 - 5.1 mmol/L 4.5  3.9  4.7   Chloride 98 - 111 mmol/L 94  99  99   CO2 22 -  32 mmol/L 21  25  27    Calcium 8.9 - 10.3 mg/dL 9.2  8.9  9.1   Total Protein 6.5 - 8.1 g/dL 7.8   8.5   Total Bilirubin 0.3 - 1.2 mg/dL 0.7   0.7   Alkaline Phos 38 - 126 U/L 110   150   AST 15 - 41 U/L 76   88   ALT 0 - 44 U/L 89   49       RADIOGRAPHIC STUDIES: I have personally reviewed the radiological images as listed and agreed with the findings in the report. DG Chest 1 View  Result Date: 12/12/2022 CLINICAL DATA:  Chest pain EXAM: CHEST  1 VIEW COMPARISON:  12/02/2022, CT 11/28/2022, 01/31/2019 FINDINGS: Mild diffuse increased  interstitial and ground-glass opacity, suspect secondary to chronic lung disease. No consolidation, pleural effusion, or pneumothorax. Stable cardiomediastinal silhouette with aortic atherosclerosis IMPRESSION: Mild diffuse increased interstitial and ground-glass opacity, suspect secondary to chronic lung disease. No definite acute airspace disease Electronically Signed   By: Jasmine Pang M.D.   On: 12/12/2022 19:54   DG Chest Port 1 View  Result Date: 12/02/2022 CLINICAL DATA:  Hypoxia EXAM: PORTABLE CHEST 1 VIEW COMPARISON:  11/28/2022 FINDINGS: Heart size and pulmonary vascularity are normal for technique. Emphysematous changes in the lungs with central interstitial markings. No focal consolidation is identified. No pleural effusions. No pneumothorax. Mediastinal contours appear intact. No pleural effusions. No pneumothorax. Mediastinal contours appear intact. Degenerative changes in the spine. IMPRESSION: Diffuse interstitial markings to the lungs may represent fibrosis or edema. Multifocal pneumonia could also be considered in the appropriate clinical setting. Electronically Signed   By: Burman Nieves M.D.   On: 12/02/2022 15:48   ECHOCARDIOGRAM COMPLETE  Result Date: 11/29/2022    ECHOCARDIOGRAM REPORT   Patient Name:   LANAYSIA ASHABRANNER Date of Exam: 11/29/2022 Medical Rec #:  [id number]   Height:       64.0 in Accession #:    [id number]  Weight:       144.8 lb Date of Birth:  1955/07/15    BSA:          1.706 m Patient Age:    66 years    BP:           133/80 mmHg Patient Gender: F           HR:           84 bpm. Exam Location:  Inpatient Procedure: 2D Echo, Cardiac Doppler and Color Doppler Indications:    Other cardiac sounds R01.2  History:        Patient has no prior history of Echocardiogram examinations.                 COPD; Risk Factors:Hypertension, Dyslipidemia and Current                 Smoker. History of substance and ETOH abuse (HCC).  Sonographer:    Celesta Gentile RCS Referring Phys: [id number]  SUBRINA SUNDIL IMPRESSIONS  1. Left ventricular ejection fraction, by estimation, is 65 to 70%. The left ventricle has normal function. The left ventricle has no regional wall motion abnormalities. There is mild left ventricular hypertrophy. Left ventricular diastolic parameters are consistent with Grade I diastolic dysfunction (impaired relaxation).  2. Right ventricular systolic function is normal. The right ventricular size is normal.  3. Left atrial size was severely dilated.  4. Right atrial size was severely dilated.  5. The mitral valve is normal  in structure. No evidence of mitral valve regurgitation. No evidence of mitral stenosis.  6. The aortic valve is normal in structure. Aortic valve regurgitation is not visualized. No aortic stenosis is present.  7. The inferior vena cava is normal in size with greater than 50% respiratory variability, suggesting right atrial pressure of 3 mmHg. FINDINGS  Left Ventricle: Left ventricular ejection fraction, by estimation, is 65 to 70%. The left ventricle has normal function. The left ventricle has no regional wall motion abnormalities. The left ventricular internal cavity size was normal in size. There is  mild left ventricular hypertrophy. Left ventricular diastolic parameters are consistent with Grade I diastolic dysfunction (impaired relaxation). Right Ventricle: The right ventricular size is normal. No increase in right ventricular wall thickness. Right ventricular systolic function is normal. Left Atrium: Left atrial size was severely dilated. Right Atrium: Right atrial size was severely dilated. Pericardium: There is no evidence of pericardial effusion. Mitral Valve: The mitral valve is normal in structure. No evidence of mitral valve regurgitation. No evidence of mitral valve stenosis. Tricuspid Valve: The tricuspid valve is normal in structure. Tricuspid valve regurgitation is not demonstrated. No evidence of tricuspid stenosis. Aortic Valve: The aortic valve  is normal in structure. Aortic valve regurgitation is not visualized. No aortic stenosis is present. Aortic valve mean gradient measures 8.0 mmHg. Aortic valve peak gradient measures 16.6 mmHg. Aortic valve area, by VTI measures 1.81 cm. Pulmonic Valve: The pulmonic valve was normal in structure. Pulmonic valve regurgitation is not visualized. No evidence of pulmonic stenosis. Aorta: The aortic root is normal in size and structure. Venous: The inferior vena cava is normal in size with greater than 50% respiratory variability, suggesting right atrial pressure of 3 mmHg. IAS/Shunts: No atrial level shunt detected by color flow Doppler.  LEFT VENTRICLE PLAX 2D LVIDd:         5.40 cm   Diastology LVIDs:         3.20 cm   LV e' medial:    8.81 cm/s LV PW:         1.20 cm   LV E/e' medial:  10.9 LV IVS:        0.90 cm   LV e' lateral:   10.20 cm/s LVOT diam:     1.85 cm   LV E/e' lateral: 9.5 LV SV:         69 LV SV Index:   41 LVOT Area:     2.69 cm  RIGHT VENTRICLE RV S prime:     21.80 cm/s TAPSE (M-mode): 2.7 cm LEFT ATRIUM              Index        RIGHT ATRIUM           Index LA diam:        3.90 cm  2.29 cm/m   RA Area:     23.60 cm LA Vol (A2C):   99.1 ml  58.10 ml/m  RA Volume:   73.00 ml  42.80 ml/m LA Vol (A4C):   120.0 ml 70.35 ml/m LA Biplane Vol: 117.0 ml 68.59 ml/m  AORTIC VALVE AV Area (Vmax):    1.86 cm AV Area (Vmean):   2.03 cm AV Area (VTI):     1.81 cm AV Vmax:           204.00 cm/s AV Vmean:          127.000 cm/s AV VTI:  0.383 m AV Peak Grad:      16.6 mmHg AV Mean Grad:      8.0 mmHg LVOT Vmax:         141.00 cm/s LVOT Vmean:        96.000 cm/s LVOT VTI:          0.258 m LVOT/AV VTI ratio: 0.67  AORTA Ao Root diam: 3.40 cm MITRAL VALVE MV Area (PHT): 4.06 cm    SHUNTS MV Decel Time: 187 msec    Systemic VTI:  0.26 m MV E velocity: 96.40 cm/s  Systemic Diam: 1.85 cm MV A velocity: 93.00 cm/s MV E/A ratio:  1.04 Donato Schultz MD Electronically signed by Donato Schultz MD Signature  Date/Time: 11/29/2022/4:16:23 PM    Final    US ABDOMEN LIMITED WITH LIVER DOPPLER  Result Date: 11/28/2022 CLINICAL DATA:  Cirrhosis EXAM: DUPLEX ULTRASOUND OF LIVER TECHNIQUE: Color and duplex Doppler ultrasound was performed to evaluate the hepatic in-flow and out-flow vessels. COMPARISON:  11/28/2022 FINDINGS: Liver: Normal parenchymal echogenicity. Nodularity of the liver capsule as identified on earlier CT, consistent with cirrhosis. No focal lesion, mass or intrahepatic biliary ductal dilatation. Gallbladder: No evidence of cholelithiasis or cholecystitis. Common bile duct: 8 mm. Main Portal Vein size: 1.4 cm Portal Vein Velocities Main Prox:  32.5 cm/sec Main Mid: 20.4 cm/sec Main Dist:  28.1 cm/sec Right: 22.2 cm/sec Left: 19.5 cm/sec Normal directional flow within the portal vein. Hepatic Vein Velocities Right:  7.0 cm/sec Middle:  60.5 cm/sec Left:  50.8 cm/sec Normal directional flow within the hepatic veins. IVC: Present and patent with normal respiratory phasicity. Hepatic Artery Velocity:  92.5 cm/sec Splenic Vein Velocity:  15.4 cm/sec Spleen: 10.8 cm x 5.9 cm x 6.2 cm with a total volume of 206 cm^3 (411 cm^3 is upper limit normal) Portal Vein Occlusion/Thrombus: No Splenic Vein Occlusion/Thrombus: No Ascites: None Varices: None IMPRESSION: 1. Nodular contour of the liver consistent with cirrhosis. No focal liver abnormality. 2. Unremarkable Doppler evaluation. Normal directional flow within the portal vein. Electronically Signed   By: Sharlet Salina M.D.   On: 11/28/2022 23:02   CT Angio Chest PE W and/or Wo Contrast  Result Date: 11/28/2022 CLINICAL DATA:  Shortness of breath.  PE suspected EXAM: CT ANGIOGRAPHY CHEST WITH CONTRAST TECHNIQUE: Multidetector CT imaging of the chest was performed using the standard protocol during bolus administration of intravenous contrast. Multiplanar CT image reconstructions and MIPs were obtained to evaluate the vascular anatomy. RADIATION DOSE REDUCTION:  This exam was performed according to the departmental dose-optimization program which includes automated exposure control, adjustment of the mA and/or kV according to patient size and/or use of iterative reconstruction technique. CONTRAST:  75mL OMNIPAQUE IOHEXOL 350 MG/ML SOLN COMPARISON:  Chest radiograph 11/28/2022 and CTA chest 01/31/2019 FINDINGS: Cardiovascular: Negative for acute pulmonary embolism. Normal caliber thoracic aorta. No pericardial effusion. Coronary artery and aortic atherosclerotic calcification. Mediastinum/Nodes: Layering debris in the trachea. Unremarkable esophagus. Prominent mediastinal and hilar nodes. For example 11 mm right hilar node on 06/183. Lungs/Pleura: Layering debris in the right mainstem bronchus. Additional mucous plugging/debris in the right lower lobe bronchi. Patchy ground-glass and consolidative opacities with interlobular septal thickening bilaterally greatest in the right lower lobe. No pleural effusion or pneumothorax. Mosaic attenuation compatible with air trapping. Biapical pleural-parenchymal scarring. Multiple predominantly subpleural pulmonary nodules measuring up to 6 mm are similar to 2020. No follow-up recommended. Upper Abdomen: Nodular contour of the liver compatible with cirrhosis. No acute abnormality. Musculoskeletal: No acute fracture. Review of  the MIP images confirms the above findings. IMPRESSION: 1. Negative for acute pulmonary embolism. 2. Right lower lobe bronchopneumonia. 3. Cirrhosis. Aortic Atherosclerosis (ICD10-I70.0). Electronically Signed   By: Minerva Fester M.D.   On: 11/28/2022 20:33   DG Chest 2 View  Result Date: 11/28/2022 CLINICAL DATA:  COPD. Shortness of breath with low oxygen saturations. EXAM: CHEST - 2 VIEW COMPARISON:  01/31/2019 FINDINGS: Borderline heart size with normal pulmonary vascularity. Emphysematous changes in the lungs. Hazy interstitial pattern consistent with respiratory bronchiolitis. No consolidation or edema.  No pleural effusions. No pneumothorax. Mediastinal contours appear intact. Calcified and tortuous aorta. Degenerative changes in the spine. IMPRESSION: Emphysematous and chronic bronchitic changes in the lungs. No focal consolidation. Electronically Signed   By: Burman Nieves M.D.   On: 11/28/2022 19:19

## 2023-01-15 ENCOUNTER — Other Ambulatory Visit: Payer: Self-pay

## 2023-01-15 DIAGNOSIS — D75839 Thrombocytosis, unspecified: Secondary | ICD-10-CM

## 2023-01-15 DIAGNOSIS — R778 Other specified abnormalities of plasma proteins: Secondary | ICD-10-CM | POA: Diagnosis not present

## 2023-01-15 LAB — IRON AND TIBC
Iron: 51 ug/dL (ref 28–170)
Saturation Ratios: 9 % — ABNORMAL LOW (ref 10.4–31.8)
TIBC: 561 ug/dL — ABNORMAL HIGH (ref 250–450)
UIBC: 510 ug/dL

## 2023-01-15 LAB — RETIC PANEL
Immature Retic Fract: 9.8 % (ref 2.3–15.9)
RBC.: 4.69 MIL/uL (ref 3.87–5.11)
Retic Count, Absolute: 26.7 10*3/uL (ref 19.0–186.0)
Retic Ct Pct: 0.6 % (ref 0.4–3.1)
Reticulocyte Hemoglobin: 27 pg — ABNORMAL LOW (ref >27.9)

## 2023-01-15 LAB — KAPPA/LAMBDA LIGHT CHAINS
Kappa free light chain: 98.6 mg/L — ABNORMAL HIGH (ref 3.3–19.4)
Kappa, lambda light chain ratio: 1.31 (ref 0.26–1.65)
Lambda free light chains: 75.2 mg/L — ABNORMAL HIGH (ref 5.7–26.3)

## 2023-01-15 LAB — FERRITIN: Ferritin: 13 ng/mL (ref 11–307)

## 2023-01-16 LAB — MULTIPLE MYELOMA PANEL, SERUM
Albumin SerPl Elph-Mcnc: 3.8 g/dL (ref 2.9–4.4)
Albumin/Glob SerPl: 0.8 (ref 0.7–1.7)
Alpha 1: 0.4 g/dL (ref 0.0–0.4)
Alpha2 Glob SerPl Elph-Mcnc: 1.2 g/dL — ABNORMAL HIGH (ref 0.4–1.0)
B-Globulin SerPl Elph-Mcnc: 1.2 g/dL (ref 0.7–1.3)
Gamma Glob SerPl Elph-Mcnc: 2.2 g/dL — ABNORMAL HIGH (ref 0.4–1.8)
Globulin, Total: 5 g/dL — ABNORMAL HIGH (ref 2.2–3.9)
IgA: 448 mg/dL — ABNORMAL HIGH (ref 87–352)
IgG (Immunoglobin G), Serum: 1895 mg/dL — ABNORMAL HIGH (ref 586–1602)
IgM (Immunoglobulin M), Srm: 191 mg/dL (ref 26–217)
Total Protein ELP: 8.8 g/dL — ABNORMAL HIGH (ref 6.0–8.5)

## 2023-01-27 ENCOUNTER — Encounter: Payer: Self-pay | Admitting: Internal Medicine

## 2023-02-09 ENCOUNTER — Other Ambulatory Visit: Payer: Self-pay | Admitting: Family Medicine

## 2023-02-09 DIAGNOSIS — Z1231 Encounter for screening mammogram for malignant neoplasm of breast: Secondary | ICD-10-CM

## 2023-02-10 ENCOUNTER — Inpatient Hospital Stay: Payer: Medicare Other | Admitting: Internal Medicine

## 2023-02-11 ENCOUNTER — Inpatient Hospital Stay: Payer: Medicare HMO | Admitting: Internal Medicine

## 2023-03-10 NOTE — Progress Notes (Signed)
 This encounter was created in error - please disregard.

## 2023-03-11 ENCOUNTER — Encounter: Payer: Medicare HMO | Admitting: Internal Medicine

## 2023-05-04 ENCOUNTER — Other Ambulatory Visit (INDEPENDENT_AMBULATORY_CARE_PROVIDER_SITE_OTHER): Payer: Self-pay | Admitting: Nurse Practitioner

## 2023-05-04 DIAGNOSIS — I83813 Varicose veins of bilateral lower extremities with pain: Secondary | ICD-10-CM

## 2023-05-06 ENCOUNTER — Encounter (INDEPENDENT_AMBULATORY_CARE_PROVIDER_SITE_OTHER): Payer: Medicare HMO

## 2023-05-06 ENCOUNTER — Encounter (INDEPENDENT_AMBULATORY_CARE_PROVIDER_SITE_OTHER): Payer: Medicare HMO | Admitting: Nurse Practitioner

## 2023-05-28 ENCOUNTER — Inpatient Hospital Stay: Payer: Medicare HMO | Admitting: Internal Medicine

## 2023-07-17 ENCOUNTER — Encounter

## 2023-08-13 ENCOUNTER — Encounter (INDEPENDENT_AMBULATORY_CARE_PROVIDER_SITE_OTHER): Payer: Self-pay | Admitting: Nurse Practitioner

## 2023-08-14 ENCOUNTER — Encounter (INDEPENDENT_AMBULATORY_CARE_PROVIDER_SITE_OTHER)

## 2023-08-14 ENCOUNTER — Ambulatory Visit (INDEPENDENT_AMBULATORY_CARE_PROVIDER_SITE_OTHER): Admitting: Nurse Practitioner

## 2023-08-18 ENCOUNTER — Encounter (INDEPENDENT_AMBULATORY_CARE_PROVIDER_SITE_OTHER): Payer: Self-pay

## 2023-08-29 ENCOUNTER — Encounter (HOSPITAL_COMMUNITY): Payer: Self-pay

## 2023-08-29 ENCOUNTER — Emergency Department (HOSPITAL_COMMUNITY)

## 2023-08-29 ENCOUNTER — Other Ambulatory Visit: Payer: Self-pay

## 2023-08-29 ENCOUNTER — Inpatient Hospital Stay (HOSPITAL_COMMUNITY)
Admission: EM | Admit: 2023-08-29 | Discharge: 2023-09-01 | DRG: 193 | Disposition: A | Discharge status: Home / Self Care | Attending: Internal Medicine | Admitting: Internal Medicine

## 2023-08-29 DIAGNOSIS — F419 Anxiety disorder, unspecified: Secondary | ICD-10-CM | POA: Diagnosis present

## 2023-08-29 DIAGNOSIS — I272 Pulmonary hypertension, unspecified: Secondary | ICD-10-CM | POA: Diagnosis present

## 2023-08-29 DIAGNOSIS — J180 Bronchopneumonia, unspecified organism: Secondary | ICD-10-CM | POA: Diagnosis present

## 2023-08-29 DIAGNOSIS — K7460 Unspecified cirrhosis of liver: Secondary | ICD-10-CM | POA: Diagnosis present

## 2023-08-29 DIAGNOSIS — R7989 Other specified abnormal findings of blood chemistry: Secondary | ICD-10-CM | POA: Diagnosis present

## 2023-08-29 DIAGNOSIS — F17210 Nicotine dependence, cigarettes, uncomplicated: Secondary | ICD-10-CM | POA: Diagnosis present

## 2023-08-29 DIAGNOSIS — D509 Iron deficiency anemia, unspecified: Secondary | ICD-10-CM | POA: Diagnosis present

## 2023-08-29 DIAGNOSIS — K219 Gastro-esophageal reflux disease without esophagitis: Secondary | ICD-10-CM | POA: Diagnosis present

## 2023-08-29 DIAGNOSIS — F1010 Alcohol abuse, uncomplicated: Secondary | ICD-10-CM | POA: Diagnosis present

## 2023-08-29 DIAGNOSIS — J441 Chronic obstructive pulmonary disease with (acute) exacerbation: Secondary | ICD-10-CM | POA: Diagnosis present

## 2023-08-29 DIAGNOSIS — J440 Chronic obstructive pulmonary disease with acute lower respiratory infection: Secondary | ICD-10-CM | POA: Diagnosis present

## 2023-08-29 DIAGNOSIS — Z716 Tobacco abuse counseling: Secondary | ICD-10-CM

## 2023-08-29 DIAGNOSIS — Z1152 Encounter for screening for COVID-19: Secondary | ICD-10-CM

## 2023-08-29 DIAGNOSIS — J159 Unspecified bacterial pneumonia: Secondary | ICD-10-CM | POA: Diagnosis present

## 2023-08-29 DIAGNOSIS — B1920 Unspecified viral hepatitis C without hepatic coma: Secondary | ICD-10-CM | POA: Diagnosis not present

## 2023-08-29 DIAGNOSIS — F32A Depression, unspecified: Secondary | ICD-10-CM | POA: Diagnosis present

## 2023-08-29 DIAGNOSIS — Z9981 Dependence on supplemental oxygen: Secondary | ICD-10-CM | POA: Diagnosis not present

## 2023-08-29 DIAGNOSIS — E8809 Other disorders of plasma-protein metabolism, not elsewhere classified: Secondary | ICD-10-CM | POA: Diagnosis present

## 2023-08-29 DIAGNOSIS — I700 Atherosclerosis of aorta: Secondary | ICD-10-CM | POA: Diagnosis present

## 2023-08-29 DIAGNOSIS — E871 Hypo-osmolality and hyponatremia: Secondary | ICD-10-CM | POA: Diagnosis present

## 2023-08-29 DIAGNOSIS — I10 Essential (primary) hypertension: Secondary | ICD-10-CM | POA: Diagnosis present

## 2023-08-29 DIAGNOSIS — J439 Emphysema, unspecified: Secondary | ICD-10-CM | POA: Diagnosis present

## 2023-08-29 DIAGNOSIS — R918 Other nonspecific abnormal finding of lung field: Secondary | ICD-10-CM | POA: Diagnosis not present

## 2023-08-29 DIAGNOSIS — J8410 Pulmonary fibrosis, unspecified: Secondary | ICD-10-CM | POA: Diagnosis present

## 2023-08-29 DIAGNOSIS — J9621 Acute and chronic respiratory failure with hypoxia: Principal | ICD-10-CM | POA: Diagnosis present

## 2023-08-29 DIAGNOSIS — Z823 Family history of stroke: Secondary | ICD-10-CM

## 2023-08-29 DIAGNOSIS — Z79899 Other long term (current) drug therapy: Secondary | ICD-10-CM

## 2023-08-29 DIAGNOSIS — I2489 Other forms of acute ischemic heart disease: Secondary | ICD-10-CM | POA: Diagnosis present

## 2023-08-29 DIAGNOSIS — Z8249 Family history of ischemic heart disease and other diseases of the circulatory system: Secondary | ICD-10-CM | POA: Diagnosis not present

## 2023-08-29 DIAGNOSIS — J9601 Acute respiratory failure with hypoxia: Secondary | ICD-10-CM | POA: Diagnosis not present

## 2023-08-29 LAB — COMPREHENSIVE METABOLIC PANEL WITH GFR
ALT: 22 U/L (ref 0–44)
AST: 44 U/L — ABNORMAL HIGH (ref 15–41)
Albumin: 3.3 g/dL — ABNORMAL LOW (ref 3.5–5.0)
Alkaline Phosphatase: 131 U/L — ABNORMAL HIGH (ref 38–126)
Anion gap: 12 (ref 5–15)
BUN: 15 mg/dL (ref 8–23)
CO2: 21 mmol/L — ABNORMAL LOW (ref 22–32)
Calcium: 8.3 mg/dL — ABNORMAL LOW (ref 8.9–10.3)
Chloride: 96 mmol/L — ABNORMAL LOW (ref 98–111)
Creatinine, Ser: 0.78 mg/dL (ref 0.44–1.00)
GFR, Estimated: >60 mL/min (ref >60)
Glucose, Bld: 84 mg/dL (ref 70–99)
Potassium: 3.9 mmol/L (ref 3.5–5.1)
Sodium: 129 mmol/L — ABNORMAL LOW (ref 135–145)
Total Bilirubin: 0.4 mg/dL (ref 0.0–1.2)
Total Protein: 8.2 g/dL — ABNORMAL HIGH (ref 6.5–8.1)

## 2023-08-29 LAB — CBC WITH DIFFERENTIAL/PLATELET
Abs Immature Granulocytes: 0.02 10*3/uL (ref 0.00–0.07)
Basophils Absolute: 0 10*3/uL (ref 0.0–0.1)
Basophils Relative: 0 %
Eosinophils Absolute: 0.1 10*3/uL (ref 0.0–0.5)
Eosinophils Relative: 1 %
HCT: 30.4 % — ABNORMAL LOW (ref 36.0–46.0)
Hemoglobin: 8.9 g/dL — ABNORMAL LOW (ref 12.0–15.0)
Immature Granulocytes: 0 %
Lymphocytes Relative: 27 %
Lymphs Abs: 1.6 10*3/uL (ref 0.7–4.0)
MCH: 20.5 pg — ABNORMAL LOW (ref 26.0–34.0)
MCHC: 29.3 g/dL — ABNORMAL LOW (ref 30.0–36.0)
MCV: 69.9 fL — ABNORMAL LOW (ref 80.0–100.0)
Monocytes Absolute: 0.5 10*3/uL (ref 0.1–1.0)
Monocytes Relative: 9 %
Neutro Abs: 3.6 10*3/uL (ref 1.7–7.7)
Neutrophils Relative %: 63 %
Platelets: 415 10*3/uL — ABNORMAL HIGH (ref 150–400)
RBC: 4.35 MIL/uL (ref 3.87–5.11)
RDW: 20 % — ABNORMAL HIGH (ref 11.5–15.5)
Smear Review: NORMAL
WBC: 5.7 10*3/uL (ref 4.0–10.5)
nRBC: 0 % (ref 0.0–0.2)

## 2023-08-29 LAB — RESP PANEL BY RT-PCR (RSV, FLU A&B, COVID)  RVPGX2
Influenza A by PCR: NEGATIVE
Influenza B by PCR: NEGATIVE
Resp Syncytial Virus by PCR: NEGATIVE
SARS Coronavirus 2 by RT PCR: NEGATIVE

## 2023-08-29 LAB — IRON AND TIBC
Iron: 20 ug/dL — ABNORMAL LOW (ref 28–170)
Saturation Ratios: 4 % — ABNORMAL LOW (ref 10.4–31.8)
TIBC: 538 ug/dL — ABNORMAL HIGH (ref 250–450)
UIBC: 518 ug/dL

## 2023-08-29 LAB — TROPONIN I (HIGH SENSITIVITY)
Troponin I (High Sensitivity): 60 ng/L — ABNORMAL HIGH (ref <18)
Troponin I (High Sensitivity): 68 ng/L — ABNORMAL HIGH (ref <18)

## 2023-08-29 LAB — RETICULOCYTES
Immature Retic Fract: 27.4 % — ABNORMAL HIGH (ref 2.3–15.9)
RBC.: 4.43 MIL/uL (ref 3.87–5.11)
Retic Count, Absolute: 74.4 10*3/uL (ref 19.0–186.0)
Retic Ct Pct: 1.7 % (ref 0.4–3.1)

## 2023-08-29 LAB — FOLATE: Folate: 4.3 ng/mL — ABNORMAL LOW (ref >5.9)

## 2023-08-29 LAB — CREATININE, SERUM
Creatinine, Ser: 0.83 mg/dL (ref 0.44–1.00)
GFR, Estimated: >60 mL/min (ref >60)

## 2023-08-29 LAB — BRAIN NATRIURETIC PEPTIDE: B Natriuretic Peptide: 314.3 pg/mL — ABNORMAL HIGH (ref 0.0–100.0)

## 2023-08-29 LAB — VITAMIN B12: Vitamin B-12: 878 pg/mL (ref 180–914)

## 2023-08-29 LAB — HIV ANTIBODY (ROUTINE TESTING W REFLEX): HIV Screen 4th Generation wRfx: NONREACTIVE

## 2023-08-29 LAB — FERRITIN: Ferritin: 12 ng/mL (ref 11–307)

## 2023-08-29 MED ORDER — HEPARIN SODIUM (PORCINE) 5000 UNIT/ML IJ SOLN
5000.0000 [IU] | Freq: Three times a day (TID) | INTRAMUSCULAR | Status: DC
  Administered 2023-08-29 – 2023-09-01 (×8): 5000 [IU] via SUBCUTANEOUS
  Filled 2023-08-29 (×8): qty 1

## 2023-08-29 MED ORDER — GUAIFENESIN ER 600 MG PO TB12
600.0000 mg | ORAL_TABLET | Freq: Two times a day (BID) | ORAL | Status: DC
  Administered 2023-08-29 – 2023-09-01 (×6): 600 mg via ORAL
  Filled 2023-08-29 (×6): qty 1

## 2023-08-29 MED ORDER — DIPHENHYDRAMINE-APAP (SLEEP) 25-500 MG PO TABS: 1.0000 | ORAL_TABLET | Freq: Every day | ORAL | Status: DC

## 2023-08-29 MED ORDER — NICOTINE 14 MG/24HR TD PT24: 14.0000 mg | MEDICATED_PATCH | Freq: Every day | TRANSDERMAL | Status: DC

## 2023-08-29 MED ORDER — PANTOPRAZOLE SODIUM 40 MG PO TBEC
40.0000 mg | DELAYED_RELEASE_TABLET | Freq: Every day | ORAL | Status: DC
  Administered 2023-08-30 – 2023-09-01 (×3): 40 mg via ORAL
  Filled 2023-08-29 (×3): qty 1

## 2023-08-29 MED ORDER — TIOTROPIUM BROMIDE MONOHYDRATE 18 MCG IN CAPS: 18.0000 ug | ORAL_CAPSULE | Freq: Every day | RESPIRATORY_TRACT | Status: DC

## 2023-08-29 MED ORDER — AMLODIPINE BESYLATE 5 MG PO TABS
5.0000 mg | ORAL_TABLET | Freq: Every day | ORAL | Status: DC
  Administered 2023-08-30 – 2023-08-31 (×2): 5 mg via ORAL
  Filled 2023-08-29 (×3): qty 1

## 2023-08-29 MED ORDER — PREDNISONE 10 MG PO TABS
40.0000 mg | ORAL_TABLET | Freq: Every day | ORAL | Status: DC
Start: 2023-09-02 — End: 2023-09-06

## 2023-08-29 MED ORDER — BUDESON-GLYCOPYRROL-FORMOTEROL 160-9-4.8 MCG/ACT IN AERO
2.0000 | INHALATION_SPRAY | Freq: Two times a day (BID) | RESPIRATORY_TRACT | Status: DC
  Filled 2023-08-29 (×2): qty 5.9

## 2023-08-29 MED ORDER — SODIUM CHLORIDE 0.9 % IV SOLN
1.0000 g | INTRAVENOUS | Status: DC
  Administered 2023-08-29 – 2023-08-31 (×3): 1 g via INTRAVENOUS
  Filled 2023-08-29 (×3): qty 10

## 2023-08-29 MED ORDER — ALBUTEROL SULFATE (2.5 MG/3ML) 0.083% IN NEBU: 2.5000 mg | INHALATION_SOLUTION | RESPIRATORY_TRACT | Status: DC | PRN

## 2023-08-29 MED ORDER — HYDRALAZINE HCL 20 MG/ML IJ SOLN
5.0000 mg | Freq: Four times a day (QID) | INTRAMUSCULAR | Status: DC | PRN
Start: 2023-08-29 — End: 2023-09-02

## 2023-08-29 MED ORDER — SPIRONOLACTONE 25 MG PO TABS
25.0000 mg | ORAL_TABLET | Freq: Every day | ORAL | Status: DC
  Administered 2023-08-29 – 2023-09-01 (×4): 25 mg via ORAL
  Filled 2023-08-29 (×4): qty 1

## 2023-08-29 MED ORDER — DIPHENHYDRAMINE HCL 25 MG PO CAPS
25.0000 mg | ORAL_CAPSULE | Freq: Every day | ORAL | Status: DC
  Administered 2023-08-29 – 2023-08-31 (×3): 25 mg via ORAL
  Filled 2023-08-29 (×3): qty 1

## 2023-08-29 MED ORDER — ONDANSETRON HCL 4 MG/2ML IJ SOLN: 4.0000 mg | Freq: Four times a day (QID) | INTRAMUSCULAR | Status: DC | PRN

## 2023-08-29 MED ORDER — ACETAMINOPHEN 500 MG PO TABS
500.0000 mg | ORAL_TABLET | Freq: Every day | ORAL | Status: DC
  Administered 2023-08-29 – 2023-08-31 (×3): 500 mg via ORAL
  Filled 2023-08-29 (×3): qty 1

## 2023-08-29 MED ORDER — ALBUTEROL SULFATE (2.5 MG/3ML) 0.083% IN NEBU
2.5000 mg | INHALATION_SOLUTION | Freq: Four times a day (QID) | RESPIRATORY_TRACT | Status: DC
  Administered 2023-08-30: 2.5 mg via RESPIRATORY_TRACT
  Filled 2023-08-29: qty 3

## 2023-08-29 MED ORDER — IOHEXOL 350 MG/ML SOLN
75.0000 mL | Freq: Once | INTRAVENOUS | Status: AC | PRN
  Administered 2023-08-29: 75 mL via INTRAVENOUS

## 2023-08-29 MED ORDER — BENZONATATE 100 MG PO CAPS
100.0000 mg | ORAL_CAPSULE | Freq: Three times a day (TID) | ORAL | Status: DC | PRN
  Administered 2023-09-01: 100 mg via ORAL
  Filled 2023-08-29: qty 1

## 2023-08-29 MED ORDER — NICOTINE 14 MG/24HR TD PT24
14.0000 mg | MEDICATED_PATCH | Freq: Every day | TRANSDERMAL | Status: DC
  Administered 2023-08-29 – 2023-09-01 (×4): 14 mg via TRANSDERMAL
  Filled 2023-08-29 (×4): qty 1

## 2023-08-29 MED ORDER — METHYLPREDNISOLONE SODIUM SUCC 125 MG IJ SOLR
125.0000 mg | Freq: Once | INTRAMUSCULAR | Status: AC
  Administered 2023-08-29: 125 mg via INTRAVENOUS
  Filled 2023-08-29: qty 2

## 2023-08-29 MED ORDER — VITAMIN B-12 1000 MCG PO TABS
1000.0000 ug | ORAL_TABLET | Freq: Every day | ORAL | Status: DC
  Administered 2023-08-30 – 2023-09-01 (×3): 1000 ug via ORAL
  Filled 2023-08-29 (×3): qty 1

## 2023-08-29 MED ORDER — METOPROLOL TARTRATE 12.5 MG HALF TABLET
12.5000 mg | ORAL_TABLET | Freq: Two times a day (BID) | ORAL | Status: DC
  Administered 2023-08-29 – 2023-08-31 (×5): 12.5 mg via ORAL
  Filled 2023-08-29 (×6): qty 1

## 2023-08-29 MED ORDER — METHYLPREDNISOLONE SODIUM SUCC 125 MG IJ SOLR
60.0000 mg | Freq: Two times a day (BID) | INTRAMUSCULAR | Status: DC
  Administered 2023-08-30: 60 mg via INTRAVENOUS
  Filled 2023-08-29: qty 2

## 2023-08-29 MED ORDER — IPRATROPIUM BROMIDE 0.02 % IN SOLN
0.5000 mg | Freq: Four times a day (QID) | RESPIRATORY_TRACT | Status: DC
  Administered 2023-08-30: 0.5 mg via RESPIRATORY_TRACT
  Filled 2023-08-29: qty 2.5

## 2023-08-29 MED ORDER — FUROSEMIDE 40 MG PO TABS
40.0000 mg | ORAL_TABLET | Freq: Every day | ORAL | Status: DC
  Administered 2023-08-30 – 2023-09-01 (×3): 40 mg via ORAL
  Filled 2023-08-29: qty 2
  Filled 2023-08-29 (×3): qty 1

## 2023-08-29 MED ORDER — ONDANSETRON HCL 4 MG PO TABS: 4.0000 mg | ORAL_TABLET | Freq: Four times a day (QID) | ORAL | Status: DC | PRN

## 2023-08-29 NOTE — ED Notes (Signed)
IP at bedside

## 2023-08-29 NOTE — ED Triage Notes (Signed)
 Pt been feeling sob for 2 weeks now, no chest pain. Hx of COPD, smoker. Pt room air SPO2 in the 60s, Pt placed on 5L [place] in triage, up to 87%

## 2023-08-29 NOTE — ED Provider Notes (Signed)
 [place] EMERGENCY DEPARTMENT AT [facility] Provider Note   CSN: [id number] Arrival date & time: 08/29/23  1317     History  Chief Complaint  Patient presents with   Shortness of Breath    Kathryn Banks is a 68 y.o. female.  HPI 68 year old female presents with shortness of breath and hypoxia.  She has a history of COPD and is a current smoker.  She wears oxygen  at night.  She has a chronic cough but her dyspnea and cough are worse over the last few days.  She denies fevers or sore throat.  There has been no with leg swelling.  She checked her oxygen  this morning and it was around 60% on room air, she put on her oxygen  and it only came up into the high 60s so she came here.  Here she was hypoxic and put on 5 L.  She is feeling a lot better.  Has maybe had some wheezing at night.  She has been having intermittent pain under her breasts, on both sides but not usually at the same time, randomly for the past 6 months or so.  Those pains are worse and still random and intermittent over the past 3 days.  Home Medications Prior to Admission medications   Medication Sig Start Date End Date Taking? Authorizing Provider  albuterol  (VENTOLIN  HFA) 108 (90 Base) MCG/ACT inhaler Inhale 1 puff into the lungs every 6 (six) hours as needed for shortness of breath. 07/30/22 07/30/23  [provider]  albuterol -ipratropium (COMBIVENT) 18-103 MCG/ACT inhaler Inhale 2 puffs into the lungs every 6 (six) hours as needed for wheezing or shortness of breath. 09/01/15   Iver Marker, MD  amLODipine  (NORVASC ) 5 MG tablet Take 1 tablet (5 mg total) by mouth daily. 02/01/19 01/14/23  Modena Andes, MD  baclofen (LIORESAL) 10 MG tablet Take 1 tablet by mouth 3 (three) times daily as needed. 01/06/23   [provider]  benzonatate  (TESSALON ) 100 MG capsule Take 1 capsule (100 mg total) by mouth 3 (three) times daily as needed for cough. 12/05/22   Magdalene School, MD  cyanocobalamin  (VITAMIN B12)  1000 MCG tablet Take 1,000 mcg by mouth daily.    [provider]  diphenhydramine -acetaminophen  (TYLENOL  PM) 25-500 MG TABS tablet Take 1 tablet by mouth at bedtime.    [provider]  furosemide  (LASIX ) 40 MG tablet Take 1 tablet (40 mg total) by mouth daily. 12/06/22   Magdalene School, MD  MAGNESIUM  OXIDE 400 PO Take by mouth.    [provider]  metoprolol  tartrate (LOPRESSOR ) 25 MG tablet Take 0.5 tablets (12.5 mg total) by mouth 2 (two) times daily. 02/01/19 01/14/23  Modena Andes, MD  nicotine  (NICODERM CQ  - DOSED IN MG/24 HOURS) 14 mg/24hr patch Place 1 patch (14 mg total) onto the skin daily. 12/06/22   Magdalene School, MD  omeprazole  (PRILOSEC  OTC) 20 MG tablet Take 20 mg by mouth daily.    [provider]  sertraline  (ZOLOFT ) 25 MG tablet Take 1 tablet (25 mg total) by mouth at bedtime. 12/05/22 01/14/23  Magdalene School, MD  spironolactone  (ALDACTONE ) 25 MG tablet Take 1 tablet (25 mg total) by mouth daily. 12/06/22 01/14/23  Magdalene School, MD  tiotropium (SPIRIVA) 18 MCG inhalation capsule Place 18 mcg into inhaler and inhale daily.    [provider]      Allergies    Patient has no known allergies.    Review of Systems   Review of Systems  Constitutional:  Negative for fever.  Respiratory:  Positive for cough and shortness of breath.   Cardiovascular:  Positive for chest pain. Negative for leg swelling.  Gastrointestinal:  Positive for nausea. Negative for abdominal pain.    Physical Exam Updated Vital Signs BP (!) 141/77   Pulse 81   Temp 98.3 F (36.8 C) (Oral)   Resp (!) 24   Ht 5\' 4"  (1.626 m)   Wt 61 kg   LMP 09/25/2005 (Approximate)   SpO2 92%   BMI 23.08 kg/m  Physical Exam Vitals and nursing note reviewed.  Constitutional:      General: She is not in acute distress.    Appearance: She is well-developed. She is not ill-appearing or diaphoretic.  HENT:     Head: Normocephalic and atraumatic.  Cardiovascular:      Rate and Rhythm: Normal rate and regular rhythm.     Heart sounds: Normal heart sounds.  Pulmonary:     Effort: Pulmonary effort is normal. No tachypnea or accessory muscle usage.     Breath sounds: Examination of the right-lower field reveals decreased breath sounds. Decreased breath sounds present.  Abdominal:     Palpations: Abdomen is soft.     Tenderness: There is no abdominal tenderness.  Musculoskeletal:     Right lower leg: No edema.     Left lower leg: No edema.  Skin:    General: Skin is warm and dry.  Neurological:     Mental Status: She is alert.     ED Results / Procedures / Treatments   Labs (all labs ordered are listed, but only abnormal results are displayed) Labs Reviewed  COMPREHENSIVE METABOLIC PANEL WITH GFR - Abnormal; Notable for the following components:      Result Value   Sodium 129 (*)    Chloride 96 (*)    CO2 21 (*)    Calcium  8.3 (*)    Total Protein 8.2 (*)    Albumin 3.3 (*)    AST 44 (*)    Alkaline Phosphatase 131 (*)    All other components within normal limits  CBC WITH DIFFERENTIAL/PLATELET - Abnormal; Notable for the following components:   Hemoglobin 8.9 (*)    HCT 30.4 (*)    MCV 69.9 (*)    MCH 20.5 (*)    MCHC 29.3 (*)    RDW 20.0 (*)    Platelets 415 (*)    All other components within normal limits  TROPONIN I (HIGH SENSITIVITY) - Abnormal; Notable for the following components:   Troponin I (High Sensitivity) 60 (*)    All other components within normal limits  TROPONIN I (HIGH SENSITIVITY) - Abnormal; Notable for the following components:   Troponin I (High Sensitivity) 68 (*)    All other components within normal limits  RESP PANEL BY RT-PCR (RSV, FLU A&B, COVID)  RVPGX2  CULTURE, BLOOD (ROUTINE X 2)  CULTURE, BLOOD (ROUTINE X 2)  EXPECTORATED SPUTUM ASSESSMENT W GRAM STAIN, RFLX TO RESP C  BRAIN NATRIURETIC PEPTIDE  HIV ANTIBODY (ROUTINE TESTING W REFLEX)  CREATININE, SERUM  BASIC METABOLIC PANEL WITH GFR  CBC     EKG EKG Interpretation Date/Time:  Saturday Aug 29 2023 14:45:20 EDT Ventricular Rate:  70 PR Interval:  182 QRS Duration:  118 QT Interval:  438 QTC Calculation: 473 R Axis:   5  Text Interpretation: Sinus rhythm Incomplete right bundle branch block Confirmed by Jerilynn Montenegro [phone]) on 08/29/2023 2:57:25 PM  Radiology CT Angio Chest  PE W and/or Wo Contrast Result Date: 08/29/2023 CLINICAL DATA:  Cough, shortness of breath, and hypoxia. COPD Pulmonary embolism (PE) suspected, high prob EXAM: CT ANGIOGRAPHY CHEST WITH CONTRAST TECHNIQUE: Multidetector CT imaging of the chest was performed using the standard protocol during bolus administration of intravenous contrast. Multiplanar CT image reconstructions and MIPs were obtained to evaluate the vascular anatomy. RADIATION DOSE REDUCTION: This exam was performed according to the departmental dose-optimization program which includes automated exposure control, adjustment of the mA and/or kV according to patient size and/or use of iterative reconstruction technique. CONTRAST:  75mL OMNIPAQUE  IOHEXOL  350 MG/ML SOLN COMPARISON:  CT angio chest 11/28/2022 FINDINGS: Cardiovascular: Satisfactory opacification of the pulmonary arteries to the segmental level. No evidence of pulmonary embolism. The main pulmonary artery is enlarged in caliber measuring up to 3.7 cm. Normal heart size. No significant pericardial effusion. The thoracic aorta is normal in caliber. Mild atherosclerotic plaque of the thoracic aorta. At least 3 vessel coronary artery calcifications. Mediastinum/Nodes: Stable bilateral hilar lymphadenopathy with as an example a 1.5 cm right hilar lymph node (5:74). Prominent mediastinal lymph nodes. No enlarged axillary lymph nodes. Thyroid  gland, trachea, and esophagus demonstrate no significant findings. Small hiatal hernia. Lungs/Pleura: Mild emphysematous changes. Peripheral reticulations of the lungs. Mosaic attenuation. Diffuse bronchial  wall thickening. Right base atelectasis. No focal consolidation. Stable scattered subpleural nodules within the right lung. No new pulmonary nodule. No pulmonary mass. No pleural effusion. No pneumothorax. Upper Abdomen: Nodular hepatic contour. Musculoskeletal: No chest wall abnormality. No suspicious lytic or blastic osseous lesions. No acute displaced fracture. Multilevel degenerative changes of the spine. Review of the MIP images confirms the above findings. IMPRESSION: 1. No pulmonary embolus. 2. Enlarged main pulmonary artery-correlate for pulmonary hypertension. 3. Pulmonary fibrosis and small airway disease. 4. Indeterminate bilateral lymphadenopathy and prominent mediastinal lymph nodes. 5. Stable pulmonary nodules with no new nodules. No further follow-up indicated. 6. Cirrhosis. 7. Small hiatal hernia. 8. Aortic Atherosclerosis (ICD10-I70.0) including at least 3 vessel coronary artery calcification. 9. Emphysema (ICD10-J43.9). Electronically Signed   By: Morgane  Naveau M.D.   On: 08/29/2023 19:01   DG Chest 2 View Result Date: 08/29/2023 CLINICAL DATA:  Cough, shortness of breath, and hypoxia.  COPD EXAM: CHEST - 2 VIEW COMPARISON:  None Available. FINDINGS: The heart size and mediastinal contours are within normal limits. Mild chronic interstitial prominence again noted. No evidence of acute infiltrate or edema. No pleural effusion. The visualized skeletal structures are unremarkable. IMPRESSION: Mild chronic interstitial lung disease. No acute findings. Electronically Signed   By: Marlyce Sine M.D.   On: 08/29/2023 14:38    Procedures Procedures    Medications Ordered in ED Medications  amLODipine  (NORVASC ) tablet 5 mg (has no administration in time range)  metoprolol  tartrate (LOPRESSOR ) tablet 12.5 mg (has no administration in time range)  spironolactone  (ALDACTONE ) tablet 25 mg (has no administration in time range)  nicotine  (NICODERM CQ  - dosed in mg/24 hours) patch 14 mg (has no  administration in time range)  diphenhydramine -acetaminophen  (TYLENOL  PM) 25-500 MG per tablet 1 tablet (has no administration in time range)  omeprazole  (PRILOSEC  OTC) EC tablet 20 mg (has no administration in time range)  cyanocobalamin  (VITAMIN B12) tablet 1,000 mcg (has no administration in time range)  tiotropium (SPIRIVA) inhalation capsule ([facility] use ONLY) 18 mcg (has no administration in time range)  benzonatate  (TESSALON ) capsule 100 mg (has no administration in time range)  furosemide  (LASIX ) tablet 40 mg (has no administration in time range)  cefTRIAXone  (ROCEPHIN )  1 g in sodium chloride  0.9 % 100 mL IVPB (has no administration in time range)  methylPREDNISolone  sodium succinate (SOLU-MEDROL ) 125 mg/2 mL injection 60 mg (has no administration in time range)    Followed by  predniSONE  (DELTASONE ) tablet 40 mg (has no administration in time range)  budesonide -glycopyrrolate-formoterol  (BREZTRI) 160-9-4.8 MCG/ACT inhaler 2 puff (has no administration in time range)  heparin injection 5,000 Units (has no administration in time range)  ondansetron  (ZOFRAN ) tablet 4 mg (has no administration in time range)    Or  ondansetron  (ZOFRAN ) injection 4 mg (has no administration in time range)  albuterol  (PROVENTIL ) (2.5 MG/3ML) 0.083% nebulizer solution 2.5 mg (has no administration in time range)  ipratropium (ATROVENT) nebulizer solution 0.5 mg (has no administration in time range)  albuterol  (PROVENTIL ) (2.5 MG/3ML) 0.083% nebulizer solution 2.5 mg (has no administration in time range)  guaiFENesin  (MUCINEX ) 12 hr tablet 600 mg (has no administration in time range)  hydrALAZINE  (APRESOLINE ) injection 5 mg (has no administration in time range)  iohexol  (OMNIPAQUE ) 350 MG/ML injection 75 mL (75 mLs Intravenous Contrast Given 08/29/23 1824)  methylPREDNISolone  sodium succinate (SOLU-MEDROL ) 125 mg/2 mL injection 125 mg (125 mg Intravenous Given 08/29/23 1946)    ED Course/ Medical Decision  Making/ A&P                                 Medical Decision Making Amount and/or Complexity of Data Reviewed Labs: ordered.    Details: Elevated troponins, flat Radiology: ordered and independent interpretation performed.    Details: No PE ECG/medicine tests: ordered and independent interpretation performed.    Details: RBBB  Risk Prescription drug management. Decision regarding hospitalization.   Patient presents with hypoxia and shortness of breath.  She is not in distress and feels a lot better while she is on the oxygen .  She is currently requiring about 5 L.  Workup shows no obvious pneumonia, some may be atelectasis but no significant infiltrate.  No PE on CTA.  Her lungs are pretty unremarkable on auscultation.  Maybe some decreased breath sounds but no wheezing.  With her elevated troponins and findings of possible pulmonary artery hypertension I think she will need admission and echo.  Troponins are relatively flat and I do not think this is ACS.  Discussed with hospitalist, Dr.Acheampong, who will admit.  He is also requesting a dose of Solu-Medrol .        Final Clinical Impression(s) / ED Diagnoses Final diagnoses:  Acute on chronic respiratory failure with hypoxia (HCC)  Elevated troponin    Rx / DC Orders ED Discharge Orders     None         Jerilynn Montenegro, MD 08/29/23 2031

## 2023-08-29 NOTE — H&P (Signed)
 History and Physical    Patient: Kathryn Banks NFA:[id number] DOB: 11/08/1955 DOA: 08/29/2023 DOS: the patient was seen and examined on 08/29/2023 PCP: Alexander Anes, MD  Patient coming from: Home  Chief Complaint:  Chief Complaint  Patient presents with   Shortness of Breath   HPI: Kathryn Banks is a 68 y.o. female with medical history significant of advacned COPD, long-term smoker, HTN, Cirrhosis of the liver and depression.  She presented to ED with worsening shortness of breath since 3 days ago.  At baseline, patient is on 3 L/min of oxygen  at night.  However over the past few days, she has developed worsening shortness of breath, cough, productive of sputum.  She also reports, exertional dyspnea with minimal effort.  She has hardly been unable to ambulate.  On account of the symptoms, patient decided to come to the emergency room.  She is currently requiring 5 L/min of oxygen  and saturating in the low 90s.  Her last admission in the hospital was 1 year prior. . On this visit, CTA chest ruled out PE.  There are findings of pulmonary fibrosis and emphysema.  Indeterminate bilateral lymphadenopathy and prominent mediastinal lymph nodes were noted.  Etiology for these findings is unclear.  Pulmonary artery was noted to be significantly enlarged suggesting pulmonary artery hypertension.  Bronchopneumonia, cirrhosis, aortic atherosclerosis were also noted.   In the ED, patient was given Solu-Medrol  and rounds of bronchodilators. Review of Systems: As mentioned in the history of present illness. All other systems reviewed and are negative. Past Medical History:  Diagnosis Date   Acid reflux    Arthritis    FINGERS   COPD (chronic obstructive pulmonary disease) (HCC)    Depression    ETOH abuse    Hepatitis    C-PT STATED SHE WAS TOLD THIS 2014   Hypertension    OFF MEDS FOR FEW YEARS   Past Surgical History:  Procedure Laterality Date   CESAREAN SECTION     OPEN REDUCTION INTERNAL  FIXATION (ORIF) DISTAL RADIAL FRACTURE Right 10/15/2017   Procedure: OPEN REDUCTION INTERNAL FIXATION (ORIF) DISTAL RADIAL FRACTURE;  Surgeon: Molli Angelucci, MD;  Location: [facility] ORS;  Service: Orthopedics;  Laterality: Right;   Social History:  reports that she has been smoking cigarettes. She started smoking about 9 months ago. She has a 45.4 pack-year smoking history. She has never used smokeless tobacco. She reports that she does not currently use alcohol. She reports that she does not currently use drugs after having used the following drugs: Opium, Benzodiazepines, and Heroin.  No Known Allergies  Family History  Problem Relation Age of Onset   Hypertension Mother    Ulcers Mother        Recent bleeding ulcers requiring transfusion, 2020   Cancer Father    Hypertension Father    CAD Father 19   CVA Sister 42   Breast cancer Neg Hx     Prior to Admission medications   Medication Sig Start Date End Date Taking? Authorizing Provider  albuterol  (VENTOLIN  HFA) 108 (90 Base) MCG/ACT inhaler Inhale 1 puff into the lungs every 6 (six) hours as needed for shortness of breath. 07/30/22 07/30/23  [provider]  albuterol -ipratropium (COMBIVENT) 18-103 MCG/ACT inhaler Inhale 2 puffs into the lungs every 6 (six) hours as needed for wheezing or shortness of breath. 09/01/15   Iver Marker, MD  amLODipine  (NORVASC ) 5 MG tablet Take 1 tablet (5 mg total) by mouth daily. 02/01/19 01/14/23  Modena Andes,  MD  baclofen (LIORESAL) 10 MG tablet Take 1 tablet by mouth 3 (three) times daily as needed. 01/06/23   [provider]  benzonatate  (TESSALON ) 100 MG capsule Take 1 capsule (100 mg total) by mouth 3 (three) times daily as needed for cough. 12/05/22   Magdalene School, MD  cyanocobalamin  (VITAMIN B12) 1000 MCG tablet Take 1,000 mcg by mouth daily.    [provider]  diphenhydramine -acetaminophen  (TYLENOL  PM) 25-500 MG TABS tablet Take 1 tablet by mouth at bedtime.    [provider]  furosemide  (LASIX ) 40 MG tablet Take 1 tablet (40 mg total) by mouth daily. 12/06/22   Magdalene School, MD  MAGNESIUM  OXIDE 400 PO Take by mouth.    [provider]  metoprolol  tartrate (LOPRESSOR ) 25 MG tablet Take 0.5 tablets (12.5 mg total) by mouth 2 (two) times daily. 02/01/19 01/14/23  Modena Andes, MD  nicotine  (NICODERM CQ  - DOSED IN MG/24 HOURS) 14 mg/24hr patch Place 1 patch (14 mg total) onto the skin daily. 12/06/22   Magdalene School, MD  omeprazole  (PRILOSEC  OTC) 20 MG tablet Take 20 mg by mouth daily.    [provider]  sertraline  (ZOLOFT ) 25 MG tablet Take 1 tablet (25 mg total) by mouth at bedtime. 12/05/22 01/14/23  Magdalene School, MD  spironolactone  (ALDACTONE ) 25 MG tablet Take 1 tablet (25 mg total) by mouth daily. 12/06/22 01/14/23  Magdalene School, MD  tiotropium (SPIRIVA) 18 MCG inhalation capsule Place 18 mcg into inhaler and inhale daily.    [provider]    Physical Exam: Vitals:   08/29/23 1900 08/29/23 1920 08/29/23 1930 08/29/23 2000  BP: 130/81  (!) 141/79 (!) 141/77  Pulse: 89  84 81  Resp: (!) 23  17 (!) 24  Temp:      TempSrc:      SpO2: 97%  92% 92%  Weight:  61 kg    Height:  5\' 4"  (1.626 m)     General: Patient is a pleasant elderly female who was seen laying comfortably in bed.  She is saturating well at 97%. HEENT: Head is atraumatic, wears glasses, not pale or icteric. Neck: Supple with no JVD. Chest: Coarse breath sounds bilaterally throughout all lung fields.  No wheezing or Rales appreciated. Abdomen: Soft nontender with no organomegaly Extremities without pedal edema CNS shows no focal deficit  Data Reviewed:  Sodium is 129, potassium 3.9, chloride 96, bicarb 21, glucose 84, BUN 15, creatinine 0.78 AST 44, ALT 22, troponin 60 and 68 respectively  Hemoglobin 8.9 with hematocrit of 69.9, platelet count is 115, WBC is 5.7 neutrophil count is 63  CTA chest ruled out PE.  There are findings of pulmonary  fibrosis and emphysema.  Indeterminate bilateral lymphadenopathy and prominent mediastinal lymph nodes were noted.  Etiology for these findings is unclear.  Pulmonary artery was noted to be significantly enlarged suggesting pulmonary artery hypertension.  Bronchopneumonia, cirrhosis, aortic atherosclerosis were also noted.    Assessment and Plan: 68 year old chronic tobacco smoker, history of COPD, pulmonary hypertension, pulmonary fibrosis, cirrhosis of the liver who presents on account of acute onset shortness of breath from baseline.  Symptoms consistent with COPD exacerbation.  CTA ruled out pulm emboli.  Incidental finding of elevated troponin from baseline.  #1.  Acute hypoxic respiratory failure secondary to acute COPD exacerbation.  Underlying chronic emphysema and pulmonary fibrosis.  CT scan ruled out pulm emboli on this admission.  Evidence of enlarged pulmonary artery suggesting worsening pulmonary artery hypertension.  Patient  will be optimized with Solu-Medrol , bronchodilators and antibiotics.  Oxygen  supplementation will be offered.  Will wean down as tolerated by patient.  Respiratory panel has been ordered and pending. Patient has no regular follow-up with pulmonology.  She is currently not on any LABA/inhaled corticosteroids.  Patient will be ordered Breztri.  Pulmonology will be consulted to help optimize long term management.  #Chronic tobacco dependence: Tobacco cessation counseling was again reemphasized.  Patient will be offered nicotine  patch during the course of stay.  #Acute on chronic anemia: MCV suggesting microcytic anemia.  Anemia workup will be ordered.  Patient has underlying cirrhosis and may have ongoing GI blood loss.  Will defer to primary team on further workup.  Will monitor H&H daily.  No active bleeding noted at this time.  #Elevated cardiac markers: Delta troponin however remains flat.  EKG review shows no acute ischemia.  Elevated cardiac markers may likely be  due to demand ischemia from hypoxia.  Will obtain echocardiogram to evaluate left ventricular ejection fraction.  Will consult cardiology if any concerns.  #Acute on chronic hyponatremia: Patient is asymptomatic at this time.  We will follow closely.  Likely related to chronic cirrhosis.  #Essential hypertension: Patient will continue on amlodipine  and Lopressor  as per her home regimen.  #History of depression/anxiety: Continue on Zoloft .   Advance Care Planning:   Code Status: Full Code   Consults: Pulmonology has been consulted.  Family Communication: No family member at bedside.  Patient is alert oriented and will update family as appropriate.  Severity of Illness: The appropriate patient status for this patient is INPATIENT. Inpatient status is judged to be reasonable and necessary in order to provide the required intensity of service to ensure the patient's safety. The patient's presenting symptoms, physical exam findings, and initial radiographic and laboratory data in the context of their chronic comorbidities is felt to place them at high risk for further clinical deterioration. Furthermore, it is not anticipated that the patient will be medically stable for discharge from the hospital within 2 midnights of admission.   * I certify that at the point of admission it is my clinical judgment that the patient will require inpatient hospital care spanning beyond 2 midnights from the point of admission due to high intensity of service, high risk for further deterioration and high frequency of surveillance required.*  Author: Theodora Fish, MD 08/29/2023 8:23 PM  For on call review [email or web address].

## 2023-08-30 DIAGNOSIS — K7460 Unspecified cirrhosis of liver: Secondary | ICD-10-CM | POA: Diagnosis not present

## 2023-08-30 DIAGNOSIS — R918 Other nonspecific abnormal finding of lung field: Secondary | ICD-10-CM

## 2023-08-30 DIAGNOSIS — J9621 Acute and chronic respiratory failure with hypoxia: Secondary | ICD-10-CM

## 2023-08-30 DIAGNOSIS — J441 Chronic obstructive pulmonary disease with (acute) exacerbation: Secondary | ICD-10-CM | POA: Diagnosis not present

## 2023-08-30 DIAGNOSIS — F17210 Nicotine dependence, cigarettes, uncomplicated: Secondary | ICD-10-CM

## 2023-08-30 DIAGNOSIS — J9601 Acute respiratory failure with hypoxia: Secondary | ICD-10-CM

## 2023-08-30 LAB — CBC
HCT: 33.3 % — ABNORMAL LOW (ref 36.0–46.0)
Hemoglobin: 9.6 g/dL — ABNORMAL LOW (ref 12.0–15.0)
MCH: 20 pg — ABNORMAL LOW (ref 26.0–34.0)
MCHC: 28.8 g/dL — ABNORMAL LOW (ref 30.0–36.0)
MCV: 69.2 fL — ABNORMAL LOW (ref 80.0–100.0)
Platelets: 394 10*3/uL (ref 150–400)
RBC: 4.81 MIL/uL (ref 3.87–5.11)
RDW: 19.9 % — ABNORMAL HIGH (ref 11.5–15.5)
WBC: 4.9 10*3/uL (ref 4.0–10.5)
nRBC: 0 % (ref 0.0–0.2)

## 2023-08-30 LAB — BASIC METABOLIC PANEL WITH GFR
Anion gap: 11 (ref 5–15)
BUN: 13 mg/dL (ref 8–23)
CO2: 23 mmol/L (ref 22–32)
Calcium: 8.8 mg/dL — ABNORMAL LOW (ref 8.9–10.3)
Chloride: 97 mmol/L — ABNORMAL LOW (ref 98–111)
Creatinine, Ser: 0.79 mg/dL (ref 0.44–1.00)
GFR, Estimated: >60 mL/min (ref >60)
Glucose, Bld: 156 mg/dL — ABNORMAL HIGH (ref 70–99)
Potassium: 4.6 mmol/L (ref 3.5–5.1)
Sodium: 131 mmol/L — ABNORMAL LOW (ref 135–145)

## 2023-08-30 MED ORDER — ALPRAZOLAM 0.5 MG PO TABS
1.0000 mg | ORAL_TABLET | Freq: Every evening | ORAL | Status: DC | PRN
  Administered 2023-08-30: 1 mg via ORAL
  Filled 2023-08-30: qty 2

## 2023-08-30 MED ORDER — REVEFENACIN 175 MCG/3ML IN SOLN
175.0000 ug | Freq: Every day | RESPIRATORY_TRACT | Status: DC
  Filled 2023-08-30: qty 3

## 2023-08-30 MED ORDER — IPRATROPIUM BROMIDE 0.02 % IN SOLN: 0.5000 mg | Freq: Two times a day (BID) | RESPIRATORY_TRACT | Status: DC

## 2023-08-30 MED ORDER — METHYLPREDNISOLONE SODIUM SUCC 40 MG IJ SOLR
40.0000 mg | Freq: Two times a day (BID) | INTRAMUSCULAR | Status: AC
  Administered 2023-08-30 – 2023-09-01 (×4): 40 mg via INTRAVENOUS
  Filled 2023-08-30 (×4): qty 1

## 2023-08-30 MED ORDER — AZITHROMYCIN 500 MG PO TABS
500.0000 mg | ORAL_TABLET | Freq: Every day | ORAL | Status: DC
  Administered 2023-08-30 – 2023-09-01 (×3): 500 mg via ORAL
  Filled 2023-08-30 (×3): qty 1

## 2023-08-30 MED ORDER — BUDESONIDE 0.5 MG/2ML IN SUSP
0.5000 mg | Freq: Two times a day (BID) | RESPIRATORY_TRACT | Status: DC
  Administered 2023-08-30 – 2023-09-01 (×4): 0.5 mg via RESPIRATORY_TRACT
  Filled 2023-08-30 (×5): qty 2

## 2023-08-30 MED ORDER — POLYETHYLENE GLYCOL 3350 17 G PO PACK
17.0000 g | PACK | Freq: Two times a day (BID) | ORAL | Status: DC
  Administered 2023-09-01: 17 g via ORAL
  Filled 2023-08-30 (×3): qty 1

## 2023-08-30 MED ORDER — ARFORMOTEROL TARTRATE 15 MCG/2ML IN NEBU
15.0000 ug | INHALATION_SOLUTION | Freq: Two times a day (BID) | RESPIRATORY_TRACT | Status: DC
  Administered 2023-08-30 – 2023-09-01 (×4): 15 ug via RESPIRATORY_TRACT
  Filled 2023-08-30 (×5): qty 2

## 2023-08-30 MED ORDER — SENNOSIDES-DOCUSATE SODIUM 8.6-50 MG PO TABS
1.0000 | ORAL_TABLET | Freq: Two times a day (BID) | ORAL | Status: DC
  Administered 2023-08-30 – 2023-09-01 (×4): 1 via ORAL
  Filled 2023-08-30 (×4): qty 1

## 2023-08-30 MED ORDER — REVEFENACIN 175 MCG/3ML IN SOLN
175.0000 ug | Freq: Every day | RESPIRATORY_TRACT | Status: DC
  Administered 2023-08-31 – 2023-09-01 (×2): 175 ug via RESPIRATORY_TRACT
  Filled 2023-08-30 (×3): qty 3

## 2023-08-30 MED ORDER — BISACODYL 10 MG RE SUPP: 10.0000 mg | Freq: Every day | RECTAL | Status: DC | PRN

## 2023-08-30 MED ORDER — BACLOFEN 10 MG PO TABS
20.0000 mg | ORAL_TABLET | Freq: Three times a day (TID) | ORAL | Status: DC | PRN
  Administered 2023-08-30 – 2023-08-31 (×2): 20 mg via ORAL
  Filled 2023-08-30 (×3): qty 2

## 2023-08-30 MED ORDER — PREDNISONE 10 MG PO TABS: 40.0000 mg | ORAL_TABLET | Freq: Every day | ORAL | Status: DC

## 2023-08-30 MED ORDER — SERTRALINE HCL 25 MG PO TABS
25.0000 mg | ORAL_TABLET | Freq: Every day | ORAL | Status: DC
  Administered 2023-08-30 – 2023-08-31 (×2): 25 mg via ORAL
  Filled 2023-08-30 (×2): qty 1

## 2023-08-30 NOTE — Progress Notes (Signed)
 Turned pt 's  O2 down to 5l/min (N/C). O2 sats 88% upon recheck. Has returned pt to 6l/min. O2 sat increased to 90%.

## 2023-08-30 NOTE — Progress Notes (Signed)
 PROGRESS NOTE    Kathryn Banks  FAO:[id number] DOB: [birth year] DOA: 08/29/2023 PCP: Alexander Anes, MD   Brief Narrative:  Kathryn Banks is a 68 y.o. female with medical history significant of advacned COPD, long-term smoker, HTN, Cirrhosis of the liver and depression.  She presented to ED with worsening shortness of breath since 3 days ago.  At baseline, patient is on 3 L/min of oxygen  at night.  However over the past few days, she has developed worsening shortness of breath, cough, productive of sputum.  She also reports, exertional dyspnea with minimal effort.  She has hardly been unable to ambulate.  On account of the symptoms, patient decided to come to the emergency room.  She is currently requiring 5 L/min of oxygen  and saturating in the low 90s.  Her last admission in the hospital was 1 year prior.   Underwent workup w/ CTA PE Protocol showed no PE but did show findings of pulmonary fibrosis and emphysema as well as indeterminant bilateral lymphadenopathy and prominent mediastinal lymph nodes.  Pulmonary artery was noted to be significant enlarged suggesting pulmonary hypertension.  Pulmonary was consulted for further evaluation and she is on treatment as below.  Undergoing further workup and getting an echocardiogram with bubble study and checking a right upper quadrant ultrasound for her liver cirrhosis.  Assessment and Plan:  Acute on Chronic Hypoxic respiratory Failure 2/2 to acute COPD exacerbation with Underlying chronic emphysema and Pulmonary Fibrosis: CT scan ruled out pulm emboli on this admission.  Evidence of enlarged pulmonary artery suggesting worsening pulmonary artery hypertension.  Patient will be optimized with Solu-Medrol , bronchodilators and antibiotics. Wears 3 L of Supplemental O2 via [place] and up to 6 Liters here.  Patient has no regular follow-up with pulmonology.  She is currently not on any LABA/inhaled corticosteroids.  Patient will be ordered Breztri.  Pulmonology will  be consulted to help optimize long term management recommending stopping Breztri and transition to Brovana , budesonide  and Yupelri  with as needed albuterol  nebs for now.  She is getting antibiotic coverage with CAP coverage with IV Ceftriaxone  + Azithromycin  and checking sputum culture. BNP is elevated at 314.3. ECHO + Bubble Study being ordered. PT/OT recommending no F/u  Cirrhosis of the Liver: RUQ U/S is being ordered and Acute Hepatitis Panel is being checked. Check LFTs in the AM. Ast was 44 on last check. ALT was 22.    Chronic Tobacco Dependence: Tobacco cessation counseling was again reemphasized.  C/w Nicotine  Patch 14 mg TD   Acute on Chronic Microcytic Anemia: MCV suggesting microcytic anemia.  Hgb/Hct is now 9.6/33.3 w/ MCV of 69.2. Anemia Panel done and noted an iron level of 20, UIBC of 518, TIBC 538, saturation was 4, ferritin level 12, folate 4.3, vitamin B12 level 878.  Will initiate folic acid  1 mg p.o. daily. Patient has underlying cirrhosis and may have ongoing GI blood loss.  Continue to monitor for signs and symptoms of bleeding; no overt bleeding noted.  Repeat CBC in a.m.   Elevated Cardiac Markers: Delta troponin however remains flat (60 -> 68).  EKG review shows no acute ischemia.  Elevated cardiac markers may likely be due to demand ischemia from hypoxia. Obtain Echocardiogram to evaluate left ventricular ejection fraction but getting a Bubble Study as well.  Will consult cardiology if any concerns.   Acute on Chronic Hyponatremia: Patient is asymptomatic at this time. Na+ Trend:  Recent Labs  Lab 08/29/23 1339 08/30/23 0413  NA 129* 131*  -We  will follow closely.  Likely related to chronic cirrhosis.   Essential HTN:  C/w Amlodipine  5 mg po Daily and Metoprolol  12.5 mg po BID. C/w IV Hydralazine  5 mg q6hprn HBP. CTM BP per Protocol. Last BP reading was 145/74    Depression/Anxiety: C/w Sertraline  25 mg po at bedtime and Alprazolam  1 mg po qHSprn Sleep  GERD/GI  Prophylaxis: C/w PPI with Omeprazole  Substitution of Pantoprazole  40 mg po Daily  Hypoalbuminemia: Patient's Albumin Level is now 3.3. CTM and Trend and repeat CMP in the AM   DVT prophylaxis: heparin injection 5,000 Units Start: 08/29/23 2200 SCDs Start: 08/29/23 2004    Code Status: Full Code Family Communication: No family present @ bedside   Disposition Plan:  Level of care: Med-Surg Status is: Inpatient Remains inpatient appropriate because: Needs further clinical improvement and clearance by the specialists   Consultants:  Pulmonary  Procedures:  As delineated as above  Antimicrobials:  Anti-infectives (From admission, onward)    Start     Dose/Rate Route Frequency Ordered Stop   08/30/23 1545  azithromycin  (ZITHROMAX ) tablet 500 mg        500 mg Oral Daily 08/30/23 1458 09/04/23 0959   08/29/23 2015  cefTRIAXone  (ROCEPHIN ) 1 g in sodium chloride  0.9 % 100 mL IVPB        1 g 200 mL/hr over 30 Minutes Intravenous Every 24 hours 08/29/23 2003 09/03/23 2014       Subjective: Seen and examined at bedside and states that she is having increased sputum production and is yellowish.  No nausea or vomiting.  Thinks her breathing is doing a little bit better but still requiring least 5 to 6 L.  Denies any chest pain.  No other concerns or complaints at this time.  Objective: Vitals:   08/30/23 0749 08/30/23 0830 08/30/23 1157 08/30/23 1534  BP: 130/74  139/87 (!) 145/74  Pulse: 65 65 68 75  Resp: 18 18 18 18   Temp: 98.1 F (36.7 C)  98.6 F (37 C) 98.1 F (36.7 C)  TempSrc: Oral   Oral  SpO2: 90% 91% 91% 92%  Weight:      Height:        Intake/Output Summary (Last 24 hours) at 08/30/2023 1855 Last data filed at 08/30/2023 1300 Gross per 24 hour  Intake 1130.38 ml  Output 950 ml  Net 180.38 ml   Filed Weights   08/29/23 1920 08/29/23 2150  Weight: 61 kg 65.2 kg   Examination: Physical Exam:  Constitutional: WN/WD older appearing than her stated age Caucasian  female in no acute distress Respiratory: Diminished to auscultation bilaterally with some coarse breath sounds but has some slight crackles and rhonchi. No appreciable wheezing or rales. Normal respiratory effort and patient is not tachypenic. No accessory muscle use.  Wearing supplemental oxygen  via nasal cannula and wearing 6 L Cardiovascular: RRR, no murmurs / rubs / gallops. S1 and S2 auscultated.  1+ lower extremity edema  Abdomen: Soft, non-tender, non-distended. Bowel sounds positive.  GU: Deferred. Musculoskeletal: No clubbing / cyanosis of digits/nails. No joint deformity upper and lower extremities.  Skin: No rashes, lesions, ulcers limited skin evaluation. No induration; Warm and dry.  Neurologic: CN 2-12 grossly intact with no focal deficits. Romberg sign and cerebellar reflexes not assessed.  Psychiatric: Normal judgment and insight. Alert and oriented x 3.   Data Reviewed: I have personally reviewed following labs and imaging studies  CBC: Recent Labs  Lab 08/29/23 1339 08/30/23 0413  WBC 5.7 4.9  NEUTROABS 3.6  --   HGB 8.9* 9.6*  HCT 30.4* 33.3*  MCV 69.9* 69.2*  PLT 415* 394   Basic Metabolic Panel: Recent Labs  Lab 08/29/23 1339 08/29/23 2120 08/30/23 0413  NA 129*  --  131*  K 3.9  --  4.6  CL 96*  --  97*  CO2 21*  --  23  GLUCOSE 84  --  156*  BUN 15  --  13  CREATININE 0.78 0.83 0.79  CALCIUM  8.3*  --  8.8*   GFR: Estimated Creatinine Clearance: 60.5 mL/min (by C-G formula based on SCr of 0.79 mg/dL). Liver Function Tests: Recent Labs  Lab 08/29/23 1339  AST 44*  ALT 22  ALKPHOS 131*  BILITOT 0.4  PROT 8.2*  ALBUMIN 3.3*   No results for input(s): "LIPASE", "AMYLASE" in the last 168 hours. No results for input(s): "AMMONIA" in the last 168 hours. Coagulation Profile: No results for input(s): "INR", "PROTIME" in the last 168 hours. Cardiac Enzymes: No results for input(s): "CKTOTAL", "CKMB", "CKMBINDEX", "TROPONINI" in the last 168  hours. BNP (last 3 results) No results for input(s): "PROBNP" in the last 8760 hours. HbA1C: No results for input(s): "HGBA1C" in the last 72 hours. CBG: No results for input(s): "GLUCAP" in the last 168 hours. Lipid Profile: No results for input(s): "CHOL", "HDL", "LDLCALC", "TRIG", "CHOLHDL", "LDLDIRECT" in the last 72 hours. Thyroid  Function Tests: No results for input(s): "TSH", "T4TOTAL", "FREET4", "T3FREE", "THYROIDAB" in the last 72 hours. Anemia Panel: Recent Labs    08/29/23 2120  VITAMINB12 878  FOLATE 4.3*  FERRITIN 12  TIBC 538*  IRON 20*  RETICCTPCT 1.7   Sepsis Labs: No results for input(s): "PROCALCITON", "LATICACIDVEN" in the last 168 hours.  Recent Results (from the past 240 hours)  Resp panel by RT-PCR (RSV, Flu A&B, Covid) Anterior Nasal Swab     Status: None   Collection Time: 08/29/23  1:39 PM   Specimen: Anterior Nasal Swab  Result Value Ref Range Status   SARS Coronavirus 2 by RT PCR NEGATIVE NEGATIVE Final   Influenza A by PCR NEGATIVE NEGATIVE Final   Influenza B by PCR NEGATIVE NEGATIVE Final    Comment: (NOTE) The Xpert Xpress SARS-CoV-2/FLU/RSV plus assay is intended as an aid in the diagnosis of influenza from Nasopharyngeal swab specimens and should not be used as a sole basis for treatment. Nasal washings and aspirates are unacceptable for Xpert Xpress SARS-CoV-2/FLU/RSV testing.  Fact Sheet for Patients: BloggerCourse.com  Fact Sheet for Healthcare Providers: SeriousBroker.it  This test is not yet approved or cleared by the [place]  FDA and has been authorized for detection and/or diagnosis of SARS-CoV-2 by FDA under an Emergency Use Authorization (EUA). This EUA will remain in effect (meaning this test can be used) for the duration of the COVID-19 declaration under Section 564(b)(1) of the Act, 21 U.S.C. section 360bbb-3(b)(1), unless the authorization is terminated  or revoked.     Resp Syncytial Virus by PCR NEGATIVE NEGATIVE Final    Comment: (NOTE) Fact Sheet for Patients: BloggerCourse.com  Fact Sheet for Healthcare Providers: SeriousBroker.it  This test is not yet approved or cleared by the [place]  FDA and has been authorized for detection and/or diagnosis of SARS-CoV-2 by FDA under an Emergency Use Authorization (EUA). This EUA will remain in effect (meaning this test can be used) for the duration of the COVID-19 declaration under Section 564(b)(1) of the Act, 21 U.S.C. section 360bbb-3(b)(1), unless the authorization is terminated or revoked.  Performed at [facility], [address] [place], [place] [address]   Culture, blood (routine x 2)     Status: None (Preliminary result)   Collection Time: 08/29/23  1:39 PM   Specimen: BLOOD RIGHT FOREARM  Result Value Ref Range Status   Specimen Description BLOOD RIGHT FOREARM  Final   Special Requests   Final    BOTTLES DRAWN AEROBIC AND ANAEROBIC Blood Culture adequate volume   Culture   Final    NO GROWTH < 24 HOURS Performed at [facility], [address] [place], [place] [address]    Report Status PENDING  Incomplete  Culture, blood (routine x 2)     Status: None (Preliminary result)   Collection Time: 08/29/23  1:44 PM   Specimen: BLOOD RIGHT ARM  Result Value Ref Range Status   Specimen Description BLOOD RIGHT ARM  Final   Special Requests   Final    BOTTLES DRAWN AEROBIC AND ANAEROBIC Blood Culture adequate volume   Culture   Final    NO GROWTH < 24 HOURS Performed at [facility], [address], [place], [place] [address]    Report Status PENDING  Incomplete    Radiology Studies: CT Angio Chest PE W and/or Wo Contrast Result Date: 08/29/2023 CLINICAL DATA:  Cough, shortness of breath, and hypoxia. COPD Pulmonary embolism (PE) suspected, high prob EXAM: CT ANGIOGRAPHY CHEST WITH CONTRAST  TECHNIQUE: Multidetector CT imaging of the chest was performed using the standard protocol during bolus administration of intravenous contrast. Multiplanar CT image reconstructions and MIPs were obtained to evaluate the vascular anatomy. RADIATION DOSE REDUCTION: This exam was performed according to the departmental dose-optimization program which includes automated exposure control, adjustment of the mA and/or kV according to patient size and/or use of iterative reconstruction technique. CONTRAST:  75mL OMNIPAQUE  IOHEXOL  350 MG/ML SOLN COMPARISON:  CT angio chest 11/28/2022 FINDINGS: Cardiovascular: Satisfactory opacification of the pulmonary arteries to the segmental level. No evidence of pulmonary embolism. The main pulmonary artery is enlarged in caliber measuring up to 3.7 cm. Normal heart size. No significant pericardial effusion. The thoracic aorta is normal in caliber. Mild atherosclerotic plaque of the thoracic aorta. At least 3 vessel coronary artery calcifications. Mediastinum/Nodes: Stable bilateral hilar lymphadenopathy with as an example a 1.5 cm right hilar lymph node (5:74). Prominent mediastinal lymph nodes. No enlarged axillary lymph nodes. Thyroid  gland, trachea, and esophagus demonstrate no significant findings. Small hiatal hernia. Lungs/Pleura: Mild emphysematous changes. Peripheral reticulations of the lungs. Mosaic attenuation. Diffuse bronchial wall thickening. Right base atelectasis. No focal consolidation. Stable scattered subpleural nodules within the right lung. No new pulmonary nodule. No pulmonary mass. No pleural effusion. No pneumothorax. Upper Abdomen: Nodular hepatic contour. Musculoskeletal: No chest wall abnormality. No suspicious lytic or blastic osseous lesions. No acute displaced fracture. Multilevel degenerative changes of the spine. Review of the MIP images confirms the above findings. IMPRESSION: 1. No pulmonary embolus. 2. Enlarged main pulmonary artery-correlate for  pulmonary hypertension. 3. Pulmonary fibrosis and small airway disease. 4. Indeterminate bilateral lymphadenopathy and prominent mediastinal lymph nodes. 5. Stable pulmonary nodules with no new nodules. No further follow-up indicated. 6. Cirrhosis. 7. Small hiatal hernia. 8. Aortic Atherosclerosis (ICD10-I70.0) including at least 3 vessel coronary artery calcification. 9. Emphysema (ICD10-J43.9). Electronically Signed   By: Morgane  Naveau M.D.   On: 08/29/2023 19:01   DG Chest 2 View Result Date: 08/29/2023 CLINICAL DATA:  Cough, shortness of breath, and hypoxia.  COPD EXAM: CHEST -  2 VIEW COMPARISON:  None Available. FINDINGS: The heart size and mediastinal contours are within normal limits. Mild chronic interstitial prominence again noted. No evidence of acute infiltrate or edema. No pleural effusion. The visualized skeletal structures are unremarkable. IMPRESSION: Mild chronic interstitial lung disease. No acute findings. Electronically Signed   By: Marlyce Sine M.D.   On: 08/29/2023 14:38   Scheduled Meds:  diphenhydrAMINE   25 mg Oral QHS   And   acetaminophen   500 mg Oral QHS   amLODipine   5 mg Oral Daily   arformoterol   15 mcg Nebulization BID   azithromycin   500 mg Oral Daily   budesonide  (PULMICORT ) nebulizer solution  0.5 mg Nebulization BID   cyanocobalamin   1,000 mcg Oral Daily   furosemide   40 mg Oral Daily   guaiFENesin   600 mg Oral BID   heparin  5,000 Units Subcutaneous Q8H   methylPREDNISolone  (SOLU-MEDROL ) injection  40 mg Intravenous Q12H   Followed by   Cecily Cohen ON 09/02/2023] predniSONE   40 mg Oral Q breakfast   metoprolol  tartrate  12.5 mg Oral BID   nicotine   14 mg Transdermal Daily   pantoprazole   40 mg Oral Daily   polyethylene glycol  17 g Oral BID   [START ON 08/31/2023] revefenacin   175 mcg Nebulization Daily   senna-docusate  1 tablet Oral BID   sertraline   25 mg Oral QHS   spironolactone   25 mg Oral Daily   Continuous Infusions:  cefTRIAXone  (ROCEPHIN )  IV  Stopped (08/29/23 2359)    LOS: 1 day   Aura Leeds, DO [facility] Available via Epic secure chat 7am-7pm After these hours, please refer to coverage provider listed on [email or web address] 08/30/2023, 6:55 PM

## 2023-08-30 NOTE — Consult Note (Signed)
 NAME:  Kathryn Banks, MRN:  [id number], DOB:  April 20, 1955, LOS: 1 ADMISSION DATE:  08/29/2023, CONSULTATION DATE:  08/30/23 REFERRING MD:  Aura Leeds CHIEF COMPLAINT:  COPD, PAH   History of Present Illness:  Kathryn Banks is a 67 year old woman daily smoker with GERD, alcohol abuse, hypertension and COPD who is admitted for acute hypoxemic respiratory failure due to COPD exacerbation.   She reports increased dyspnea and wheezing over past couple of days. She uses 3L of oxygen  at home. Up to 6L here. She is smoking half a pack per day.   CT Chest scan shows no PE. She has enlarged PA trunk. Emphysema. Cirrhosis. Pulmonary fibrosis.   Pertinent  Medical History   Past Medical History:  Diagnosis Date   Acid reflux    Arthritis    FINGERS   COPD (chronic obstructive pulmonary disease) (HCC)    Depression    ETOH abuse    Hepatitis    C-PT STATED SHE WAS TOLD THIS 2014   Hypertension    OFF MEDS FOR FEW YEARS     Significant Hospital Events: Including procedures, antibiotic start and stop dates in addition to other pertinent events     Interim History / Subjective:  As above  Objective    Blood pressure 139/87, pulse 68, temperature 98.6 F (37 C), resp. rate 18, height 5\' 4"  (1.626 m), weight 65.2 kg, last menstrual period 09/25/2005, SpO2 91%.        Intake/Output Summary (Last 24 hours) at 08/30/2023 1450 Last data filed at 08/30/2023 1300 Gross per 24 hour  Intake 1130.38 ml  Output 950 ml  Net 180.38 ml   Filed Weights   08/29/23 1920 08/29/23 2150  Weight: 61 kg 65.2 kg    Examination: General: no acute distress, resting in bed HENT: moist mucous membranes, sclera anicteric Lungs: scattered wheezing Cardiovascular: rrr, no murmurs Abdomen: soft, non-tender, non-distended Extremities: warm, no edema Neuro: alert, oriented, moving all extremities GU: n/a  Echo 10/2022 LV EF 65-70%, grade I diastolic dysfunction, LA severely dilated, RA severely  dilated  Resolved problem list   Assessment and Plan   Acute Hypoxemic Respiratory Failure COPD/Emphysema with Exacerbation Pulmonary nodules - stable Cigarette Smoker Possible Cirrhosis  Plan - continue IV steroids - stop breztri and transition to brovana , budesonide  and yupelri  with PRN albuterol  nebs - ceftriaxone  + azithromycin  for possible CAP coverage - check sputum culture - Continue oxygen  for goal SpO2 92% or higher - check RUQ to evaluate for cirrhosis. Check hepatitis panel.  PCCM will continue to follow  Best Practice (right click and "Reselect all SmartList Selections" daily)   Per primary  Labs   CBC: Recent Labs  Lab 08/29/23 1339 08/30/23 0413  WBC 5.7 4.9  NEUTROABS 3.6  --   HGB 8.9* 9.6*  HCT 30.4* 33.3*  MCV 69.9* 69.2*  PLT 415* 394    Basic Metabolic Panel: Recent Labs  Lab 08/29/23 1339 08/29/23 2120 08/30/23 0413  NA 129*  --  131*  K 3.9  --  4.6  CL 96*  --  97*  CO2 21*  --  23  GLUCOSE 84  --  156*  BUN 15  --  13  CREATININE 0.78 0.83 0.79  CALCIUM  8.3*  --  8.8*   GFR: Estimated Creatinine Clearance: 60.5 mL/min (by C-G formula based on SCr of 0.79 mg/dL). Recent Labs  Lab 08/29/23 1339 08/30/23 0413  WBC 5.7 4.9    Liver Function Tests:  Recent Labs  Lab 08/29/23 1339  AST 44*  ALT 22  ALKPHOS 131*  BILITOT 0.4  PROT 8.2*  ALBUMIN 3.3*   No results for input(s): "LIPASE", "AMYLASE" in the last 168 hours. No results for input(s): "AMMONIA" in the last 168 hours.  ABG    Component Value Date/Time   PHART 7.37 12/02/2022 1355   PCO2ART 43 12/02/2022 1355   PO2ART 67 (L) 12/02/2022 1355   HCO3 24.9 12/02/2022 1355   TCO2 24 11/28/2022 2035   ACIDBASEDEF 0.6 12/02/2022 1355   O2SAT 93.3 12/02/2022 1355     Coagulation Profile: No results for input(s): "INR", "PROTIME" in the last 168 hours.  Cardiac Enzymes: No results for input(s): "CKTOTAL", "CKMB", "CKMBINDEX", "TROPONINI" in the last 168  hours.  HbA1C: Hgb A1c MFr Bld  Date/Time Value Ref Range Status  01/31/2019 06:43 PM 5.5 4.8 - 5.6 % Final    Comment:    (NOTE) Pre diabetes:          5.7%-6.4% Diabetes:              >6.4% Glycemic control for   <7.0% adults with diabetes     CBG: No results for input(s): "GLUCAP" in the last 168 hours.  Review of Systems:   Review of Systems  Constitutional:  Positive for malaise/fatigue. Negative for chills and fever.  HENT:  Negative for congestion.   Respiratory:  Positive for cough, sputum production, shortness of breath and wheezing. Negative for hemoptysis.   Cardiovascular:  Negative for chest pain, palpitations, orthopnea, claudication and leg swelling.  Gastrointestinal:  Negative for abdominal pain, heartburn, nausea and vomiting.  Musculoskeletal:  Negative for joint pain.     Past Medical History:  She,  has a past medical history of Acid reflux, Arthritis, COPD (chronic obstructive pulmonary disease) (HCC), Depression, ETOH abuse, Hepatitis, and Hypertension.   Surgical History:   Past Surgical History:  Procedure Laterality Date   CESAREAN SECTION     OPEN REDUCTION INTERNAL FIXATION (ORIF) DISTAL RADIAL FRACTURE Right 10/15/2017   Procedure: OPEN REDUCTION INTERNAL FIXATION (ORIF) DISTAL RADIAL FRACTURE;  Surgeon: Molli Angelucci, MD;  Location: [facility] ORS;  Service: Orthopedics;  Laterality: Right;     Social History:   reports that she has been smoking cigarettes. She started smoking about 9 months ago. She has a 45.4 pack-year smoking history. She has never used smokeless tobacco. She reports that she does not currently use alcohol. She reports that she does not currently use drugs after having used the following drugs: Opium, Benzodiazepines, and Heroin.   Family History:  Her family history includes CAD (age of onset: 65) in her father; CVA (age of onset: 68) in her sister; Cancer in her father; Hypertension in her father and mother; Ulcers in her  mother. There is no history of Breast cancer.   Allergies No Known Allergies   Home Medications  Prior to Admission medications   Medication Sig Start Date End Date Taking? Authorizing Provider  ALPRAZolam  (XANAX ) 1 MG tablet Take 1 mg by mouth as needed for anxiety.   Yes [provider]  amLODipine  (NORVASC ) 5 MG tablet Take 1 tablet (5 mg total) by mouth daily. Patient taking differently: Take 5 mg by mouth in the morning. 02/01/19 08/29/23 Yes Pahwani, Ravi, MD  baclofen (LIORESAL) 20 MG tablet Take 20 mg by mouth 3 (three) times daily as needed for muscle spasms. 01/06/23  Yes [provider]  cyanocobalamin  (VITAMIN B12) 1000 MCG tablet Take 1,000  mcg by mouth in the morning.   Yes [provider]  diphenhydramine -acetaminophen  (TYLENOL  PM) 25-500 MG TABS tablet Take 1 tablet by mouth at bedtime.   Yes [provider]  furosemide  (LASIX ) 40 MG tablet Take 1 tablet (40 mg total) by mouth daily. Patient taking differently: Take 40 mg by mouth in the morning. 12/06/22  Yes Magdalene School, MD  MAGNESIUM  OXIDE 400 PO Take 1 tablet by mouth in the morning and at bedtime.   Yes [provider]  metoprolol  tartrate (LOPRESSOR ) 25 MG tablet Take 0.5 tablets (12.5 mg total) by mouth 2 (two) times daily. 02/01/19 08/29/23 Yes Pahwani, Martha Slack, MD  nicotine  (NICODERM CQ  - DOSED IN MG/24 HOURS) 21 mg/24hr patch Place 21 mg onto the skin as needed.   Yes [provider]  omeprazole  (PRILOSEC  OTC) 20 MG tablet Take 20 mg by mouth in the morning.   Yes [provider]  sertraline  (ZOLOFT ) 25 MG tablet Take 1 tablet (25 mg total) by mouth at bedtime. 12/05/22 08/29/23 Yes Magdalene School, MD  Tiotropium Bromide Monohydrate 2.5 MCG/ACT AERS Inhale 2 puffs into the lungs as needed.   Yes [provider]     Critical care time: n/a`    Duaine German, MD [facility] Office: [phone]

## 2023-08-30 NOTE — Hospital Course (Signed)
 Kathryn Banks is a 68 y.o. female with medical history significant of advacned COPD, long-term smoker, HTN, Cirrhosis of the liver and depression.  She presented to ED with worsening shortness of breath since 3 days ago.  At baseline, patient is on 3 L/min of oxygen  at night.  However over the past few days, she has developed worsening shortness of breath, cough, productive of sputum.  She also reports, exertional dyspnea with minimal effort.  She has hardly been unable to ambulate.  On account of the symptoms, patient decided to come to the emergency room.  She is currently requiring 5 L/min of oxygen  and saturating in the low 90s.  Her last admission in the hospital was 1 year prior.   Underwent workup w/ CTA PE Protocol showed no PE but did show findings of pulmonary fibrosis and emphysema as well as indeterminant bilateral lymphadenopathy and prominent mediastinal lymph nodes.  Pulmonary artery was noted to be significant enlarged suggesting pulmonary hypertension.  Pulmonary was consulted for further evaluation and she is on treatment as below.  Undergoing further workup and getting an echocardiogram with bubble study and checking a right upper quadrant ultrasound for her liver cirrhosis.  Assessment and Plan:  Acute on Chronic Hypoxic respiratory Failure 2/2 to acute COPD exacerbation with Underlying chronic emphysema and Pulmonary Fibrosis: CT scan ruled out pulm emboli on this admission.  Evidence of enlarged pulmonary artery suggesting worsening pulmonary artery hypertension.  Patient will be optimized with Solu-Medrol , bronchodilators and antibiotics. Wears 3 L of Supplemental O2 via [place] and up to 6 Liters here.  Patient has no regular follow-up with pulmonology.  She is currently not on any LABA/inhaled corticosteroids.  Patient will be ordered Breztri.  Pulmonology will be consulted to help optimize long term management recommending stopping Breztri and transition to Brovana , budesonide  and Yupelri   with as needed albuterol  nebs for now.  She is getting antibiotic coverage with CAP coverage with IV Ceftriaxone  + Azithromycin  and checking sputum culture. BNP is elevated at 314.3. ECHO + Bubble Study being ordered. PT/OT recommending no F/u  Cirrhosis of the Liver: RUQ U/S is being ordered and Acute Hepatitis Panel is being checked. Check LFTs in the AM. Ast was 44 on last check. ALT was 22.    Chronic Tobacco Dependence: Tobacco cessation counseling was again reemphasized.  C/w Nicotine  Patch 14 mg TD   Acute on Chronic Microcytic Anemia: MCV suggesting microcytic anemia.  Hgb/Hct is now 9.6/33.3 w/ MCV of 69.2. Anemia Panel done and noted an iron level of 20, UIBC of 518, TIBC 538, saturation was 4, ferritin level 12, folate 4.3, vitamin B12 level 878.  Will initiate folic acid  1 mg p.o. daily. Patient has underlying cirrhosis and may have ongoing GI blood loss.  Continue to monitor for signs and symptoms of bleeding; no overt bleeding noted.  Repeat CBC in a.m.   Elevated Cardiac Markers: Delta troponin however remains flat (60 -> 68).  EKG review shows no acute ischemia.  Elevated cardiac markers may likely be due to demand ischemia from hypoxia. Obtain Echocardiogram to evaluate left ventricular ejection fraction but getting a Bubble Study as well.  Will consult cardiology if any concerns.   Acute on Chronic Hyponatremia: Patient is asymptomatic at this time. Na+ Trend:  Recent Labs  Lab 08/29/23 1339 08/30/23 0413  NA 129* 131*  -We will follow closely.  Likely related to chronic cirrhosis.   Essential HTN:  C/w Amlodipine  5 mg po Daily and Metoprolol  12.5 mg  po BID. C/w IV Hydralazine  5 mg q6hprn HBP. CTM BP per Protocol. Last BP reading was 145/74    Depression/Anxiety: C/w Sertraline  25 mg po at bedtime and Alprazolam  1 mg po qHSprn Sleep  GERD/GI Prophylaxis: C/w PPI with Omeprazole  Substitution of Pantoprazole  40 mg po Daily  Hypoalbuminemia: Patient's Albumin Level is now 3.3.  CTM and Trend and repeat CMP in the AM

## 2023-08-30 NOTE — Progress Notes (Signed)
 Pt admitted to room 5C 13. Transported via stretcher.

## 2023-08-30 NOTE — Evaluation (Signed)
 Physical Therapy Evaluation Patient Details Name: Kathryn Banks MRN: [id number] DOB: 1955/11/26 Today's Date: 08/30/2023  History of Present Illness  Kathryn Banks is a 68 y.o. female admitted 5/31 with worsening shortness of breath.  At baseline, patient is on 3 L/min of oxygen  at night.  Pt  developed worsening shortness of breath, cough, productive of sputum.  She also reports, exertional dyspnea with minimal effort.  She was hardly able to ambulate.  On admit, requiring 5 L/min of oxygen  and saturating in the low 90s.  CTA chest ruled out PE.  There are findings of pulmonary fibrosis and emphysema.  Indeterminate bilateral lymphadenopathy and prominent mediastinal lymph nodes were noted.   PMH: COPD, long-term smoker, HTN, Cirrhosis of the liver and depression.  Clinical Impression  Pt admitted with above diagnosis. Pt was able to ambulate without device with overall good safety awareness. Pt does desaturate on RA as low as 83% at rest and needed 4LO2 at rest and with activity to keep sats >88%.  Instructed pt to use incentive spirometer 10 x each hour and watched her complete it while there. Will follow acutely to assist with endurance training and Energy conservation techniques.  Also will assist with monitoring O2 at rest and with activity.  No f/u anticipated and encouraged pt to consider a rollator.  Pt currently with functional limitations due to the deficits listed below (see PT Problem List). Pt will benefit from acute skilled PT to increase their independence and safety with mobility to allow discharge.         SATURATION QUALIFICATIONS: (This note is used to comply with regulatory documentation for home oxygen )   Patient Saturations on Room Air at Rest = 83%   Patient Saturations on Room Air while Ambulating = NT as desat on RA at rest   Patient Saturations on 4 Liters of oxygen  while Ambulating = 88%   Please briefly explain why patient needs home oxygen :Pt requiring 4LO2 at rest and  with activity to keep O2 saturation > 88%.    If plan is discharge home, recommend the following:     Can travel by private vehicle        Equipment Recommendations Rollator (4 wheels) (Pt declined need for this)  Recommendations for Other Services       Functional Status Assessment Patient has had a recent decline in their functional status and demonstrates the ability to make significant improvements in function in a reasonable and predictable amount of time.     Precautions / Restrictions Precautions Precautions: Fall Restrictions Weight Bearing Restrictions Per Provider Order: No      Mobility  Bed Mobility Overal bed mobility: Independent                  Transfers Overall transfer level: Needs assistance   Transfers: Sit to/from Stand Sit to Stand: Independent                Ambulation/Gait Ambulation/Gait assistance: Contact guard assist Gait Distance (Feet): 90 Feet Assistive device: None Gait Pattern/deviations: Step-through pattern, Decreased stride length, Drifts right/left   Gait velocity interpretation: <1.31 ft/sec, indicative of household ambulator   General Gait Details: Pt ambulated to hallway with CGA. Pt slightly unsteady but no overt LOB.  Mentioned to pt that a rollator may helpher at home to be safer with gait and give her a place to rest and she reports her "house is too crowded".  Pt states she furniture walks and declines rollator. She also  reports she doesnt walk far.  Stairs            Wheelchair Mobility     Tilt Bed    Modified Rankin (Stroke Patients Only)       Balance Overall balance assessment: Needs assistance Sitting-balance support: No upper extremity supported, Feet supported Sitting balance-Leahy Scale: Fair     Standing balance support: No upper extremity supported, During functional activity Standing balance-Leahy Scale: Fair                               Pertinent Vitals/Pain  Pain Assessment Pain Assessment: No/denies pain    Home Living Family/patient expects to be discharged to:: Private residence Living Arrangements: Spouse/significant other Available Help at Discharge: Family;Available PRN/intermittently Type of Home: House Home Access: Stairs to enter Entrance Stairs-Rails: Right Entrance Stairs-Number of Steps: 2   Home Layout: One level Home Equipment: None      Prior Function Prior Level of Function : Independent/Modified Independent;Driving             Mobility Comments: no AD use ADLs Comments: ind, drives     Extremity/Trunk Assessment   Upper Extremity Assessment Upper Extremity Assessment: Defer to OT evaluation    Lower Extremity Assessment Lower Extremity Assessment: Generalized weakness    Cervical / Trunk Assessment Cervical / Trunk Assessment: Normal  Communication   Communication Communication: No apparent difficulties    Cognition Arousal: Alert Behavior During Therapy: WFL for tasks assessed/performed   PT - Cognitive impairments: No apparent impairments                         Following commands: Intact       Cueing       General Comments      Exercises     Assessment/Plan    PT Assessment Patient needs continued PT services  PT Problem List Decreased activity tolerance;Decreased balance;Decreased mobility;Decreased knowledge of use of DME;Decreased safety awareness;Decreased knowledge of precautions;Cardiopulmonary status limiting activity       PT Treatment Interventions DME instruction;Gait training;Functional mobility training;Therapeutic activities;Therapeutic exercise;Balance training;Patient/family education;Stair training    PT Goals (Current goals can be found in the Care Plan section)  Acute Rehab PT Goals Patient Stated Goal: to go home PT Goal Formulation: With patient Time For Goal Achievement: 09/13/23 Potential to Achieve Goals: Good    Frequency Min 2X/week      Co-evaluation               AM-PAC PT "6 Clicks" Mobility  Outcome Measure Help needed turning from your back to your side while in a flat bed without using bedrails?: None Help needed moving from lying on your back to sitting on the side of a flat bed without using bedrails?: None Help needed moving to and from a bed to a chair (including a wheelchair)?: A Little Help needed standing up from a chair using your arms (e.g., wheelchair or bedside chair)?: A Little Help needed to walk in hospital room?: A Little Help needed climbing 3-5 steps with a railing? : A Little 6 Click Score: 20    End of Session Equipment Utilized During Treatment: Gait belt;Oxygen  Activity Tolerance: Patient limited by fatigue Patient left: in chair;with call bell/phone within reach;with chair alarm set Nurse Communication: Mobility status PT Visit Diagnosis: Muscle weakness (generalized) (M62.81);Other abnormalities of gait and mobility (R26.89)    Time: 0981-1914 PT Time Calculation (  min) (ACUTE ONLY): 24 min   Charges:   PT Evaluation $PT Eval Moderate Complexity: 1 Mod PT Treatments $Gait Training: 8-22 mins PT General Charges $$ ACUTE PT VISIT: 1 Visit         Jenica Costilow M,PT Acute Rehab Services [phone]   Florencia Hunter 08/30/2023, 1:12 PM

## 2023-08-30 NOTE — Progress Notes (Signed)
 SATURATION QUALIFICATIONS: (This note is used to comply with regulatory documentation for home oxygen )  Patient Saturations on Room Air at Rest = 83%  Patient Saturations on Room Air while Ambulating = NT as desat on RA at rest  Patient Saturations on 4 Liters of oxygen  while Ambulating = 88%  Please briefly explain why patient needs home oxygen :Pt requiring 4LO2 at rest and with activity to keep O2 saturation > 88%.    Vu Liebman M,PT Acute Rehab Services [phone]

## 2023-08-30 NOTE — Plan of Care (Signed)
 Pt alert and oriented x4. Skin warm and dry. Color wnl. No complaints of pain. O2 infusing via n/c @ 6l/min. No resp distress at present. Tolerated IV antibiotic and po meds. Plan of care discussed. Call light in reach. Sr x2 elevated. Bed in low position.

## 2023-08-31 ENCOUNTER — Inpatient Hospital Stay (HOSPITAL_COMMUNITY)

## 2023-08-31 DIAGNOSIS — J441 Chronic obstructive pulmonary disease with (acute) exacerbation: Secondary | ICD-10-CM | POA: Diagnosis not present

## 2023-08-31 DIAGNOSIS — J9621 Acute and chronic respiratory failure with hypoxia: Secondary | ICD-10-CM

## 2023-08-31 DIAGNOSIS — K7460 Unspecified cirrhosis of liver: Secondary | ICD-10-CM | POA: Diagnosis not present

## 2023-08-31 LAB — MAGNESIUM: Magnesium: 1.9 mg/dL (ref 1.7–2.4)

## 2023-08-31 LAB — CBC WITH DIFFERENTIAL/PLATELET
Abs Immature Granulocytes: 0.02 10*3/uL (ref 0.00–0.07)
Basophils Absolute: 0 10*3/uL (ref 0.0–0.1)
Basophils Relative: 0 %
Eosinophils Absolute: 0 10*3/uL (ref 0.0–0.5)
Eosinophils Relative: 0 %
HCT: 30.7 % — ABNORMAL LOW (ref 36.0–46.0)
Hemoglobin: 8.8 g/dL — ABNORMAL LOW (ref 12.0–15.0)
Immature Granulocytes: 1 %
Lymphocytes Relative: 16 %
Lymphs Abs: 0.7 10*3/uL (ref 0.7–4.0)
MCH: 20.1 pg — ABNORMAL LOW (ref 26.0–34.0)
MCHC: 28.7 g/dL — ABNORMAL LOW (ref 30.0–36.0)
MCV: 70.1 fL — ABNORMAL LOW (ref 80.0–100.0)
Monocytes Absolute: 0.2 10*3/uL (ref 0.1–1.0)
Monocytes Relative: 5 %
Neutro Abs: 3.4 10*3/uL (ref 1.7–7.7)
Neutrophils Relative %: 78 %
Platelets: 363 10*3/uL (ref 150–400)
RBC: 4.38 MIL/uL (ref 3.87–5.11)
RDW: 20 % — ABNORMAL HIGH (ref 11.5–15.5)
WBC: 4.3 10*3/uL (ref 4.0–10.5)
nRBC: 0 % (ref 0.0–0.2)

## 2023-08-31 LAB — HEPATITIS PANEL, ACUTE
HCV Ab: REACTIVE — AB
Hep A IgM: NONREACTIVE
Hep B C IgM: NONREACTIVE
Hepatitis B Surface Ag: NONREACTIVE

## 2023-08-31 LAB — ECHOCARDIOGRAM COMPLETE BUBBLE STUDY
AR max vel: 2.84 cm2
AV Peak grad: 7.6 mmHg
Ao pk vel: 1.38 m/s
Area-P 1/2: 4.15 cm2
S' Lateral: 3.2 cm

## 2023-08-31 LAB — COMPREHENSIVE METABOLIC PANEL WITH GFR
ALT: 21 U/L (ref 0–44)
AST: 32 U/L (ref 15–41)
Albumin: 3.3 g/dL — ABNORMAL LOW (ref 3.5–5.0)
Alkaline Phosphatase: 112 U/L (ref 38–126)
Anion gap: 12 (ref 5–15)
BUN: 22 mg/dL (ref 8–23)
CO2: 22 mmol/L (ref 22–32)
Calcium: 8.8 mg/dL — ABNORMAL LOW (ref 8.9–10.3)
Chloride: 96 mmol/L — ABNORMAL LOW (ref 98–111)
Creatinine, Ser: 0.8 mg/dL (ref 0.44–1.00)
GFR, Estimated: >60 mL/min (ref >60)
Glucose, Bld: 136 mg/dL — ABNORMAL HIGH (ref 70–99)
Potassium: 4.4 mmol/L (ref 3.5–5.1)
Sodium: 130 mmol/L — ABNORMAL LOW (ref 135–145)
Total Bilirubin: 0.5 mg/dL (ref 0.0–1.2)
Total Protein: 8.3 g/dL — ABNORMAL HIGH (ref 6.5–8.1)

## 2023-08-31 LAB — PHOSPHORUS: Phosphorus: 4.4 mg/dL (ref 2.5–4.6)

## 2023-08-31 MED ORDER — BUTALBITAL-APAP-CAFFEINE 50-325-40 MG PO TABS
1.0000 | ORAL_TABLET | Freq: Four times a day (QID) | ORAL | Status: DC | PRN
  Administered 2023-08-31: 1 via ORAL
  Filled 2023-08-31: qty 1

## 2023-08-31 NOTE — Evaluation (Signed)
 Occupational Therapy Evaluation and Discharge Patient Details Name: Kathryn Banks MRN: [id number] DOB: May 13, 1955 Today's Date: 08/31/2023   History of Present Illness   Kathryn Banks is a 68 y.o. female admitted 5/31 with worsening shortness of breath.  At baseline, patient is on 3 L/min of oxygen  at night.  Pt  developed worsening shortness of breath, cough, productive of sputum.  She also reports, exertional dyspnea with minimal effort.  She was hardly able to ambulate.  On admit, requiring 5 L/min of oxygen  and saturating in the low 90s.  CTA chest ruled out PE.  There are findings of pulmonary fibrosis and emphysema.  Indeterminate bilateral lymphadenopathy and prominent mediastinal lymph nodes were noted.   PMH: COPD, long-term smoker, HTN, Cirrhosis of the liver and depression.     Clinical Impressions This 68 yo female admitted for above presents to acute OT with PLOF of being independent with basic ADLs and IADLs. Currently she at an independent to Mod I level no AD for mobility but needs supplemental O2 at 3 liters all the time due to drop in O2 sats in high 70's on RA with minimal ambulation (20 feet) and back up to low 90's on 3 liters of O2 with purse lipped breathing. No further OT needs, education has been completed and hand out provided for energy conservation. We will D/C from acute OT.     If plan is discharge home, recommend the following:   Assist for transportation     Functional Status Assessment   Patient has had a recent decline in their functional status and demonstrates the ability to make significant improvements in function in a reasonable and predictable amount of time. (without further need for skilled OT')     Equipment Recommendations   Tub/shower seat      Precautions/Restrictions   Precautions Precautions: Fall Recall of Precautions/Restrictions: Intact Restrictions Weight Bearing Restrictions Per Provider Order: No     Mobility Bed  Mobility Overal bed mobility: Independent                  Transfers Overall transfer level: Independent                        Balance Overall balance assessment: Independent                                         ADL either performed or assessed with clinical judgement   ADL Overall ADL's : Modified independent                                       General ADL Comments: Educated on the 5 P's of Energy Conservation and provided handout     Vision Baseline Vision/History: 1 Wears glasses Ability to See in Adequate Light: 0 Adequate Patient Visual Report: No change from baseline              Pertinent Vitals/Pain Pain Assessment Pain Assessment: No/denies pain     Extremity/Trunk Assessment Upper Extremity Assessment Upper Extremity Assessment: Overall WFL for tasks assessed           Communication Communication Communication: No apparent difficulties   Cognition Arousal: Alert Behavior During Therapy: WFL for tasks assessed/performed Cognition: No apparent impairments  Following commands: Intact                  Home Living Family/patient expects to be discharged to:: Private residence Living Arrangements: Spouse/significant other Available Help at Discharge: Family;Available PRN/intermittently Type of Home: House   Entrance Stairs-Number of Steps: 2 Entrance Stairs-Rails: Right       Bathroom Shower/Tub: Tub/shower unit;Curtain   Bathroom Toilet: Standard     Home Equipment: None          Prior Functioning/Environment Prior Level of Function : Independent/Modified Independent;Driving             Mobility Comments: no AD use ADLs Comments: ind, drives    OT Problem List: Decreased activity tolerance (without supplemental O2)        OT Goals(Current goals can be found in the care plan section)   Acute Rehab OT Goals Patient  Stated Goal: to go home soon         AM-PAC OT "6 Clicks" Daily Activity     Outcome Measure Help from another person eating meals?: None Help from another person taking care of personal grooming?: None Help from another person toileting, which includes using toliet, bedpan, or urinal?: None Help from another person bathing (including washing, rinsing, drying)?: None Help from another person to put on and taking off regular upper body clothing?: None Help from another person to put on and taking off regular lower body clothing?: None 6 Click Score: 24   End of Session    Activity Tolerance: Patient tolerated treatment well Patient left: in bed;with call bell/phone within reach  OT Visit Diagnosis: Muscle weakness (generalized) (M62.81)                Time: 9147-8295 OT Time Calculation (min): 35 min Charges:  OT General Charges $OT Visit: 1 Visit OT Evaluation $OT Eval Moderate Complexity: 1 Mod OT Treatments $Self Care/Home Management : 8-22 mins Merryl Abraham OT Acute Rehabilitation Services Office [phone]    Lenox Raider 08/31/2023, 10:35 AM

## 2023-08-31 NOTE — TOC Initial Note (Signed)
 Transition of Care [facility]) - Initial/Assessment Note    Patient Details  Name: Kathryn Banks MRN: [id number] Date of Birth: 14-Oct-1955  [facility] CM/SW Contact:    Juliane Och, LCSW Phone Number: 08/31/2023, 10:07 AM  Clinical Narrative:                  10:07 AM CSW introduced self and role to patient. Patient informed CSW that she resides with significant other whom could provide transportation upon discharge. Patient declined SNF/HH history. Patient confirmed that she has home oxygen  (with [employer]). Patient was agreeable to OT recommendation of continued home oxygen  and shower seat. CSW relayed recommendations to [facility].  Expected Discharge Plan: Home/Self Care Barriers to Discharge: Continued Medical Work up   Patient Goals and CMS Choice Patient states their goals for this hospitalization and ongoing recovery are:: to return home          Expected Discharge Plan and Services   Discharge Planning Services: CM Consult Post Acute Care Choice: Durable Medical Equipment Living arrangements for the past 2 months: Single Family Home                                      Prior Living Arrangements/Services Living arrangements for the past 2 months: Single Family Home Lives with:: Significant Other Patient language and need for interpreter reviewed:: Yes Do you feel safe going back to the place where you live?: Yes            Criminal Activity/Legal Involvement Pertinent to Current Situation/Hospitalization: No - Comment as needed  Activities of Daily Living   ADL Screening (condition at time of admission) Independently performs ADLs?: Yes (appropriate for developmental age) Is the patient deaf or have difficulty hearing?: No Does the patient have difficulty seeing, even when wearing glasses/contacts?: No Does the patient have difficulty concentrating, remembering, or making decisions?: No  Permission Sought/Granted Permission sought to share  information with : Family Supports, Oceanographer granted to share information with : No (Contact information on chart)  Share Information with NAME: Rod Ethyl Hering  Permission granted to share info w AGENCY: DME  Permission granted to share info w Relationship: Friend  Permission granted to share info w Contact Information: [phone]  Emotional Assessment Appearance:: Appears stated age Attitude/Demeanor/Rapport: Engaged Affect (typically observed): Accepting, Adaptable, Appropriate, Calm, Stable, Pleasant Orientation: : Oriented to Self, Oriented to Place, Oriented to  Time, Oriented to Situation Alcohol / Substance Use: Not Applicable Psych Involvement: No (comment)  Admission diagnosis:  Elevated troponin [R79.89] COPD with acute exacerbation (HCC) [J44.1] Acute on chronic respiratory failure with hypoxia (HCC) [J96.21] Patient Active Problem List   Diagnosis Date Noted   Abnormal SPEP 01/14/2023   Thrombocytosis 01/14/2023   Tobacco abuse 12/05/2022   Acute pulmonary edema (HCC) 12/03/2022   Acute exacerbation of chronic obstructive pulmonary disease (COPD) (HCC) 12/03/2022   Acute on chronic respiratory failure with hypoxia (HCC) 12/03/2022   Acute diastolic CHF (congestive heart failure) (HCC) 12/02/2022   Community acquired pneumonia of right lower lobe of lung 12/02/2022   Acute hypoxic respiratory failure (HCC) 11/28/2022   Right lower lobe pneumonia 11/28/2022   Continuous dependence on cigarette smoking 11/28/2022   History of depression 11/28/2022   Cirrhosis (HCC) 11/28/2022   GAD (generalized anxiety disorder) 11/28/2022   Microcytic anemia 11/28/2022   Hyperlipidemia 11/28/2022   Elevated brain natriuretic peptide (BNP)  level 11/28/2022   Subclinical hypothyroidism 02/01/2019   Chest pain 01/31/2019   Essential hypertension 01/31/2019   COPD with acute exacerbation (HCC) 01/31/2019   Tobacco dependence 01/31/2019   History of  substance abuse (HCC) 01/31/2019   Pulmonary nodules/lesions, multiple 01/31/2019   PCP:  Alexander Anes, MD Pharmacy:   [facility] [address] [place] - [address] [place] [address] Phone: [phone] Fax: [phone]  [facility] [address] [place] [address] Phone: [phone] Fax: [phone]     Social Drivers of Health (SDOH) Social History: SDOH Screenings   Food Insecurity: No Food Insecurity (08/30/2023)  Housing: Low Risk  (08/30/2023)  Transportation Needs: No Transportation Needs (08/30/2023)  Utilities: Not At Risk (08/30/2023)  Financial Resource Strain: Medium Risk (01/23/2023)   Received from [facility]  Social Connections: Moderately Integrated (08/30/2023)  Tobacco Use: High Risk (08/29/2023)   SDOH Interventions:     Readmission Risk Interventions     No data to display

## 2023-08-31 NOTE — Plan of Care (Signed)

## 2023-08-31 NOTE — Consult Note (Signed)
 NAME:  Kathryn Banks, MRN:  [id number], DOB:  24-Nov-1955, LOS: 2 ADMISSION DATE:  08/29/2023, CONSULTATION DATE:  08/30/23 REFERRING MD:  Aura Leeds CHIEF COMPLAINT:  COPD, PAH   History of Present Illness:  Kathryn Banks is a 68 year old woman daily smoker with GERD, alcohol abuse, hypertension and COPD who is admitted for acute hypoxemic respiratory failure due to COPD exacerbation.   She reports increased dyspnea and wheezing over past couple of days. She uses 3L of oxygen  at home. Up to 6L here. She is smoking half a pack per day.   CT Chest scan shows no PE. She has enlarged PA trunk. Emphysema. Cirrhosis. Pulmonary fibrosis.   Pertinent  Medical History   Past Medical History:  Diagnosis Date   Acid reflux    Arthritis    FINGERS   COPD (chronic obstructive pulmonary disease) (HCC)    Depression    ETOH abuse    Hepatitis    C-PT STATED SHE WAS TOLD THIS 2014   Hypertension    OFF MEDS FOR FEW YEARS     Significant Hospital Events: Including procedures, antibiotic start and stop dates in addition to other pertinent events     Interim History / Subjective:  Feeling better today Less cough and wheezing  Objective    Blood pressure 97/68, pulse 67, temperature (!) 97.5 F (36.4 C), temperature source Oral, resp. rate 18, height 5\' 4"  (1.626 m), weight 65.2 kg, last menstrual period 09/25/2005, SpO2 98%.        Intake/Output Summary (Last 24 hours) at 08/31/2023 1403 Last data filed at 08/31/2023 1914 Gross per 24 hour  Intake 237 ml  Output --  Net 237 ml   Filed Weights   08/29/23 1920 08/29/23 2150  Weight: 61 kg 65.2 kg    Examination: General: no acute distress, resting in bed HENT: moist mucous membranes, sclera anicteric Lungs: rhonchi RUL Cardiovascular: rrr, no murmurs Abdomen: soft, non-tender, non-distended Extremities: warm, no edema Neuro: alert, oriented, moving all extremities GU: n/a  Echo 10/2022 LV EF 65-70%, grade I diastolic  dysfunction, LA severely dilated, RA severely dilated  Resolved problem list   Assessment and Plan   Acute Hypoxemic Respiratory Failure COPD/Emphysema with Exacerbation Pulmonary nodules - stable Cigarette Smoker Possible Cirrhosis  Plan - continue IV steroids, transition to po steroids tomorrow - Continue brovana , budesonide  and yupelri  with PRN albuterol  nebs - ceftriaxone  + azithromycin  for possible CAP coverage - Continue oxygen  for goal SpO2 92% or higher - f/u RUQ to evaluate for cirrhosis. Check hepatitis panel. - f/u echo  PCCM will continue to follow  Best Practice (right click and "Reselect all SmartList Selections" daily)   Per primary  Labs   CBC: Recent Labs  Lab 08/29/23 1339 08/30/23 0413 08/31/23 0533  WBC 5.7 4.9 4.3  NEUTROABS 3.6  --  3.4  HGB 8.9* 9.6* 8.8*  HCT 30.4* 33.3* 30.7*  MCV 69.9* 69.2* 70.1*  PLT 415* 394 363    Basic Metabolic Panel: Recent Labs  Lab 08/29/23 1339 08/29/23 2120 08/30/23 0413 08/31/23 0533  NA 129*  --  131* 130*  K 3.9  --  4.6 4.4  CL 96*  --  97* 96*  CO2 21*  --  23 22  GLUCOSE 84  --  156* 136*  BUN 15  --  13 22  CREATININE 0.78 0.83 0.79 0.80  CALCIUM  8.3*  --  8.8* 8.8*  MG  --   --   --  1.9  PHOS  --   --   --  4.4   GFR: Estimated Creatinine Clearance: 60.5 mL/min (by C-G formula based on SCr of 0.8 mg/dL). Recent Labs  Lab 08/29/23 1339 08/30/23 0413 08/31/23 0533  WBC 5.7 4.9 4.3    Liver Function Tests: Recent Labs  Lab 08/29/23 1339 08/31/23 0533  AST 44* 32  ALT 22 21  ALKPHOS 131* 112  BILITOT 0.4 0.5  PROT 8.2* 8.3*  ALBUMIN 3.3* 3.3*   No results for input(s): "LIPASE", "AMYLASE" in the last 168 hours. No results for input(s): "AMMONIA" in the last 168 hours.  ABG    Component Value Date/Time   PHART 7.37 12/02/2022 1355   PCO2ART 43 12/02/2022 1355   PO2ART 67 (L) 12/02/2022 1355   HCO3 24.9 12/02/2022 1355   TCO2 24 11/28/2022 2035   ACIDBASEDEF 0.6  12/02/2022 1355   O2SAT 93.3 12/02/2022 1355     Coagulation Profile: No results for input(s): "INR", "PROTIME" in the last 168 hours.  Cardiac Enzymes: No results for input(s): "CKTOTAL", "CKMB", "CKMBINDEX", "TROPONINI" in the last 168 hours.  HbA1C: Hgb A1c MFr Bld  Date/Time Value Ref Range Status  01/31/2019 06:43 PM 5.5 4.8 - 5.6 % Final    Comment:    (NOTE) Pre diabetes:          5.7%-6.4% Diabetes:              >6.4% Glycemic control for   <7.0% adults with diabetes     CBG: No results for input(s): "GLUCAP" in the last 168 hours.     Critical care time: n/a`    Duaine German, MD [facility] Office: [phone]

## 2023-08-31 NOTE — Progress Notes (Signed)
 Physical Therapy Treatment Patient Details Name: Kathryn Banks MRN: [id number] DOB: 1955-12-27 Today's Date: 08/31/2023   History of Present Illness Kathryn Banks is a 68 y.o. female admitted 5/31 with worsening shortness of breath.  At baseline, patient is on 3 L/min of oxygen  at night.  Pt  developed worsening shortness of breath, cough, productive of sputum.  She also reports, exertional dyspnea with minimal effort.  She was hardly able to ambulate.  On admit, requiring 5 L/min of oxygen  and saturating in the low 90s.  CTA chest ruled out PE.  There are findings of pulmonary fibrosis and emphysema.  Indeterminate bilateral lymphadenopathy and prominent mediastinal lymph nodes were noted.   PMH: COPD, long-term smoker, HTN, Cirrhosis of the liver and depression.    PT Comments  Pt reports she is feeling better today than yesterday, still mildly unsteady with ambulation but no overt LOB and pt reports feeling less shaky. On RA pt's O2 sats drop into 80's, remain in low to mid 90's on 3 L O2 with ambulation. Reinforced the need for consistent wearing of supplemental O2 right now. Also brought up rollator again for energy conservation and ability to sit when needed. Pt reports she is not ready for this at this time. Ambulated 100' 2x with 5 min seated rest break in between bouts. PT will continue to follow.     If plan is discharge home, recommend the following: Assistance with cooking/housework   Can travel by Pension scheme manager (4 wheels) (Pt declined need for this)    Recommendations for Other Services       Precautions / Restrictions Precautions Precautions: Fall Recall of Precautions/Restrictions: Intact Restrictions Weight Bearing Restrictions Per Provider Order: No     Mobility  Bed Mobility Overal bed mobility: Independent                  Transfers Overall transfer level: Independent   Transfers: Sit to/from Stand Sit to  Stand: Independent                Ambulation/Gait Ambulation/Gait assistance: Contact guard assist Gait Distance (Feet): 100 Feet (2x) Assistive device: None Gait Pattern/deviations: Step-through pattern, Decreased stride length, Drifts right/left Gait velocity: decreased Gait velocity interpretation: 1.31 - 2.62 ft/sec, indicative of limited community ambulator (cannot increased due to SOB)   General Gait Details: again mentioned the advantages of a rollator for energy conservation and to have a seat when gets SOB but pt still declines and states she's not ready for that yet. She can verbalize the benefits though and encouraged to ask about it when she feels ready   Stairs             Wheelchair Mobility     Tilt Bed    Modified Rankin (Stroke Patients Only)       Balance Overall balance assessment: Independent Sitting-balance support: No upper extremity supported, Feet supported Sitting balance-Leahy Scale: Good     Standing balance support: No upper extremity supported, During functional activity Standing balance-Leahy Scale: Good                              Communication Communication Communication: No apparent difficulties  Cognition Arousal: Alert Behavior During Therapy: WFL for tasks assessed/performed   PT - Cognitive impairments: No apparent impairments  Following commands: Intact      Cueing    Exercises Other Exercises Other Exercises: squats Other Exercises: wall push ups    General Comments General comments (skin integrity, edema, etc.): SPO2 maintained low 90's on 3L O2 with mobility      Pertinent Vitals/Pain Pain Assessment Pain Assessment: No/denies pain    Home Living                          Prior Function            PT Goals (current goals can now be found in the care plan section) Acute Rehab PT Goals Patient Stated Goal: to go home PT Goal Formulation:  With patient Time For Goal Achievement: 09/13/23 Potential to Achieve Goals: Good Progress towards PT goals: Progressing toward goals    Frequency    Min 2X/week      PT Plan      Co-evaluation              AM-PAC PT "6 Clicks" Mobility   Outcome Measure  Help needed turning from your back to your side while in a flat bed without using bedrails?: None Help needed moving from lying on your back to sitting on the side of a flat bed without using bedrails?: None Help needed moving to and from a bed to a chair (including a wheelchair)?: A Little Help needed standing up from a chair using your arms (e.g., wheelchair or bedside chair)?: A Little Help needed to walk in hospital room?: A Little Help needed climbing 3-5 steps with a railing? : A Little 6 Click Score: 20    End of Session Equipment Utilized During Treatment: Gait belt;Oxygen  Activity Tolerance: Patient limited by fatigue Patient left: with call bell/phone within reach;in bed;with bed alarm set Nurse Communication: Mobility status PT Visit Diagnosis: Muscle weakness (generalized) (M62.81);Other abnormalities of gait and mobility (R26.89)     Time: 1610-9604 PT Time Calculation (min) (ACUTE ONLY): 29 min  Charges:    $Gait Training: 23-37 mins PT General Charges $$ ACUTE PT VISIT: 1 Visit                     Amey Ka, PT  Acute Rehab Services Secure chat preferred Office [phone]    Deloris Fetters Andrus Sharp 08/31/2023, 2:25 PM

## 2023-08-31 NOTE — Progress Notes (Signed)
 PROGRESS NOTE    Kathryn Banks  ZOX:[id number] DOB: 02-Jul-1955 DOA: 08/29/2023 PCP: Alexander Anes, MD   Brief Narrative:  Kathryn Banks is a 68 y.o. female with medical history significant of advacned COPD, long-term smoker, HTN, Cirrhosis of the liver and depression.  She presented to ED with worsening shortness of breath since 3 days ago.  At baseline, patient is on 3 L/min of oxygen  at night.  However over the past few days, she has developed worsening shortness of breath, cough, productive of sputum.  She also reports, exertional dyspnea with minimal effort.  She has hardly been unable to ambulate.  On account of the symptoms, patient decided to come to the emergency room.  She is currently requiring 5 L/min of oxygen  and saturating in the low 90s.  Her last admission in the hospital was 1 year prior.   Underwent workup w/ CTA PE Protocol showed no PE but did show findings of pulmonary fibrosis and emphysema as well as indeterminant bilateral lymphadenopathy and prominent mediastinal lymph nodes.  Pulmonary artery was noted to be significant enlarged suggesting pulmonary hypertension.  Pulmonary was consulted for further evaluation and she is on treatment as below.  Undergoing further workup and getting an echocardiogram with bubble study and checking a right upper quadrant ultrasound for her liver cirrhosis.  Liver ultrasound has been done and echocardiogram with bubble is pending.  She is slowly improving and pulmonary feels that she may be able to be discharged tomorrow.  Assessment and Plan:  Acute on Chronic Hypoxic respiratory Failure 2/2 to acute COPD exacerbation with Underlying chronic emphysema and Pulmonary Fibrosis: CT scan ruled out pulm emboli on this admission.  Evidence of enlarged pulmonary artery suggesting worsening pulmonary artery hypertension.  Patient will be optimized with Solu-Medrol  has been continued with plans to transition to p.o. steroids tomorrow, bronchodilators and  antibiotics. Wears 3 L of Supplemental O2 via [place] and up to 6 Liters here.  Patient has no regular follow-up with pulmonology.  She is currently not on any LABA/inhaled corticosteroids.  Patient will be ordered Breztri.  Pulmonology will be consulted to help optimize long term management recommending stopping Breztri and transition to Brovana , budesonide  and Yupelri  with as needed albuterol  nebs for now.  She is getting antibiotic coverage with CAP coverage with IV Ceftriaxone  + Azithromycin  and checking sputum culture. BNP is elevated at 314.3. ECHO + Bubble Study done nad pending read. PT/OT recommending no F/u. Ambulotry home O2 screen done and she did desaturate and needs at least 4 L.  Anticipate discharge in the next 24 to 48 hours  Cirrhosis of the Liver and Hx of Hepatitis C: RUQ U/S  done and showed "Cirrhotic hepatic morphology.  No focal lesion. Mild gallbladder wall thickening is likely related to chronic liver disease.Trace right upper quadrant ascites" and Acute Hepatitis Panel showed Reactive HCV Ab and Will check HCV Quant. LFTs normal. CTM and will need F/u with Gastroenterology   Chronic Tobacco Dependence: Tobacco cessation counseling was again reemphasized.  C/w Nicotine  Patch 14 mg TD   Acute on Chronic Microcytic Anemia: MCV suggesting microcytic anemia.  Hgb/Hct is now trended down to 8.8/30.7 w/ MCV of 70.1. Anemia Panel done and noted an iron level of 20, UIBC of 518, TIBC 538, saturation was 4, ferritin level 12, folate 4.3, vitamin B12 level 878.  Will initiate folic acid  1 mg p.o. daily. Patient has underlying cirrhosis and may have ongoing GI blood loss.  Continue to monitor for  signs and symptoms of bleeding; no overt bleeding noted.  Repeat CBC in a.m.   Elevated Cardiac Markers: Delta troponin however remains flat (60 -> 68).  EKG review shows no acute ischemia.  Elevated cardiac markers may likely be due to demand ischemia from hypoxia. Obtain Echocardiogram to evaluate left  ventricular ejection fraction but getting a Bubble Study as well.  Will consult cardiology if any concerns.   Acute on Chronic Hyponatremia: Patient is asymptomatic at this time. Na+ Trend:  Recent Labs  Lab 08/29/23 1339 08/30/23 0413 08/31/23 0533  NA 129* 131* 130*  -We will follow closely.  Likely related to chronic cirrhosis.   Essential HTN:  C/w Amlodipine  5 mg po Daily and Metoprolol  12.5 mg po BID. C/w IV Hydralazine  5 mg q6hprn HBP. CTM BP per Protocol. Last BP reading was 125/76    Depression/Anxiety: C/w Sertraline  25 mg po at bedtime and Alprazolam  1 mg po qHSprn Sleep  GERD/GI Prophylaxis: C/w PPI with Omeprazole  Substitution of Pantoprazole  40 mg po Daily  Hypoalbuminemia: Patient's Albumin Level is now 3.3. CTM and Trend and repeat CMP in the AM   DVT prophylaxis: heparin injection 5,000 Units Start: 08/29/23 2200 SCDs Start: 08/29/23 2004    Code Status: Full Code Family Communication: No family present @ Bedside  Disposition Plan:  Level of care: Med-Surg Status is: Inpatient Remains inpatient appropriate because: Needs further clinical improvement and clearance by Pulmonary   Consultants:  Pulmonary   Procedures:  As delineated as above;   Antimicrobials:  Anti-infectives (From admission, onward)    Start     Dose/Rate Route Frequency Ordered Stop   08/30/23 1545  azithromycin  (ZITHROMAX ) tablet 500 mg        500 mg Oral Daily 08/30/23 1458 09/04/23 0959   08/29/23 2015  cefTRIAXone  (ROCEPHIN ) 1 g in sodium chloride  0.9 % 100 mL IVPB        1 g 200 mL/hr over 30 Minutes Intravenous Every 24 hours 08/29/23 2003 09/03/23 2014       Subjective: Seen and examined at bedside and thinks she is doing better from a respiratory standpoint.  Continues to remain on oxygen  and desaturated on room air.  Denies any chest pain.  Not coughing up very much sputum.  Denies any other concerns or complaints at this time  Objective: Vitals:   08/31/23 0441 08/31/23  0756 08/31/23 0840 08/31/23 1657  BP: 122/75 97/68  125/76  Pulse: (!) 57 67  71  Resp: 18 18  17   Temp: 97.6 F (36.4 C) (!) 97.5 F (36.4 C)  97.8 F (36.6 C)  TempSrc: Oral Oral    SpO2: 97% 96% 98% 93%  Weight:      Height:        Intake/Output Summary (Last 24 hours) at 08/31/2023 1952 Last data filed at 08/31/2023 1230 Gross per 24 hour  Intake 474 ml  Output --  Net 474 ml   Filed Weights   08/29/23 1920 08/29/23 2150  Weight: 61 kg 65.2 kg   Examination: Physical Exam:  Constitutional: WN/WD older appearing stated age Caucasian female who appears in no acute distress Respiratory: Diminished to auscultation bilaterally with some coarse breath sounds and has some slight crackles rhonchi.  No appreciable wheezing or rales.  Has a normal respiratory effort and is not tachypneic but is on supplemental oxygen  via nasal cannula Cardiovascular: RRR, no murmurs / rubs / gallops. S1 and S2 auscultated.  1+ extremity edema Abdomen: Soft, non-tender, non-distended. Bowel  sounds positive.  GU: Deferred. Musculoskeletal: No clubbing / cyanosis of digits/nails. No joint deformity upper and lower extremities.  Skin: No rashes, lesions, ulcers on a limited skin evaluation. No induration; Warm and dry.  Neurologic: CN 2-12 grossly intact with no focal deficits.  Romberg sign and cerebellar reflexes not assessed.  Psychiatric: Normal judgment and insight. Alert and oriented x 3.   Data Reviewed: I have personally reviewed following labs and imaging studies  CBC: Recent Labs  Lab 08/29/23 1339 08/30/23 0413 08/31/23 0533  WBC 5.7 4.9 4.3  NEUTROABS 3.6  --  3.4  HGB 8.9* 9.6* 8.8*  HCT 30.4* 33.3* 30.7*  MCV 69.9* 69.2* 70.1*  PLT 415* 394 363   Basic Metabolic Panel: Recent Labs  Lab 08/29/23 1339 08/29/23 2120 08/30/23 0413 08/31/23 0533  NA 129*  --  131* 130*  K 3.9  --  4.6 4.4  CL 96*  --  97* 96*  CO2 21*  --  23 22  GLUCOSE 84  --  156* 136*  BUN 15  --  13 22   CREATININE 0.78 0.83 0.79 0.80  CALCIUM  8.3*  --  8.8* 8.8*  MG  --   --   --  1.9  PHOS  --   --   --  4.4   GFR: Estimated Creatinine Clearance: 60.5 mL/min (by C-G formula based on SCr of 0.8 mg/dL). Liver Function Tests: Recent Labs  Lab 08/29/23 1339 08/31/23 0533  AST 44* 32  ALT 22 21  ALKPHOS 131* 112  BILITOT 0.4 0.5  PROT 8.2* 8.3*  ALBUMIN 3.3* 3.3*   No results for input(s): "LIPASE", "AMYLASE" in the last 168 hours. No results for input(s): "AMMONIA" in the last 168 hours. Coagulation Profile: No results for input(s): "INR", "PROTIME" in the last 168 hours. Cardiac Enzymes: No results for input(s): "CKTOTAL", "CKMB", "CKMBINDEX", "TROPONINI" in the last 168 hours. BNP (last 3 results) No results for input(s): "PROBNP" in the last 8760 hours. HbA1C: No results for input(s): "HGBA1C" in the last 72 hours. CBG: No results for input(s): "GLUCAP" in the last 168 hours. Lipid Profile: No results for input(s): "CHOL", "HDL", "LDLCALC", "TRIG", "CHOLHDL", "LDLDIRECT" in the last 72 hours. Thyroid  Function Tests: No results for input(s): "TSH", "T4TOTAL", "FREET4", "T3FREE", "THYROIDAB" in the last 72 hours. Anemia Panel: Recent Labs    08/29/23 2120  VITAMINB12 878  FOLATE 4.3*  FERRITIN 12  TIBC 538*  IRON 20*  RETICCTPCT 1.7   Sepsis Labs: No results for input(s): "PROCALCITON", "LATICACIDVEN" in the last 168 hours.  Recent Results (from the past 240 hours)  Resp panel by RT-PCR (RSV, Flu A&B, Covid) Anterior Nasal Swab     Status: None   Collection Time: 08/29/23  1:39 PM   Specimen: Anterior Nasal Swab  Result Value Ref Range Status   SARS Coronavirus 2 by RT PCR NEGATIVE NEGATIVE Final   Influenza A by PCR NEGATIVE NEGATIVE Final   Influenza B by PCR NEGATIVE NEGATIVE Final    Comment: (NOTE) The Xpert Xpress SARS-CoV-2/FLU/RSV plus assay is intended as an aid in the diagnosis of influenza from Nasopharyngeal swab specimens and should not be  used as a sole basis for treatment. Nasal washings and aspirates are unacceptable for Xpert Xpress SARS-CoV-2/FLU/RSV testing.  Fact Sheet for Patients: BloggerCourse.com  Fact Sheet for Healthcare Providers: SeriousBroker.it  This test is not yet approved or cleared by the [place]  FDA and has been authorized for detection and/or diagnosis of SARS-CoV-2  by FDA under an Emergency Use Authorization (EUA). This EUA will remain in effect (meaning this test can be used) for the duration of the COVID-19 declaration under Section 564(b)(1) of the Act, 21 U.S.C. section 360bbb-3(b)(1), unless the authorization is terminated or revoked.     Resp Syncytial Virus by PCR NEGATIVE NEGATIVE Final    Comment: (NOTE) Fact Sheet for Patients: BloggerCourse.com  Fact Sheet for Healthcare Providers: SeriousBroker.it  This test is not yet approved or cleared by the [place]  FDA and has been authorized for detection and/or diagnosis of SARS-CoV-2 by FDA under an Emergency Use Authorization (EUA). This EUA will remain in effect (meaning this test can be used) for the duration of the COVID-19 declaration under Section 564(b)(1) of the Act, 21 U.S.C. section 360bbb-3(b)(1), unless the authorization is terminated or revoked.  Performed at [facility], [address], [place], [place] [address]   Culture, blood (routine x 2)     Status: None (Preliminary result)   Collection Time: 08/29/23  1:39 PM   Specimen: BLOOD RIGHT FOREARM  Result Value Ref Range Status   Specimen Description BLOOD RIGHT FOREARM  Final   Special Requests   Final    BOTTLES DRAWN AEROBIC AND ANAEROBIC Blood Culture adequate volume   Culture   Final    NO GROWTH 2 DAYS Performed at [facility], [address] [place], [place] [address]    Report Status PENDING  Incomplete  Culture, blood (routine x  2)     Status: None (Preliminary result)   Collection Time: 08/29/23  1:44 PM   Specimen: BLOOD RIGHT ARM  Result Value Ref Range Status   Specimen Description BLOOD RIGHT ARM  Final   Special Requests   Final    BOTTLES DRAWN AEROBIC AND ANAEROBIC Blood Culture adequate volume   Culture   Final    NO GROWTH 2 DAYS Performed at [facility], [address], [place], [place] [address]    Report Status PENDING  Incomplete    Radiology Studies: US  Abdomen Limited RUQ (LIVER/GB) Result Date: 08/31/2023 CLINICAL DATA:  Cirrhosis. EXAM: ULTRASOUND ABDOMEN LIMITED RIGHT UPPER QUADRANT COMPARISON:  11/28/2022 FINDINGS: Gallbladder: Physiologically distended. No gallstones. Mild wall thickening at 3.7 mm. No sonographic Murphy sign noted by sonographer. Common bile duct: Diameter: 5 mm. Liver: Coarsened echotexture with nodular contours consistent with cirrhosis. No evidence of focal lesion. Portal vein is patent on color Doppler imaging with normal direction of blood flow towards the liver. Other: Trace right upper quadrant ascites. IMPRESSION: 1. Cirrhotic hepatic morphology.  No focal lesion. 2. Mild gallbladder wall thickening is likely related to chronic liver disease. 3. Trace right upper quadrant ascites. Electronically Signed   By: Chadwick Colonel M.D.   On: 08/31/2023 18:36   Scheduled Meds:  diphenhydrAMINE   25 mg Oral QHS   And   acetaminophen   500 mg Oral QHS   amLODipine   5 mg Oral Daily   arformoterol   15 mcg Nebulization BID   azithromycin   500 mg Oral Daily   budesonide  (PULMICORT ) nebulizer solution  0.5 mg Nebulization BID   cyanocobalamin   1,000 mcg Oral Daily   furosemide   40 mg Oral Daily   guaiFENesin   600 mg Oral BID   heparin  5,000 Units Subcutaneous Q8H   methylPREDNISolone  (SOLU-MEDROL ) injection  40 mg Intravenous Q12H   Followed by   Cecily Cohen ON 09/02/2023] predniSONE   40 mg Oral Q breakfast   metoprolol  tartrate  12.5  mg Oral BID   nicotine   14 mg Transdermal  Daily   pantoprazole   40 mg Oral Daily   polyethylene glycol  17 g Oral BID   revefenacin   175 mcg Nebulization Daily   senna-docusate  1 tablet Oral BID   sertraline   25 mg Oral QHS   spironolactone   25 mg Oral Daily   Continuous Infusions:  cefTRIAXone  (ROCEPHIN )  IV 1 g (08/30/23 2209)    LOS: 2 days   Aura Leeds, DO [facility] Available via Epic secure chat 7am-7pm After these hours, please refer to coverage provider listed on [email or web address] 08/31/2023, 7:52 PM

## 2023-08-31 NOTE — TOC Progression Note (Signed)
 Transition of Care [facility]) - Progression Note    Patient Details  Name: Kathryn Banks MRN: [id number] Date of Birth: 09/12/1955  [facility]) CM/SW Contact  Jeani Mill, RN Phone Number: 08/31/2023, 1:19 PM  Clinical Narrative:    Patient had home 02 from [place] but due to insurance [employer] is no longer in network. Patient bought home 02 out of pocket but the machine no longer works.  Patient is agreeable to use [employer] for home 02 and shower chair.  Patient declines walker at this time.  Address, Phone number and PCP verified.    Expected Discharge Plan: Home/Self Care Barriers to Discharge: Continued Medical Work up  Expected Discharge Plan and Services   Discharge Planning Services: CM Consult Post Acute Care Choice: Durable Medical Equipment Living arrangements for the past 2 months: Single Family Home                 DME Arranged: Oxygen , Shower stool DME Agency: [employer] Date DME Agency Contacted: 08/31/23 Time DME Agency Contacted: 1023 Representative spoke with at DME Agency: Harriet Limber             Social Determinants of Health (SDOH) Interventions SDOH Screenings   Food Insecurity: No Food Insecurity (08/30/2023)  Housing: Low Risk  (08/30/2023)  Transportation Needs: No Transportation Needs (08/30/2023)  Utilities: Not At Risk (08/30/2023)  Financial Resource Strain: Medium Risk (01/23/2023)   Received from [facility]  Social Connections: Moderately Integrated (08/30/2023)  Tobacco Use: High Risk (08/29/2023)    Readmission Risk Interventions     No data to display

## 2023-08-31 NOTE — Progress Notes (Signed)
  Kathryn Banks, PT  Physical Therapist Physical Therapy   Progress Notes    Signed   Date of Service: 08/30/2023 11:50 AM   Signed      SATURATION QUALIFICATIONS: (This note is used to comply with regulatory documentation for home oxygen )   Patient Saturations on Room Air at Rest = 83%   Patient Saturations on Room Air while Ambulating = NT as desat on RA at rest   Patient Saturations on 4 Liters of oxygen  while Ambulating = 88%   Please briefly explain why patient needs home oxygen :Pt requiring 4LO2 at rest and with activity to keep O2 saturation > 88%.     Dawn M,PT Acute Rehab Services [phone]

## 2023-08-31 NOTE — Progress Notes (Signed)
 Brief Nutrition Note  RD consulted as part of the COPD Protocol order set.  68 year old female who presented to the ED on 5/31 with SOB. PMH of COPD, tobacco abuse, HTN, cirrhosis, depression, anxiety, alcohol abuse. Pt admitted with COPD exacerbation.  Wt Readings from Last 15 Encounters:  08/29/23 65.2 kg  01/14/23 60.5 kg  12/12/22 61.4 kg  12/05/22 61.4 kg  02/01/19 66.7 kg  10/15/17 66.2 kg  10/13/17 67.1 kg  09/08/16 54.4 kg  04/20/16 59.9 kg  09/26/15 62.7 kg  08/31/15 54.4 kg    Body mass index is 24.67 kg/m. Patient meets criteria for normal weight based on current BMI. Weight stable/trending up over the last 9 months.  Current diet order is 2 gram Sodium, patient is consuming approximately 75-100% of meals at this time. Labs and medications reviewed.  No nutrition interventions warranted at this time. If nutrition issues arise, please re-consult RD.    Ernestina Headland, MS, RD, LDN Registered Dietitian II Please see AMiON for contact information.

## 2023-08-31 NOTE — Progress Notes (Signed)
 Echocardiogram 2D Echocardiogram has been performed.  Kathryn Banks 08/31/2023, 5:57 PM

## 2023-09-01 ENCOUNTER — Inpatient Hospital Stay (HOSPITAL_COMMUNITY)

## 2023-09-01 ENCOUNTER — Telehealth: Payer: Self-pay | Admitting: Pulmonary Disease

## 2023-09-01 DIAGNOSIS — J441 Chronic obstructive pulmonary disease with (acute) exacerbation: Secondary | ICD-10-CM | POA: Diagnosis not present

## 2023-09-01 DIAGNOSIS — B1920 Unspecified viral hepatitis C without hepatic coma: Secondary | ICD-10-CM

## 2023-09-01 DIAGNOSIS — K7460 Unspecified cirrhosis of liver: Secondary | ICD-10-CM | POA: Diagnosis not present

## 2023-09-01 DIAGNOSIS — J9621 Acute and chronic respiratory failure with hypoxia: Secondary | ICD-10-CM | POA: Diagnosis not present

## 2023-09-01 LAB — CBC WITH DIFFERENTIAL/PLATELET
Abs Immature Granulocytes: 0.04 10*3/uL (ref 0.00–0.07)
Basophils Absolute: 0 10*3/uL (ref 0.0–0.1)
Basophils Relative: 0 %
Eosinophils Absolute: 0 10*3/uL (ref 0.0–0.5)
Eosinophils Relative: 0 %
HCT: 32.5 % — ABNORMAL LOW (ref 36.0–46.0)
Hemoglobin: 9.4 g/dL — ABNORMAL LOW (ref 12.0–15.0)
Immature Granulocytes: 1 %
Lymphocytes Relative: 16 %
Lymphs Abs: 1.2 10*3/uL (ref 0.7–4.0)
MCH: 20.1 pg — ABNORMAL LOW (ref 26.0–34.0)
MCHC: 28.9 g/dL — ABNORMAL LOW (ref 30.0–36.0)
MCV: 69.6 fL — ABNORMAL LOW (ref 80.0–100.0)
Monocytes Absolute: 0.3 10*3/uL (ref 0.1–1.0)
Monocytes Relative: 5 %
Neutro Abs: 5.7 10*3/uL (ref 1.7–7.7)
Neutrophils Relative %: 78 %
Platelets: 441 10*3/uL — ABNORMAL HIGH (ref 150–400)
RBC: 4.67 MIL/uL (ref 3.87–5.11)
RDW: 20.4 % — ABNORMAL HIGH (ref 11.5–15.5)
WBC: 7.3 10*3/uL (ref 4.0–10.5)
nRBC: 0 % (ref 0.0–0.2)

## 2023-09-01 LAB — COMPREHENSIVE METABOLIC PANEL WITH GFR
ALT: 25 U/L (ref 0–44)
AST: 36 U/L (ref 15–41)
Albumin: 3.5 g/dL (ref 3.5–5.0)
Alkaline Phosphatase: 111 U/L (ref 38–126)
Anion gap: 9 (ref 5–15)
BUN: 25 mg/dL — ABNORMAL HIGH (ref 8–23)
CO2: 24 mmol/L (ref 22–32)
Calcium: 8.9 mg/dL (ref 8.9–10.3)
Chloride: 101 mmol/L (ref 98–111)
Creatinine, Ser: 0.94 mg/dL (ref 0.44–1.00)
GFR, Estimated: >60 mL/min (ref >60)
Glucose, Bld: 124 mg/dL — ABNORMAL HIGH (ref 70–99)
Potassium: 5 mmol/L (ref 3.5–5.1)
Sodium: 134 mmol/L — ABNORMAL LOW (ref 135–145)
Total Bilirubin: 0.6 mg/dL (ref 0.0–1.2)
Total Protein: 8.4 g/dL — ABNORMAL HIGH (ref 6.5–8.1)

## 2023-09-01 LAB — PHOSPHORUS: Phosphorus: 5 mg/dL — ABNORMAL HIGH (ref 2.5–4.6)

## 2023-09-01 LAB — MAGNESIUM: Magnesium: 2.1 mg/dL (ref 1.7–2.4)

## 2023-09-01 MED ORDER — AZITHROMYCIN 500 MG PO TABS: 500.0000 mg | ORAL_TABLET | Freq: Every day | ORAL | 0 refills | Status: AC

## 2023-09-01 MED ORDER — BREZTRI AEROSPHERE 160-9-4.8 MCG/ACT IN AERO: 2.0000 | INHALATION_SPRAY | Freq: Two times a day (BID) | RESPIRATORY_TRACT | 0 refills | Status: AC

## 2023-09-01 MED ORDER — ONDANSETRON HCL 4 MG PO TABS: 4.0000 mg | ORAL_TABLET | Freq: Four times a day (QID) | ORAL | 0 refills | Status: AC | PRN

## 2023-09-01 MED ORDER — SPIRONOLACTONE 25 MG PO TABS: 25.0000 mg | ORAL_TABLET | Freq: Every day | ORAL | 0 refills | Status: DC

## 2023-09-01 MED ORDER — POLYETHYLENE GLYCOL 3350 17 G PO PACK: 17.0000 g | PACK | Freq: Every day | ORAL | 0 refills | Status: DC

## 2023-09-01 MED ORDER — PREDNISONE 10 MG PO TABS: 20.0000 mg | ORAL_TABLET | Freq: Every day | ORAL | Status: DC

## 2023-09-01 MED ORDER — PREDNISONE 10 MG PO TABS: 40.0000 mg | ORAL_TABLET | Freq: Every day | ORAL | Status: DC

## 2023-09-01 MED ORDER — CEFDINIR 300 MG PO CAPS: 300.0000 mg | ORAL_CAPSULE | Freq: Two times a day (BID) | ORAL | 0 refills | Status: AC

## 2023-09-01 MED ORDER — SENNOSIDES-DOCUSATE SODIUM 8.6-50 MG PO TABS: 1.0000 | ORAL_TABLET | Freq: Every day | ORAL | 0 refills | Status: AC

## 2023-09-01 MED ORDER — PREDNISONE 10 MG PO TABS: 10.0000 mg | ORAL_TABLET | Freq: Every day | ORAL | Status: DC

## 2023-09-01 MED ORDER — BENZONATATE 100 MG PO CAPS: 100.0000 mg | ORAL_CAPSULE | Freq: Three times a day (TID) | ORAL | 0 refills | Status: DC | PRN

## 2023-09-01 MED ORDER — ALBUTEROL SULFATE (2.5 MG/3ML) 0.083% IN NEBU: 2.5000 mg | INHALATION_SOLUTION | RESPIRATORY_TRACT | 12 refills | Status: AC | PRN

## 2023-09-01 MED ORDER — PREDNISONE 10 MG PO TABS: ORAL_TABLET | ORAL | 0 refills | Status: AC

## 2023-09-01 MED ORDER — GUAIFENESIN ER 600 MG PO TB12: 600.0000 mg | ORAL_TABLET | Freq: Two times a day (BID) | ORAL | 0 refills | Status: AC

## 2023-09-01 NOTE — Telephone Encounter (Signed)
 Please arrange hospital follow up for COPD exacerbation in 2-6 weeks with me.  Thanks, JD

## 2023-09-01 NOTE — TOC Transition Note (Signed)
 Transition of Care [facility]) - Discharge Note   Patient Details  Name: Kathryn Banks MRN: [id number] Date of Birth: 10-29-1955  Transition of Care [facility]) CM/SW Contact:  Jeani Mill, RN Phone Number: 09/01/2023, 4:23 PM   Clinical Narrative:    Patient stable for discharge.  DME will be delivered to room prior to discharge.  No other TOC needs.    Final next level of care: Home/Self Care Barriers to Discharge: Barriers Resolved   Patient Goals and CMS Choice Patient states their goals for this hospitalization and ongoing recovery are:: to return home CMS Medicare.gov Compare Post Acute Care list provided to:: Patient Choice offered to / list presented to : Patient      Discharge Placement               Home        Discharge Plan and Services Additional resources added to the After Visit Summary for     Discharge Planning Services: CM Consult Post Acute Care Choice: Durable Medical Equipment          DME Arranged: Oxygen , Shower stool DME Agency: [employer] Date DME Agency Contacted: 08/31/23 Time DME Agency Contacted: 1023 Representative spoke with at DME Agency: Mitch            Social Drivers of Health (SDOH) Interventions SDOH Screenings   Food Insecurity: No Food Insecurity (08/30/2023)  Housing: Low Risk  (08/30/2023)  Transportation Needs: No Transportation Needs (08/30/2023)  Utilities: Not At Risk (08/30/2023)  Financial Resource Strain: Medium Risk (01/23/2023)   Received from [facility]  Social Connections: Moderately Integrated (08/30/2023)  Tobacco Use: High Risk (08/29/2023)     Readmission Risk Interventions    09/01/2023    4:22 PM  Readmission Risk Prevention Plan  Transportation Screening Complete  PCP or Specialist Appt within 5-7 Days Complete  Home Care Screening Complete  Medication Review (RN CM) Complete

## 2023-09-01 NOTE — Plan of Care (Signed)

## 2023-09-01 NOTE — Progress Notes (Signed)
 Mobility Specialist Progress Note:   09/01/23 1000  Oxygen  Therapy  O2 Device Nasal Cannula  O2 Flow Rate (L/min) 3 L/min  Mobility  Activity Ambulated independently in hallway  Level of Assistance Standby assist, set-up cues, supervision of patient - no hands on  Assistive Device None  Distance Ambulated (ft) 120 ft  Activity Response Tolerated well  Mobility Referral Yes  Mobility visit 1 Mobility  Mobility Specialist Start Time (ACUTE ONLY) [id number]  Mobility Specialist Stop Time (ACUTE ONLY) 0911  Mobility Specialist Time Calculation (min) (ACUTE ONLY) 5 min    During Mobility: 90% SpO2 Post Mobility:   95% SpO2  Pt received in bed and agreeable. Required no physical assistance throughout. Asymptomatic w/ no complaints. VSS on 3L O2. Pt left in bed with call bell and all needs met.  D'Vante Nolon Baxter Mobility Specialist Please contact via Special educational needs teacher or Rehab office at [phone]

## 2023-09-01 NOTE — Care Management Important Message (Signed)
 Important Message  Patient Details  Name: Kathryn Banks MRN: [id number] Date of Birth: May 18, 1955   Important Message Given:  Yes - Medicare IM     Wynonia Hedges 09/01/2023, 3:09 PM

## 2023-09-01 NOTE — Telephone Encounter (Signed)
 Hello Dr. Diania Fortes- No appts avail until your sched comes out. Let us  know if you'd like to double book. Otherwise we will contact PT when we get the schedules. (No NP appts either.)Thanks.

## 2023-09-01 NOTE — Progress Notes (Addendum)
 NAME:  Kathryn Banks, MRN:  [id number], DOB:  1955-12-12, LOS: 3 ADMISSION DATE:  08/29/2023, CONSULTATION DATE:  08/30/23 REFERRING MD:  Aura Leeds CHIEF COMPLAINT:  COPD, PAH   History of Present Illness:  Kathryn Banks is a 67 year old woman daily smoker with GERD, alcohol abuse, hypertension and COPD who is admitted for acute hypoxemic respiratory failure due to COPD exacerbation.   She reports increased dyspnea and wheezing over past couple of days. She uses 3L of oxygen  at home. Up to 6L here. She is smoking half a pack per day.   CT Chest scan shows no PE. She has enlarged PA trunk. Emphysema. Cirrhosis. Pulmonary fibrosis.   Pertinent  Medical History   Past Medical History:  Diagnosis Date   Acid reflux    Arthritis    FINGERS   COPD (chronic obstructive pulmonary disease) (HCC)    Depression    ETOH abuse    Hepatitis    C-PT STATED SHE WAS TOLD THIS 2014   Hypertension    OFF MEDS FOR FEW YEARS     Significant Hospital Events: Including procedures, antibiotic start and stop dates in addition to other pertinent events     Interim History / Subjective:  Feeling better today Less cough and wheezing  Objective    Blood pressure 102/65, pulse 67, temperature 97.8 F (36.6 C), temperature source Oral, resp. rate 18, height 5\' 4"  (1.626 m), weight 65.2 kg, last menstrual period 09/25/2005, SpO2 96%.        Intake/Output Summary (Last 24 hours) at 09/01/2023 1037 Last data filed at 08/31/2023 1230 Gross per 24 hour  Intake 237 ml  Output --  Net 237 ml   Filed Weights   08/29/23 1920 08/29/23 2150  Weight: 61 kg 65.2 kg    Examination: Awake alert currently on 3 L nasal cannula sats 94% No JVD lymphadenopathy is appreciated Decreased breath sounds in the bases Heart sounds are regular Abdomen protuberant Extremities without edema  Echo 10/2022 LV EF 65-70%, grade I diastolic dysfunction, LA severely dilated, RA severely dilated  Resolved problem list    Assessment and Plan   Acute Hypoxemic Respiratory Failure COPD/Emphysema with Exacerbation Pulmonary nodules - stable Cigarette Smoker Possible Cirrhosis  Plan Transition to p.o. prednisone  with taper Continue bronchodilators and antibiotics She is home O2 dependent at 3 L and currently is at 3 L 2D echo is pending Abdominal ultrasound demonstrates some changes of cirrhosis    Best Practice (right click and "Reselect all SmartList Selections" daily)   Per primary  Labs   CBC: Recent Labs  Lab 08/29/23 1339 08/30/23 0413 08/31/23 0533 09/01/23 0551  WBC 5.7 4.9 4.3 7.3  NEUTROABS 3.6  --  3.4 5.7  HGB 8.9* 9.6* 8.8* 9.4*  HCT 30.4* 33.3* 30.7* 32.5*  MCV 69.9* 69.2* 70.1* 69.6*  PLT 415* 394 363 441*    Basic Metabolic Panel: Recent Labs  Lab 08/29/23 1339 08/29/23 2120 08/30/23 0413 08/31/23 0533 09/01/23 0551  NA 129*  --  131* 130* 134*  K 3.9  --  4.6 4.4 5.0  CL 96*  --  97* 96* 101  CO2 21*  --  23 22 24   GLUCOSE 84  --  156* 136* 124*  BUN 15  --  13 22 25*  CREATININE 0.78 0.83 0.79 0.80 0.94  CALCIUM  8.3*  --  8.8* 8.8* 8.9  MG  --   --   --  1.9 2.1  PHOS  --   --   --  4.4 5.0*   GFR: Estimated Creatinine Clearance: 51.5 mL/min (by C-G formula based on SCr of 0.94 mg/dL). Recent Labs  Lab 08/29/23 1339 08/30/23 0413 08/31/23 0533 09/01/23 0551  WBC 5.7 4.9 4.3 7.3    Liver Function Tests: Recent Labs  Lab 08/29/23 1339 08/31/23 0533 09/01/23 0551  AST 44* 32 36  ALT 22 21 25   ALKPHOS 131* 112 111  BILITOT 0.4 0.5 0.6  PROT 8.2* 8.3* 8.4*  ALBUMIN 3.3* 3.3* 3.5   No results for input(s): "LIPASE", "AMYLASE" in the last 168 hours. No results for input(s): "AMMONIA" in the last 168 hours.  ABG    Component Value Date/Time   PHART 7.37 12/02/2022 1355   PCO2ART 43 12/02/2022 1355   PO2ART 67 (L) 12/02/2022 1355   HCO3 24.9 12/02/2022 1355   TCO2 24 11/28/2022 2035   ACIDBASEDEF 0.6 12/02/2022 1355   O2SAT 93.3  12/02/2022 1355     Coagulation Profile: No results for input(s): "INR", "PROTIME" in the last 168 hours.  Cardiac Enzymes: No results for input(s): "CKTOTAL", "CKMB", "CKMBINDEX", "TROPONINI" in the last 168 hours.  HbA1C: Hgb A1c MFr Bld  Date/Time Value Ref Range Status  01/31/2019 06:43 PM 5.5 4.8 - 5.6 % Final    Comment:    (NOTE) Pre diabetes:          5.7%-6.4% Diabetes:              >6.4% Glycemic control for   <7.0% adults with diabetes     CBG: No results for input(s): "GLUCAP" in the last 168 hours.     Critical care time: n/a`   Siegfried Dress Atlantis Delong ACNP Acute Care Nurse Practitioner Jonny Neu Pulmonary/Critical Care Please consult Amion 09/01/2023, 10:37 AM

## 2023-09-01 NOTE — Discharge Summary (Signed)
 Physician Discharge Summary   Patient: Kathryn Banks MRN: [id number] DOB: 08-09-1955  Admit date:     08/29/2023  Discharge date: 09/01/23  Discharge Physician: Aura Leeds, DO   PCP: Alexander Anes, MD   Recommendations at discharge:   Follow-up with PCP within 1 to 2 weeks repeat CBC, CMP, mag, Phos within 1 week Follow-up with pulmonary in outpatient setting within 1 to 2 weeks Follow-up with gastroenterology in the outpatient setting for further evaluation of liver cirrhosis Follow-up with infectious disease in outpatient setting for further evaluation of chronic hepatitis C  Discharge Diagnoses: Principal Problem:   COPD with acute exacerbation (HCC)  Resolved Problems:   * No resolved hospital problems. *  Hospital Course: Kathryn Banks is a 68 y.o. female with medical history significant of advacned COPD, long-term smoker, HTN, Cirrhosis of the liver and depression.  She presented to ED with worsening shortness of breath since 3 days ago.  At baseline, patient is on 3 L/min of oxygen  at night.  However over the past few days, she has developed worsening shortness of breath, cough, productive of sputum.  She also reports, exertional dyspnea with minimal effort.  She has hardly been unable to ambulate.  On account of the symptoms, patient decided to come to the emergency room.  She is currently requiring 5 L/min of oxygen  and saturating in the low 90s.  Her last admission in the hospital was 1 year prior.   Underwent workup w/ CTA PE Protocol showed no PE but did show findings of pulmonary fibrosis and emphysema as well as indeterminant bilateral lymphadenopathy and prominent mediastinal lymph nodes.  Pulmonary artery was noted to be significant enlarged suggesting pulmonary hypertension.  Pulmonary was consulted for further evaluation and she is on treatment as below.  Undergoing further workup and getting an echocardiogram with bubble study and checking a right upper quadrant  ultrasound for her liver cirrhosis.  Liver ultrasound has been done and echocardiogram with bubble done as well and showed an EF of 65 to 70% and grade 1 diastolic dysfunction.  Pulmonary made some further adjustments and transition to p.o. prednisone  taper as well as oral antibiotics and she is deemed medically stable for discharge at this time will need follow-up with PCP, pulmonary, gastroenterology, infectious diseases in outpatient setting.  She will be going home on supplemental oxygen .  Assessment and Plan:  Acute on Chronic Hypoxic respiratory Failure 2/2 to acute COPD exacerbation with Underlying chronic emphysema and Pulmonary Fibrosis: CT scan ruled out pulm emboli on this admission.  Evidence of enlarged pulmonary artery suggesting worsening pulmonary artery hypertension.  Patient will be optimized with Solu-Medrol  has been continued with plans to transition to p.o. steroids tomorrow, bronchodilators and antibiotics. Wears 3 L of Supplemental O2 via [place] and up to 6 Liters here.  Patient has no regular follow-up with pulmonology.  She is currently not on any LABA/inhaled corticosteroids.  Patient will be ordered Breztri.  Pulmonology will be consulted to help optimize long term management recommending stopping Breztri and transition to Brovana , budesonide  and Yupelri  with as needed albuterol  nebs for now.  Pulmonary now recommends continuing Breztri at discharge She was getting antibiotic coverage with CAP coverage with IV Ceftriaxone  + Azithromycin  and checking sputum culture. BNP is elevated at 314.3. ECHO + Bubble Study done nad pending read. PT/OT recommending no F/u. Ambulotry home O2 screen done and she did desaturate and needs at least 4 L.  She did well and pulmonary cleared her.  They transitioned her to p.o. prednisone  taper and recommended continuing antibiotics for 5 days total.  Cirrhosis of the Liver and Hx of Hepatitis C: RUQ U/S  done and showed "Cirrhotic hepatic morphology.  No  focal lesion. Mild gallbladder wall thickening is likely related to chronic liver disease.Trace right upper quadrant ascites" and Acute Hepatitis Panel showed Reactive HCV Ab and Will check HCV Quant and this is pending. LFTs normal. CTM and will need F/u with Gastroenterology in the outpatient setting  Chronic Tobacco Dependence: Tobacco cessation counseling was again reemphasized.  C/w Nicotine  Patch 14 mg TD   Acute on Chronic Microcytic Anemia: MCV suggesting microcytic anemia.  Hgb/Hct is now stable at 9.4/32.5 w/ MCV of 69.6. Anemia Panel done and noted an iron level of 20, UIBC of 518, TIBC 538, saturation was 4, ferritin level 12, folate 4.3, vitamin B12 level 878.  Will initiate folic acid  1 mg p.o. daily. Patient has underlying cirrhosis and may have ongoing GI blood loss.  Continue to monitor for signs and symptoms of bleeding; no overt bleeding noted.  Repeat CBC in a.m.   Elevated Cardiac Markers: Delta troponin however remains flat (60 -> 68).  EKG review shows no acute ischemia.  Elevated cardiac markers may likely be due to demand ischemia from hypoxia. Obtain Echocardiogram to evaluate left ventricular ejection fraction but getting a Bubble Study as well.  Will consult cardiology if any concerns but she is stable and echocardiogram showed EF 65 to 70%.  Can be followed in the outpatient setting  with cardiology   Acute on Chronic Hyponatremia: Patient is asymptomatic at this time. Na+ Trend:  Recent Labs  Lab 08/29/23 1339 08/30/23 0413 08/31/23 0533 09/01/23 0551  NA 129* 131* 130* 134*  -We will follow closely.  Likely related to chronic cirrhosis.   Essential HTN:  C/w Amlodipine  5 mg po Daily and Metoprolol  12.5 mg po BID. C/w IV Hydralazine  5 mg q6hprn HBP. CTM BP per Protocol. Last BP reading was 125/76    Depression/Anxiety: C/w Sertraline  25 mg po at bedtime and Alprazolam  1 mg po qHSprn Sleep  GERD/GI Prophylaxis: C/w PPI with Omeprazole  Substitution of Pantoprazole  40  mg po Daily  Hypoalbuminemia: Patient's Albumin Level is now 3.5. CTM and Trend and repeat CMP in the AM  Consultants: Pulmonary Procedures performed: As delineated as above   Disposition: Home Diet recommendation:  Discharge Diet Orders (From admission, onward)     Start     Ordered   09/01/23 0000  Diet - low sodium heart healthy       Comments: 2 Gram Sodium Diet   09/01/23 1612           Cardiac diet - 2 Gram Sodium Diet  DISCHARGE MEDICATION: Allergies as of 09/01/2023   No Known Allergies      Medication List     STOP taking these medications    Tiotropium Bromide Monohydrate 2.5 MCG/ACT Aers       TAKE these medications    albuterol  (2.5 MG/3ML) 0.083% nebulizer solution Commonly known as: PROVENTIL  Take 3 mLs (2.5 mg total) by nebulization every 2 (two) hours as needed for wheezing.   ALPRAZolam  1 MG tablet Commonly known as: XANAX  Take 1 mg by mouth as needed for anxiety.   amLODipine  5 MG tablet Commonly known as: NORVASC  Take 1 tablet (5 mg total) by mouth daily. What changed: when to take this   azithromycin  500 MG tablet Commonly known as: ZITHROMAX  Take 1 tablet (500  mg total) by mouth daily for 3 days. Start taking on: September 02, 2023   baclofen 20 MG tablet Commonly known as: LIORESAL Take 20 mg by mouth 3 (three) times daily as needed for muscle spasms.   benzonatate  100 MG capsule Commonly known as: TESSALON  Take 1 capsule (100 mg total) by mouth 3 (three) times daily as needed for cough.   Breztri Aerosphere 160-9-4.8 MCG/ACT Aero inhaler Generic drug: budesonide -glycopyrrolate-formoterol  Inhale 2 puffs into the lungs in the morning and at bedtime.   cefdinir 300 MG capsule Commonly known as: OMNICEF Take 1 capsule (300 mg total) by mouth 2 (two) times daily for 3 days.   cyanocobalamin  1000 MCG tablet Commonly known as: VITAMIN B12 Take 1,000 mcg by mouth in the morning.   diphenhydramine -acetaminophen  25-500 MG Tabs  tablet Commonly known as: TYLENOL  PM Take 1 tablet by mouth at bedtime.   furosemide  40 MG tablet Commonly known as: LASIX  Take 1 tablet (40 mg total) by mouth daily. What changed: when to take this   guaiFENesin  600 MG 12 hr tablet Commonly known as: MUCINEX  Take 1 tablet (600 mg total) by mouth 2 (two) times daily for 5 days.   MAGNESIUM  OXIDE 400 PO Take 1 tablet by mouth in the morning and at bedtime.   metoprolol  tartrate 25 MG tablet Commonly known as: LOPRESSOR  Take 0.5 tablets (12.5 mg total) by mouth 2 (two) times daily.   nicotine  21 mg/24hr patch Commonly known as: NICODERM CQ  - dosed in mg/24 hours Place 21 mg onto the skin as needed.   omeprazole  20 MG tablet Commonly known as: PRILOSEC  OTC Take 20 mg by mouth in the morning.   ondansetron  4 MG tablet Commonly known as: ZOFRAN  Take 1 tablet (4 mg total) by mouth every 6 (six) hours as needed for nausea.   polyethylene glycol 17 g packet Commonly known as: MIRALAX / GLYCOLAX Take 17 g by mouth daily.   predniSONE  10 MG tablet Commonly known as: DELTASONE  Take 4 tablets (40 mg total) by mouth daily with breakfast for 3 days, THEN 2 tablets (20 mg total) daily with breakfast for 3 days, THEN 1 tablet (10 mg total) daily with breakfast for 3 days. Start taking on: September 02, 2023   senna-docusate 8.6-50 MG tablet Commonly known as: Senokot-S Take 1 tablet by mouth at bedtime.   sertraline  25 MG tablet Commonly known as: ZOLOFT  Take 1 tablet (25 mg total) by mouth at bedtime.   spironolactone  25 MG tablet Commonly known as: ALDACTONE  Take 1 tablet (25 mg total) by mouth daily. Start taking on: September 02, 2023               Durable Medical Equipment  (From admission, onward)           Start     Ordered   08/31/23 1025  For home use only DME Other see comment  Once       Comments: Shower chair  Question:  Length of Need  Answer:  Lifetime   08/31/23 1024   08/31/23 1022  For home use only DME  oxygen   Once       Question Answer Comment  Length of Need Lifetime   Mode or (Route) Nasal cannula   Liters per Minute 4   Frequency Continuous (stationary and portable oxygen  unit needed)   Oxygen  conserving device Yes   Oxygen  delivery system Gas      08/31/23 1021  Follow-up Information     [facility]  Follow up.   Why: Home oxygen  Contact information: [address] [place] [address] [phone]         Alexander Anes, MD Follow up in 5 day(s).   Specialty: Family Medicine Why: Hospital follow up Contact information: [address] [place] [address] [phone]                Discharge Exam: Cleavon Curls Weights   08/29/23 1920 08/29/23 2150  Weight: 61 kg 65.2 kg   Vitals:   09/01/23 0820 09/01/23 1705  BP:  (!) 153/83  Pulse: 67 71  Resp: 18   Temp:  (!) 97.5 F (36.4 C)  SpO2: 96% 90%   Examination: Physical Exam:  Constitutional: WN/WD chronically ill-appearing Caucasian female in no acute distress appears calm Respiratory: Diminished to auscultation bilaterally with some coarse breath sounds and some rhonchi but no appreciable wheezing or rails. Normal respiratory effort and patient is not tachypenic. No accessory muscle use.  Wearing supplemental oxygen  via nasal cannula Cardiovascular: RRR, no murmurs / rubs / gallops. S1 and S2 auscultated.  Minimal extremity edema.  Abdomen: Soft, non-tender, non-distended. Bowel sounds positive.  GU: Deferred. Musculoskeletal: No clubbing / cyanosis of digits/nails. No joint deformity upper and lower extremities.  Skin: No rashes, lesions, ulcers on limited skin evaluation. No induration; Warm and dry.  Neurologic: CN 2-12 grossly intact with no focal deficits. Romberg sign and cerebellar reflexes not assessed.  Psychiatric: Normal judgment and insight. Alert and oriented x 3. Normal mood and appropriate affect.   Condition at discharge: stable  The results of  significant diagnostics from this hospitalization (including imaging, microbiology, ancillary and laboratory) are listed below for reference.   Imaging Studies: DG CHEST PORT 1 VIEW Result Date: 09/01/2023 CLINICAL DATA:  Shortness of breath EXAM: PORTABLE CHEST 1 VIEW COMPARISON:  Chest radiograph dated 08/29/2023 FINDINGS: Normal lung volumes. Bilateral interstitial and reticular opacities in keeping with pulmonary fibrosis noted on prior CT. No pleural effusion or pneumothorax. Enlarged cardiomediastinal silhouette is likely projectional. No acute osseous abnormality. IMPRESSION: Bilateral interstitial and reticular opacities in keeping with pulmonary fibrosis noted on prior CT. Electronically Signed   By: Limin  Xu M.D.   On: 09/01/2023 11:53   ECHOCARDIOGRAM COMPLETE BUBBLE STUDY Result Date: 08/31/2023    ECHOCARDIOGRAM REPORT   Patient Name:   FLOYCE BUJAK Date of Exam: 08/31/2023 Medical Rec #:  [id number]     Height:       64.0 in Accession #:    [id number]    Weight:       143.7 lb Date of Birth:  04-21-1955      BSA:          1.700 m Patient Age:    67 years      BP:           97/68 mmHg Patient Gender: F             HR:           73 bpm. Exam Location:  Inpatient Procedure: 2D Echo, Cardiac Doppler, Color Doppler and Saline Contrast Bubble            Study (Both Spectral and Color Flow Doppler were utilized during            procedure). Indications:    Respiratory failure  History:        Patient has prior history of Echocardiogram examinations, most  recent 11/29/2022. CHF, COPD, Signs/Symptoms:Chest Pain; Risk                 Factors:Hypertension, Dyslipidemia and Current Smoker.  Sonographer:    Terrilee Few RCS Referring Phys: [id number] Francyne Islam DEWALD IMPRESSIONS  1. Left ventricular ejection fraction, by estimation, is 65 to 70%. The left ventricle has normal function. The left ventricle has no regional wall motion abnormalities. Left ventricular diastolic parameters are  consistent with Grade I diastolic dysfunction (impaired relaxation).  2. Right ventricular systolic function is normal. The right ventricular size is normal.  3. Left atrial size was moderately dilated.  4. Right atrial size was moderately dilated.  5. There is no evidence of cardiac tamponade.  6. The mitral valve is normal in structure. Mild mitral valve regurgitation. No evidence of mitral stenosis.  7. The aortic valve is tricuspid. There is mild calcification of the aortic valve. Aortic valve regurgitation is not visualized. Aortic valve sclerosis/calcification is present, without any evidence of aortic stenosis.  8. Aortic dilatation noted. There is borderline dilatation of the ascending aorta, measuring 39 mm.  9. The inferior vena cava is normal in size with greater than 50% respiratory variability, suggesting right atrial pressure of 3 mmHg. 10. Agitated saline contrast bubble study was negative, with no evidence of any interatrial shunt. FINDINGS  Left Ventricle: Left ventricular ejection fraction, by estimation, is 65 to 70%. The left ventricle has normal function. The left ventricle has no regional wall motion abnormalities. The left ventricular internal cavity size was normal in size. There is  no left ventricular hypertrophy. Left ventricular diastolic parameters are consistent with Grade I diastolic dysfunction (impaired relaxation). Right Ventricle: The right ventricular size is normal. No increase in right ventricular wall thickness. Right ventricular systolic function is normal. Left Atrium: Left atrial size was moderately dilated. Right Atrium: Right atrial size was moderately dilated. Pericardium: Trivial pericardial effusion is present. There is no evidence of cardiac tamponade. Mitral Valve: The mitral valve is normal in structure. Mild mitral valve regurgitation. No evidence of mitral valve stenosis. Tricuspid Valve: The tricuspid valve is normal in structure. Tricuspid valve regurgitation is  trivial. No evidence of tricuspid stenosis. Aortic Valve: The aortic valve is tricuspid. There is mild calcification of the aortic valve. Aortic valve regurgitation is not visualized. Aortic valve sclerosis/calcification is present, without any evidence of aortic stenosis. Aortic valve peak gradient measures 7.6 mmHg. Pulmonic Valve: The pulmonic valve was normal in structure. Pulmonic valve regurgitation is trivial. No evidence of pulmonic stenosis. Aorta: Aortic dilatation noted. There is borderline dilatation of the ascending aorta, measuring 39 mm. Venous: The inferior vena cava is normal in size with greater than 50% respiratory variability, suggesting right atrial pressure of 3 mmHg. IAS/Shunts: No atrial level shunt detected by color flow Doppler. Agitated saline contrast was given intravenously to evaluate for intracardiac shunting. Agitated saline contrast bubble study was negative, with no evidence of any interatrial shunt.  LEFT VENTRICLE PLAX 2D LVIDd:         5.40 cm   Diastology LVIDs:         3.20 cm   LV e' medial:    7.83 cm/s LV PW:         1.00 cm   LV E/e' medial:  10.5 LV IVS:        0.80 cm   LV e' lateral:   13.70 cm/s LVOT diam:     2.20 cm   LV E/e' lateral: 6.0 LV SV:  88 LV SV Index:   52 LVOT Area:     3.80 cm  RIGHT VENTRICLE             IVC RV S prime:     20.90 cm/s  IVC diam: 1.20 cm TAPSE (M-mode): 2.7 cm LEFT ATRIUM             Index        RIGHT ATRIUM           Index LA diam:        5.00 cm 2.94 cm/m   RA Area:     16.30 cm LA Vol (A2C):   82.1 ml 48.29 ml/m  RA Volume:   35.30 ml  20.76 ml/m LA Vol (A4C):   96.8 ml 56.94 ml/m LA Biplane Vol: 93.2 ml 54.82 ml/m  AORTIC VALVE AV Area (Vmax): 2.84 cm AV Vmax:        138.00 cm/s AV Peak Grad:   7.6 mmHg LVOT Vmax:      103.00 cm/s LVOT Vmean:     69.200 cm/s LVOT VTI:       0.232 m  AORTA Ao Root diam: 3.50 cm Ao Asc diam:  3.90 cm MITRAL VALVE MV Area (PHT): 4.15 cm    SHUNTS MV Decel Time: 183 msec    Systemic VTI:   0.23 m MV E velocity: 82.30 cm/s  Systemic Diam: 2.20 cm MV A velocity: 81.20 cm/s MV E/A ratio:  1.01 Jules Oar MD Electronically signed by Jules Oar MD Signature Date/Time: 08/31/2023/8:38:48 PM    Final    US  Abdomen Limited RUQ (LIVER/GB) Result Date: 08/31/2023 CLINICAL DATA:  Cirrhosis. EXAM: ULTRASOUND ABDOMEN LIMITED RIGHT UPPER QUADRANT COMPARISON:  11/28/2022 FINDINGS: Gallbladder: Physiologically distended. No gallstones. Mild wall thickening at 3.7 mm. No sonographic Murphy sign noted by sonographer. Common bile duct: Diameter: 5 mm. Liver: Coarsened echotexture with nodular contours consistent with cirrhosis. No evidence of focal lesion. Portal vein is patent on color Doppler imaging with normal direction of blood flow towards the liver. Other: Trace right upper quadrant ascites. IMPRESSION: 1. Cirrhotic hepatic morphology.  No focal lesion. 2. Mild gallbladder wall thickening is likely related to chronic liver disease. 3. Trace right upper quadrant ascites. Electronically Signed   By: Chadwick Colonel M.D.   On: 08/31/2023 18:36   CT Angio Chest PE W and/or Wo Contrast Result Date: 08/29/2023 CLINICAL DATA:  Cough, shortness of breath, and hypoxia. COPD Pulmonary embolism (PE) suspected, high prob EXAM: CT ANGIOGRAPHY CHEST WITH CONTRAST TECHNIQUE: Multidetector CT imaging of the chest was performed using the standard protocol during bolus administration of intravenous contrast. Multiplanar CT image reconstructions and MIPs were obtained to evaluate the vascular anatomy. RADIATION DOSE REDUCTION: This exam was performed according to the departmental dose-optimization program which includes automated exposure control, adjustment of the mA and/or kV according to patient size and/or use of iterative reconstruction technique. CONTRAST:  75mL OMNIPAQUE  IOHEXOL  350 MG/ML SOLN COMPARISON:  CT angio chest 11/28/2022 FINDINGS: Cardiovascular: Satisfactory opacification of the pulmonary arteries  to the segmental level. No evidence of pulmonary embolism. The main pulmonary artery is enlarged in caliber measuring up to 3.7 cm. Normal heart size. No significant pericardial effusion. The thoracic aorta is normal in caliber. Mild atherosclerotic plaque of the thoracic aorta. At least 3 vessel coronary artery calcifications. Mediastinum/Nodes: Stable bilateral hilar lymphadenopathy with as an example a 1.5 cm right hilar lymph node (5:74). Prominent mediastinal lymph nodes. No enlarged axillary lymph nodes. Thyroid  gland,  trachea, and esophagus demonstrate no significant findings. Small hiatal hernia. Lungs/Pleura: Mild emphysematous changes. Peripheral reticulations of the lungs. Mosaic attenuation. Diffuse bronchial wall thickening. Right base atelectasis. No focal consolidation. Stable scattered subpleural nodules within the right lung. No new pulmonary nodule. No pulmonary mass. No pleural effusion. No pneumothorax. Upper Abdomen: Nodular hepatic contour. Musculoskeletal: No chest wall abnormality. No suspicious lytic or blastic osseous lesions. No acute displaced fracture. Multilevel degenerative changes of the spine. Review of the MIP images confirms the above findings. IMPRESSION: 1. No pulmonary embolus. 2. Enlarged main pulmonary artery-correlate for pulmonary hypertension. 3. Pulmonary fibrosis and small airway disease. 4. Indeterminate bilateral lymphadenopathy and prominent mediastinal lymph nodes. 5. Stable pulmonary nodules with no new nodules. No further follow-up indicated. 6. Cirrhosis. 7. Small hiatal hernia. 8. Aortic Atherosclerosis (ICD10-I70.0) including at least 3 vessel coronary artery calcification. 9. Emphysema (ICD10-J43.9). Electronically Signed   By: Morgane  Naveau M.D.   On: 08/29/2023 19:01   DG Chest 2 View Result Date: 08/29/2023 CLINICAL DATA:  Cough, shortness of breath, and hypoxia.  COPD EXAM: CHEST - 2 VIEW COMPARISON:  None Available. FINDINGS: The heart size and  mediastinal contours are within normal limits. Mild chronic interstitial prominence again noted. No evidence of acute infiltrate or edema. No pleural effusion. The visualized skeletal structures are unremarkable. IMPRESSION: Mild chronic interstitial lung disease. No acute findings. Electronically Signed   By: Marlyce Sine M.D.   On: 08/29/2023 14:38   Microbiology: Results for orders placed or performed during the hospital encounter of 08/29/23  Resp panel by RT-PCR (RSV, Flu A&B, Covid) Anterior Nasal Swab     Status: None   Collection Time: 08/29/23  1:39 PM   Specimen: Anterior Nasal Swab  Result Value Ref Range Status   SARS Coronavirus 2 by RT PCR NEGATIVE NEGATIVE Final   Influenza A by PCR NEGATIVE NEGATIVE Final   Influenza B by PCR NEGATIVE NEGATIVE Final    Comment: (NOTE) The Xpert Xpress SARS-CoV-2/FLU/RSV plus assay is intended as an aid in the diagnosis of influenza from Nasopharyngeal swab specimens and should not be used as a sole basis for treatment. Nasal washings and aspirates are unacceptable for Xpert Xpress SARS-CoV-2/FLU/RSV testing.  Fact Sheet for Patients: BloggerCourse.com  Fact Sheet for Healthcare Providers: SeriousBroker.it  This test is not yet approved or cleared by the [place]  FDA and has been authorized for detection and/or diagnosis of SARS-CoV-2 by FDA under an Emergency Use Authorization (EUA). This EUA will remain in effect (meaning this test can be used) for the duration of the COVID-19 declaration under Section 564(b)(1) of the Act, 21 U.S.C. section 360bbb-3(b)(1), unless the authorization is terminated or revoked.     Resp Syncytial Virus by PCR NEGATIVE NEGATIVE Final    Comment: (NOTE) Fact Sheet for Patients: BloggerCourse.com  Fact Sheet for Healthcare Providers: SeriousBroker.it  This test is not yet approved or cleared by the  [place]  FDA and has been authorized for detection and/or diagnosis of SARS-CoV-2 by FDA under an Emergency Use Authorization (EUA). This EUA will remain in effect (meaning this test can be used) for the duration of the COVID-19 declaration under Section 564(b)(1) of the Act, 21 U.S.C. section 360bbb-3(b)(1), unless the authorization is terminated or revoked.  Performed at [facility], [address] [place], [place] [address]   Culture, blood (routine x 2)     Status: None (Preliminary result)   Collection Time: 08/29/23  1:39 PM   Specimen: BLOOD RIGHT FOREARM  Result Value Ref Range Status   Specimen Description BLOOD RIGHT FOREARM  Final   Special Requests   Final    BOTTLES DRAWN AEROBIC AND ANAEROBIC Blood Culture adequate volume   Culture   Final    NO GROWTH 3 DAYS Performed at [facility], [address] [place], [place] [address]    Report Status PENDING  Incomplete  Culture, blood (routine x 2)     Status: None (Preliminary result)   Collection Time: 08/29/23  1:44 PM   Specimen: BLOOD RIGHT ARM  Result Value Ref Range Status   Specimen Description BLOOD RIGHT ARM  Final   Special Requests   Final    BOTTLES DRAWN AEROBIC AND ANAEROBIC Blood Culture adequate volume   Culture   Final    NO GROWTH 3 DAYS Performed at [facility], [address] [place], [place] [address]    Report Status PENDING  Incomplete   Labs: CBC: Recent Labs  Lab 08/29/23 1339 08/30/23 0413 08/31/23 0533 09/01/23 0551  WBC 5.7 4.9 4.3 7.3  NEUTROABS 3.6  --  3.4 5.7  HGB 8.9* 9.6* 8.8* 9.4*  HCT 30.4* 33.3* 30.7* 32.5*  MCV 69.9* 69.2* 70.1* 69.6*  PLT 415* 394 363 441*   Basic Metabolic Panel: Recent Labs  Lab 08/29/23 1339 08/29/23 2120 08/30/23 0413 08/31/23 0533 09/01/23 0551  NA 129*  --  131* 130* 134*  K 3.9  --  4.6 4.4 5.0  CL 96*  --  97* 96* 101  CO2 21*  --  23 22 24   GLUCOSE 84  --  156* 136* 124*  BUN 15  --  13 22 25*   CREATININE 0.78 0.83 0.79 0.80 0.94  CALCIUM  8.3*  --  8.8* 8.8* 8.9  MG  --   --   --  1.9 2.1  PHOS  --   --   --  4.4 5.0*   Liver Function Tests: Recent Labs  Lab 08/29/23 1339 08/31/23 0533 09/01/23 0551  AST 44* 32 36  ALT 22 21 25   ALKPHOS 131* 112 111  BILITOT 0.4 0.5 0.6  PROT 8.2* 8.3* 8.4*  ALBUMIN 3.3* 3.3* 3.5   CBG: No results for input(s): "GLUCAP" in the last 168 hours.  Discharge time spent: greater than 30 minutes.  Signed: Aura Leeds, DO [facility] 09/01/2023

## 2023-09-02 LAB — HCV RNA QUANT RFLX ULTRA OR GENOTYP
HCV RNA Qnt(log copy/mL): 6.164 {Log_IU}/mL
HepC Qn: 1460000 [IU]/mL

## 2023-09-02 LAB — HEPATITIS C GENOTYPE

## 2023-09-02 NOTE — Telephone Encounter (Signed)
 Sched appt per Dr. Diania Fortes

## 2023-09-03 LAB — CULTURE, BLOOD (ROUTINE X 2)
Culture: NO GROWTH
Culture: NO GROWTH
Special Requests: ADEQUATE
Special Requests: ADEQUATE

## 2023-09-23 ENCOUNTER — Encounter: Payer: Self-pay | Admitting: Pulmonary Disease

## 2023-09-23 ENCOUNTER — Ambulatory Visit (INDEPENDENT_AMBULATORY_CARE_PROVIDER_SITE_OTHER): Admitting: Pulmonary Disease

## 2023-09-23 ENCOUNTER — Telehealth: Payer: Self-pay

## 2023-09-23 VITALS — BP 117/68 | HR 74 | Ht 64.0 in | Wt 135.0 lb

## 2023-09-23 DIAGNOSIS — J449 Chronic obstructive pulmonary disease, unspecified: Secondary | ICD-10-CM | POA: Diagnosis not present

## 2023-09-23 DIAGNOSIS — J849 Interstitial pulmonary disease, unspecified: Secondary | ICD-10-CM | POA: Diagnosis not present

## 2023-09-23 DIAGNOSIS — K7460 Unspecified cirrhosis of liver: Secondary | ICD-10-CM | POA: Diagnosis not present

## 2023-09-23 DIAGNOSIS — J9611 Chronic respiratory failure with hypoxia: Secondary | ICD-10-CM

## 2023-09-23 DIAGNOSIS — F17210 Nicotine dependence, cigarettes, uncomplicated: Secondary | ICD-10-CM

## 2023-09-23 NOTE — Patient Instructions (Addendum)
 Recommend Quitting smoking - continue to use nicotine  patches - use mini nicotine  lozenges 2mg  as needed for break through cravings - set a date in 2 weeks to work on tapering down cigarettes then quitting for good  You qualify for lung cancer screening, we will arrange follow up scan next year  We will check your oxygen  levels today when walking to qualify you for a portable oxygen  concentrator  Continue wearing oxygen  at night with sleep  Continue breztri  inhaler 2 puffs twice daily - rinse mouth out after each use  Use albuterol  inhaler as needed  Follow up in 3 months

## 2023-09-23 NOTE — Telephone Encounter (Signed)
 POC order - faxed paperwork after new insurance starts on 09/29/2023

## 2023-09-23 NOTE — Progress Notes (Unsigned)
 Synopsis: Referred in June 2025 for Hospital follow up, Acute Respiratory Failure  Subjective:   PATIENT ID: Kathryn Banks GENDER: female DOB: [birth year], MRN: [id number]   HPI  Chief Complaint  Patient presents with   Follow-up    Kathryn Banks is a 68 year old female with COPD and pulmonary fibrosis who presents for follow-up after recent hospital admission for COPD exacerbation.  She uses oxygen  therapy primarily at night at a rate of three liters per minute. Her breathing has improved since her hospital stay, and she can detect when her oxygen  levels drop, necessitating supplemental oxygen . After exertion, such as grocery shopping for two to three hours, she requires oxygen  upon returning home.  She smokes about half a pack per day, reduced from two packs per day. She previously quit for two and a half years using Nicorette  gum but now uses nicotine  patches and lozenges due to dentures. She is on a 21 mg nicotine  patch and is working on further reducing her smoking.  She uses Breztri  inhaler, two puffs in the morning and evening, and has not needed albuterol  recently. Pulmonary function tests indicate COPD with mild obstruction and significantly impaired diffusion capacity.   PFTs at [facility] on 06/30/23 show ratio of 66, FEV1 97%, TLC 113%, RV 130%, DLCO 33%.  Past Medical History:  Diagnosis Date   Acid reflux    Arthritis    FINGERS   COPD (chronic obstructive pulmonary disease) (HCC)    Depression    ETOH abuse    Hepatitis    C-PT STATED SHE WAS TOLD THIS 2014   Hypertension    OFF MEDS FOR FEW YEARS     Family History  Problem Relation Age of Onset   Hypertension Mother    Ulcers Mother        Recent bleeding ulcers requiring transfusion, 2020   Cancer Father    Hypertension Father    CAD Father 17   CVA Sister 75   Breast cancer Neg Hx      Social History   Socioeconomic History   Marital status: Married    Spouse name: Not on file   Number of children:  Not on file   Years of education: Not on file   Highest education level: Not on file  Occupational History   Occupation: [employer]  Tobacco Use   Smoking status: Every Day    Current packs/day: 0.50    Average packs/day: 1 pack/day for 45.8 years (45.4 ttl pk-yrs)    Types: Cigarettes    Start date: 11/28/2022   Smokeless tobacco: Never  Vaping Use   Vaping status: Never Used  Substance and Sexual Activity   Alcohol use: Not Currently    Comment: OCC -- beer and vodka; 1-2 every few days; Off of alcohol for 2 months   Drug use: Not Currently    Types: Opium, Benzodiazepines, Heroin    Comment: remote history, quit about 1985   Sexual activity: Yes  Other Topics Concern   Not on file  Social History Narrative   Not on file   Social Drivers of Health   Financial Resource Strain: Medium Risk (01/23/2023)   Received from [facility]   Overall Financial Resource Strain (CARDIA)    Difficulty of Paying Living Expenses: Somewhat hard  Food Insecurity: No Food Insecurity (08/30/2023)   Hunger Vital Sign    Worried About Running Out of Food in the Last Year: Never true  Ran Out of Food in the Last Year: Never true  Transportation Needs: No Transportation Needs (08/30/2023)   PRAPARE - Administrator, Civil Service (Medical): No    Lack of Transportation (Non-Medical): No  Physical Activity: Not on file  Stress: Not on file  Social Connections: Moderately Integrated (08/30/2023)   Social Connection and Isolation Panel    Frequency of Communication with Friends and Family: Never    Frequency of Social Gatherings with Friends and Family: More than three times a week    Attends Religious Services: More than 4 times per year    Active Member of [employer] or Organizations: No    Attends Banker Meetings: Patient declined    Marital Status: Living with partner  Intimate Partner Violence: Not At Risk (08/30/2023)   Humiliation, Afraid, Rape, and Kick  questionnaire    Fear of Current or Ex-Partner: No    Emotionally Abused: No    Physically Abused: No    Sexually Abused: No     No Known Allergies   Outpatient Medications Prior to Visit  Medication Sig Dispense Refill   albuterol  (PROVENTIL ) (2.5 MG/3ML) 0.083% nebulizer solution Take 3 mLs (2.5 mg total) by nebulization every 2 (two) hours as needed for wheezing. 75 mL 12   ALPRAZolam  (XANAX ) 1 MG tablet Take 1 mg by mouth as needed for anxiety.     amLODipine  (NORVASC ) 5 MG tablet Take 1 tablet (5 mg total) by mouth daily. (Patient taking differently: Take 5 mg by mouth in the morning.) 30 tablet 0   baclofen  (LIORESAL ) 20 MG tablet Take 20 mg by mouth 3 (three) times daily as needed for muscle spasms.     benzonatate  (TESSALON ) 100 MG capsule Take 1 capsule (100 mg total) by mouth 3 (three) times daily as needed for cough. 20 capsule 0   budesonide -glycopyrrolate -formoterol  (BREZTRI  AEROSPHERE) 160-9-4.8 MCG/ACT AERO inhaler Inhale 2 puffs into the lungs in the morning and at bedtime. 1 each 0   cyanocobalamin  (VITAMIN B12) 1000 MCG tablet Take 1,000 mcg by mouth in the morning.     diphenhydramine -acetaminophen  (TYLENOL  PM) 25-500 MG TABS tablet Take 1 tablet by mouth at bedtime.     furosemide  (LASIX ) 40 MG tablet Take 1 tablet (40 mg total) by mouth daily. (Patient taking differently: Take 40 mg by mouth in the morning.) 30 tablet 0   MAGNESIUM  OXIDE 400 PO Take 1 tablet by mouth in the morning and at bedtime.     metoprolol  tartrate (LOPRESSOR ) 25 MG tablet Take 0.5 tablets (12.5 mg total) by mouth 2 (two) times daily. 30 tablet 0   nicotine  (NICODERM CQ  - DOSED IN MG/24 HOURS) 21 mg/24hr patch Place 21 mg onto the skin as needed.     omeprazole  (PRILOSEC  OTC) 20 MG tablet Take 20 mg by mouth in the morning.     ondansetron  (ZOFRAN ) 4 MG tablet Take 1 tablet (4 mg total) by mouth every 6 (six) hours as needed for nausea. 20 tablet 0   polyethylene glycol (MIRALAX  / GLYCOLAX ) 17 g  packet Take 17 g by mouth daily. 14 each 0   senna-docusate (SENOKOT-S) 8.6-50 MG tablet Take 1 tablet by mouth at bedtime. 30 tablet 0   sertraline  (ZOLOFT ) 25 MG tablet Take 1 tablet (25 mg total) by mouth at bedtime. 30 tablet 0   spironolactone  (ALDACTONE ) 25 MG tablet Take 1 tablet (25 mg total) by mouth daily. 30 tablet 0   No facility-administered medications prior to  visit.    Review of Systems  Constitutional:  Negative for chills, fever, malaise/fatigue and weight loss.  HENT:  Negative for congestion, sinus pain and sore throat.   Eyes: Negative.   Respiratory:  Positive for shortness of breath. Negative for cough, hemoptysis, sputum production and wheezing.   Cardiovascular:  Negative for chest pain, palpitations, orthopnea, claudication and leg swelling.  Gastrointestinal:  Negative for abdominal pain, heartburn, nausea and vomiting.  Genitourinary: Negative.   Musculoskeletal:  Negative for joint pain and myalgias.  Skin:  Negative for rash.  Neurological:  Negative for weakness.  Endo/Heme/Allergies: Negative.   Psychiatric/Behavioral: Negative.     Objective:   Vitals:   09/23/23 1039  BP: 117/68  Pulse: 74  SpO2: 91%  Weight: 135 lb (61.2 kg)  Height: 5' 4 (1.626 m)   Physical Exam Constitutional:      General: She is not in acute distress.    Appearance: Normal appearance.  Eyes:     General: No scleral icterus.    Conjunctiva/sclera: Conjunctivae normal.  Cardiovascular:     Rate and Rhythm: Normal rate and regular rhythm.  Pulmonary:     Breath sounds: No wheezing, rhonchi or rales.  Musculoskeletal:     Right lower leg: No edema.     Left lower leg: No edema.  Skin:    General: Skin is warm and dry.  Neurological:     General: No focal deficit present.    Resting   Supplemental oxygen  during test? --  Resting Heart Rate 67  Resting Sp02 93  Lap 1 (250 feet)   HR 79  02 Sat 92  Lap 2 (250 feet)   HR 95  02 Sat 88  Lap 3 (250  feet)   HR --  02 Sat --  Tech Comments: patienty O2 dropped to 85% placed on 3L O2 POC back up to 95%    CBC    Component Value Date/Time   WBC 7.3 09/01/2023 0551   RBC 4.67 09/01/2023 0551   HGB 9.4 (L) 09/01/2023 0551   HGB 17.8 (H) 04/22/2013 0408   HCT 32.5 (L) 09/01/2023 0551   HCT 53.5 (H) 04/22/2013 0408   PLT 441 (H) 09/01/2023 0551   PLT 391 04/22/2013 0408   MCV 69.6 (L) 09/01/2023 0551   MCV 98 04/22/2013 0408   MCH 20.1 (L) 09/01/2023 0551   MCHC 28.9 (L) 09/01/2023 0551   RDW 20.4 (H) 09/01/2023 0551   RDW 15.8 (H) 04/22/2013 0408   LYMPHSABS 1.2 09/01/2023 0551   LYMPHSABS 3.1 04/22/2013 0408   MONOABS 0.3 09/01/2023 0551   MONOABS 0.6 04/22/2013 0408   EOSABS 0.0 09/01/2023 0551   EOSABS 0.1 04/22/2013 0408   BASOSABS 0.0 09/01/2023 0551   BASOSABS 0.1 04/22/2013 0408      Latest Ref Rng & Units 09/01/2023    5:51 AM 08/31/2023    5:33 AM 08/30/2023    4:13 AM  BMP  Glucose 70 - 99 mg/dL 875  863  843   BUN 8 - 23 mg/dL 25  22  13    Creatinine 0.44 - 1.00 mg/dL 9.05  9.19  9.20   Sodium 135 - 145 mmol/L 134  130  131   Potassium 3.5 - 5.1 mmol/L 5.0  4.4  4.6   Chloride 98 - 111 mmol/L 101  96  97   CO2 22 - 32 mmol/L 24  22  23    Calcium  8.9 - 10.3 mg/dL 8.9  8.8  8.8    Chest imaging: CXR 09/01/23 Bilateral interstitial and reticular opacities in keeping with pulmonary fibrosis noted on prior CT.  CTA Chest 08/29/23 1. No pulmonary embolus. 2. Enlarged main pulmonary artery-correlate for pulmonary hypertension. 3. Pulmonary fibrosis and small airway disease. 4. Indeterminate bilateral lymphadenopathy and prominent mediastinal lymph nodes. 5. Stable pulmonary nodules with no new nodules. No further follow-up indicated. 6. Cirrhosis. 7. Small hiatal hernia. 8. Aortic Atherosclerosis (ICD10-I70.0) including at least 3 vessel coronary artery calcification. 9. Emphysema (ICD10-J43.9).  PFT:     No data to display           Labs:  Path:  Echo:  Heart Catheterization:       Assessment & Plan:   Chronic respiratory failure with hypoxia (HCC)  Chronic obstructive pulmonary disease, unspecified COPD type (HCC) - Plan: CANCELED: Pulmonary Function Test  ILD (interstitial lung disease) (HCC)  Hepatic cirrhosis, unspecified hepatic cirrhosis type, unspecified whether ascites present Kathryn Banks) - Plan: Ambulatory referral to Gastroenterology  Discussion: Kathryn Banks is a 68 year old female with COPD and pulmonary fibrosis who presents for follow-up after recent hospital admission for COPD exacerbation.  Chronic Obstructive Pulmonary Disease (COPD) - Continue Breztri  inhaler, two puffs morning and evening. - Encourage smoking cessation with nicotine  patches and lozenges. - Discuss potential medication for lung scarring in future visits.  Pulmonary Fibrosis Lung scarring likely due to smoking, contributing to impaired diffusion capacity and oxygen  requirement. - Monitor lung function. - Consider medication to slow fibrosis in future visits.  Chronic Hypoxemic Respiratory Failure Requires 3 liters of oxygen  at night and with exertion. Lacks portable concentrator, using small tanks for mobility. - Order overnight oxygen  study to assess need for supplemental oxygen . - Simple walked qualified for 3L pulsed via POC, will send order to DME.  Nicotine  Dependence Smoking half a pack per day, reduced from previous. Using nicotine  patches and lozenges. Partner's smoking may influence cessation. Discussed smoking cessation for 3 minutes - Continue 21 mg nicotine  patches and lozenges. - Set quit date two weeks out, gradually reduce cigarette use. - Encourage partner to quit smoking.  Follow up in 3 months  Kathryn Chill, Kathryn Banks  Pulmonary & Critical Care Office: [phone]   Current Outpatient Medications:    albuterol  (PROVENTIL ) (2.5 MG/3ML) 0.083% nebulizer solution, Take 3 mLs (2.5 mg total)  by nebulization every 2 (two) hours as needed for wheezing., Disp: 75 mL, Rfl: 12   ALPRAZolam  (XANAX ) 1 MG tablet, Take 1 mg by mouth as needed for anxiety., Disp: , Rfl:    amLODipine  (NORVASC ) 5 MG tablet, Take 1 tablet (5 mg total) by mouth daily. (Patient taking differently: Take 5 mg by mouth in the morning.), Disp: 30 tablet, Rfl: 0   baclofen  (LIORESAL ) 20 MG tablet, Take 20 mg by mouth 3 (three) times daily as needed for muscle spasms., Disp: , Rfl:    benzonatate  (TESSALON ) 100 MG capsule, Take 1 capsule (100 mg total) by mouth 3 (three) times daily as needed for cough., Disp: 20 capsule, Rfl: 0   budesonide -glycopyrrolate -formoterol  (BREZTRI  AEROSPHERE) 160-9-4.8 MCG/ACT AERO inhaler, Inhale 2 puffs into the lungs in the morning and at bedtime., Disp: 1 each, Rfl: 0   cyanocobalamin  (VITAMIN B12) 1000 MCG tablet, Take 1,000 mcg by mouth in the morning., Disp: , Rfl:    diphenhydramine -acetaminophen  (TYLENOL  PM) 25-500 MG TABS tablet, Take 1 tablet by mouth at bedtime., Disp: , Rfl:    furosemide  (LASIX ) 40 MG tablet, Take 1 tablet (40 mg  total) by mouth daily. (Patient taking differently: Take 40 mg by mouth in the morning.), Disp: 30 tablet, Rfl: 0   MAGNESIUM  OXIDE 400 PO, Take 1 tablet by mouth in the morning and at bedtime., Disp: , Rfl:    metoprolol  tartrate (LOPRESSOR ) 25 MG tablet, Take 0.5 tablets (12.5 mg total) by mouth 2 (two) times daily., Disp: 30 tablet, Rfl: 0   nicotine  (NICODERM CQ  - DOSED IN MG/24 HOURS) 21 mg/24hr patch, Place 21 mg onto the skin as needed., Disp: , Rfl:    omeprazole  (PRILOSEC  OTC) 20 MG tablet, Take 20 mg by mouth in the morning., Disp: , Rfl:    ondansetron  (ZOFRAN ) 4 MG tablet, Take 1 tablet (4 mg total) by mouth every 6 (six) hours as needed for nausea., Disp: 20 tablet, Rfl: 0   polyethylene glycol (MIRALAX  / GLYCOLAX ) 17 g packet, Take 17 g by mouth daily., Disp: 14 each, Rfl: 0   senna-docusate (SENOKOT-S) 8.6-50 MG tablet, Take 1 tablet by mouth  at bedtime., Disp: 30 tablet, Rfl: 0   sertraline  (ZOLOFT ) 25 MG tablet, Take 1 tablet (25 mg total) by mouth at bedtime., Disp: 30 tablet, Rfl: 0   spironolactone  (ALDACTONE ) 25 MG tablet, Take 1 tablet (25 mg total) by mouth daily., Disp: 30 tablet, Rfl: 0

## 2023-10-26 ENCOUNTER — Encounter: Payer: Self-pay | Admitting: Physician Assistant

## 2023-12-21 ENCOUNTER — Other Ambulatory Visit: Payer: Self-pay

## 2023-12-21 ENCOUNTER — Inpatient Hospital Stay (HOSPITAL_COMMUNITY)
Admission: EM | Admit: 2023-12-21 | Discharge: 2023-12-25 | DRG: 291 | Disposition: A | Discharge status: Home / Self Care | Attending: Internal Medicine | Admitting: Internal Medicine

## 2023-12-21 ENCOUNTER — Emergency Department (HOSPITAL_COMMUNITY)

## 2023-12-21 DIAGNOSIS — Z8249 Family history of ischemic heart disease and other diseases of the circulatory system: Secondary | ICD-10-CM | POA: Diagnosis not present

## 2023-12-21 DIAGNOSIS — B1920 Unspecified viral hepatitis C without hepatic coma: Secondary | ICD-10-CM | POA: Diagnosis present

## 2023-12-21 DIAGNOSIS — J9621 Acute and chronic respiratory failure with hypoxia: Secondary | ICD-10-CM | POA: Diagnosis present

## 2023-12-21 DIAGNOSIS — K7460 Unspecified cirrhosis of liver: Secondary | ICD-10-CM | POA: Diagnosis present

## 2023-12-21 DIAGNOSIS — Z716 Tobacco abuse counseling: Secondary | ICD-10-CM

## 2023-12-21 DIAGNOSIS — D509 Iron deficiency anemia, unspecified: Secondary | ICD-10-CM | POA: Diagnosis present

## 2023-12-21 DIAGNOSIS — J8410 Pulmonary fibrosis, unspecified: Secondary | ICD-10-CM | POA: Diagnosis present

## 2023-12-21 DIAGNOSIS — Z9981 Dependence on supplemental oxygen: Secondary | ICD-10-CM

## 2023-12-21 DIAGNOSIS — F419 Anxiety disorder, unspecified: Secondary | ICD-10-CM | POA: Diagnosis present

## 2023-12-21 DIAGNOSIS — N179 Acute kidney failure, unspecified: Secondary | ICD-10-CM | POA: Diagnosis present

## 2023-12-21 DIAGNOSIS — E871 Hypo-osmolality and hyponatremia: Secondary | ICD-10-CM | POA: Diagnosis present

## 2023-12-21 DIAGNOSIS — F17210 Nicotine dependence, cigarettes, uncomplicated: Secondary | ICD-10-CM | POA: Diagnosis present

## 2023-12-21 DIAGNOSIS — F32A Depression, unspecified: Secondary | ICD-10-CM | POA: Diagnosis present

## 2023-12-21 DIAGNOSIS — Z1152 Encounter for screening for COVID-19: Secondary | ICD-10-CM

## 2023-12-21 DIAGNOSIS — Z79899 Other long term (current) drug therapy: Secondary | ICD-10-CM

## 2023-12-21 DIAGNOSIS — I5033 Acute on chronic diastolic (congestive) heart failure: Secondary | ICD-10-CM | POA: Diagnosis present

## 2023-12-21 DIAGNOSIS — J441 Chronic obstructive pulmonary disease with (acute) exacerbation: Principal | ICD-10-CM | POA: Diagnosis present

## 2023-12-21 DIAGNOSIS — K219 Gastro-esophageal reflux disease without esophagitis: Secondary | ICD-10-CM | POA: Diagnosis present

## 2023-12-21 DIAGNOSIS — Z7951 Long term (current) use of inhaled steroids: Secondary | ICD-10-CM

## 2023-12-21 DIAGNOSIS — Z809 Family history of malignant neoplasm, unspecified: Secondary | ICD-10-CM

## 2023-12-21 DIAGNOSIS — Z823 Family history of stroke: Secondary | ICD-10-CM

## 2023-12-21 DIAGNOSIS — I509 Heart failure, unspecified: Secondary | ICD-10-CM

## 2023-12-21 DIAGNOSIS — I110 Hypertensive heart disease with heart failure: Principal | ICD-10-CM | POA: Diagnosis present

## 2023-12-21 LAB — COMPREHENSIVE METABOLIC PANEL WITH GFR
ALT: 26 U/L (ref 0–44)
AST: 48 U/L — ABNORMAL HIGH (ref 15–41)
Albumin: 3.5 g/dL (ref 3.5–5.0)
Alkaline Phosphatase: 140 U/L — ABNORMAL HIGH (ref 38–126)
Anion gap: 12 (ref 5–15)
BUN: 12 mg/dL (ref 8–23)
CO2: 22 mmol/L (ref 22–32)
Calcium: 8.5 mg/dL — ABNORMAL LOW (ref 8.9–10.3)
Chloride: 93 mmol/L — ABNORMAL LOW (ref 98–111)
Creatinine, Ser: 1.13 mg/dL — ABNORMAL HIGH (ref 0.44–1.00)
GFR, Estimated: 54 mL/min — ABNORMAL LOW (ref >60)
Glucose, Bld: 109 mg/dL — ABNORMAL HIGH (ref 70–99)
Potassium: 4.5 mmol/L (ref 3.5–5.1)
Sodium: 127 mmol/L — ABNORMAL LOW (ref 135–145)
Total Bilirubin: 1.2 mg/dL (ref 0.0–1.2)
Total Protein: 8.6 g/dL — ABNORMAL HIGH (ref 6.5–8.1)

## 2023-12-21 LAB — I-STAT VENOUS BLOOD GAS, ED
Acid-base deficit: 1 mmol/L (ref 0.0–2.0)
Bicarbonate: 24.1 mmol/L (ref 20.0–28.0)
Calcium, Ion: 1.06 mmol/L — ABNORMAL LOW (ref 1.15–1.40)
HCT: 32 % — ABNORMAL LOW (ref 36.0–46.0)
Hemoglobin: 10.9 g/dL — ABNORMAL LOW (ref 12.0–15.0)
O2 Saturation: 56 %
Potassium: 4.7 mmol/L (ref 3.5–5.1)
Sodium: 129 mmol/L — ABNORMAL LOW (ref 135–145)
TCO2: 25 mmol/L (ref 22–32)
pCO2, Ven: 39.6 mmHg — ABNORMAL LOW (ref 44–60)
pH, Ven: 7.393 (ref 7.25–7.43)
pO2, Ven: 29 mmHg — CL (ref 32–45)

## 2023-12-21 LAB — RESP PANEL BY RT-PCR (RSV, FLU A&B, COVID)  RVPGX2
Influenza A by PCR: NEGATIVE
Influenza B by PCR: NEGATIVE
Resp Syncytial Virus by PCR: NEGATIVE
SARS Coronavirus 2 by RT PCR: NEGATIVE

## 2023-12-21 LAB — CBC
HCT: 30.4 % — ABNORMAL LOW (ref 36.0–46.0)
Hemoglobin: 8.4 g/dL — ABNORMAL LOW (ref 12.0–15.0)
MCH: 19.6 pg — ABNORMAL LOW (ref 26.0–34.0)
MCHC: 27.6 g/dL — ABNORMAL LOW (ref 30.0–36.0)
MCV: 70.9 fL — ABNORMAL LOW (ref 80.0–100.0)
Platelets: 343 K/uL (ref 150–400)
RBC: 4.29 MIL/uL (ref 3.87–5.11)
RDW: 19.1 % — ABNORMAL HIGH (ref 11.5–15.5)
WBC: 11.4 K/uL — ABNORMAL HIGH (ref 4.0–10.5)
nRBC: 0 % (ref 0.0–0.2)

## 2023-12-21 LAB — LIPASE, BLOOD: Lipase: 24 U/L (ref 11–51)

## 2023-12-21 LAB — TROPONIN I (HIGH SENSITIVITY)
Troponin I (High Sensitivity): 24 ng/L — ABNORMAL HIGH (ref <18)
Troponin I (High Sensitivity): 19 ng/L — ABNORMAL HIGH (ref <18)

## 2023-12-21 LAB — BRAIN NATRIURETIC PEPTIDE: B Natriuretic Peptide: 1251.5 pg/mL — ABNORMAL HIGH (ref 0.0–100.0)

## 2023-12-21 LAB — MAGNESIUM: Magnesium: 1.6 mg/dL — ABNORMAL LOW (ref 1.7–2.4)

## 2023-12-21 MED ORDER — PROCHLORPERAZINE EDISYLATE 10 MG/2ML IJ SOLN: 5.0000 mg | Freq: Four times a day (QID) | INTRAMUSCULAR | Status: DC | PRN

## 2023-12-21 MED ORDER — IPRATROPIUM-ALBUTEROL 0.5-2.5 (3) MG/3ML IN SOLN
3.0000 mL | RESPIRATORY_TRACT | Status: DC
  Administered 2023-12-21 – 2023-12-23 (×12): 3 mL via RESPIRATORY_TRACT
  Filled 2023-12-21 (×12): qty 3

## 2023-12-21 MED ORDER — SODIUM CHLORIDE 0.9 % IV SOLN
500.0000 mg | INTRAVENOUS | Status: DC
  Administered 2023-12-21 – 2023-12-23 (×3): 500 mg via INTRAVENOUS
  Filled 2023-12-21 (×3): qty 5

## 2023-12-21 MED ORDER — ACETAMINOPHEN 325 MG PO TABS
650.0000 mg | ORAL_TABLET | Freq: Four times a day (QID) | ORAL | Status: DC | PRN
  Administered 2023-12-24: 650 mg via ORAL
  Filled 2023-12-21: qty 2

## 2023-12-21 MED ORDER — FUROSEMIDE 10 MG/ML IJ SOLN
20.0000 mg | Freq: Four times a day (QID) | INTRAMUSCULAR | Status: DC
  Administered 2023-12-21 – 2023-12-22 (×3): 20 mg via INTRAVENOUS
  Filled 2023-12-21 (×3): qty 2

## 2023-12-21 MED ORDER — ENOXAPARIN SODIUM 40 MG/0.4ML IJ SOSY
40.0000 mg | PREFILLED_SYRINGE | INTRAMUSCULAR | Status: DC
  Administered 2023-12-21 – 2023-12-24 (×4): 40 mg via SUBCUTANEOUS
  Filled 2023-12-21 (×4): qty 0.4

## 2023-12-21 MED ORDER — NICOTINE 14 MG/24HR TD PT24
14.0000 mg | MEDICATED_PATCH | Freq: Every day | TRANSDERMAL | Status: DC
  Administered 2023-12-22 – 2023-12-25 (×4): 14 mg via TRANSDERMAL
  Filled 2023-12-21 (×4): qty 1

## 2023-12-21 MED ORDER — BUDESON-GLYCOPYRROL-FORMOTEROL 160-9-4.8 MCG/ACT IN AERO
2.0000 | INHALATION_SPRAY | Freq: Two times a day (BID) | RESPIRATORY_TRACT | Status: DC
  Administered 2023-12-22 – 2023-12-25 (×4): 2 via RESPIRATORY_TRACT
  Filled 2023-12-21 (×2): qty 5.9

## 2023-12-21 MED ORDER — ONDANSETRON HCL 4 MG/2ML IJ SOLN
4.0000 mg | Freq: Once | INTRAMUSCULAR | Status: AC
  Administered 2023-12-21: 4 mg via INTRAVENOUS
  Filled 2023-12-21: qty 2

## 2023-12-21 MED ORDER — IPRATROPIUM BROMIDE 0.02 % IN SOLN
0.5000 mg | Freq: Once | RESPIRATORY_TRACT | Status: AC
  Administered 2023-12-21: 0.5 mg via RESPIRATORY_TRACT
  Filled 2023-12-21: qty 2.5

## 2023-12-21 MED ORDER — MAGNESIUM OXIDE -MG SUPPLEMENT 400 (240 MG) MG PO TABS
400.0000 mg | ORAL_TABLET | Freq: Once | ORAL | Status: AC
  Administered 2023-12-21: 400 mg via ORAL
  Filled 2023-12-21: qty 1

## 2023-12-21 MED ORDER — MELATONIN 5 MG PO TABS
5.0000 mg | ORAL_TABLET | Freq: Every evening | ORAL | Status: DC | PRN
  Administered 2023-12-22 – 2023-12-24 (×2): 5 mg via ORAL
  Filled 2023-12-21 (×2): qty 1

## 2023-12-21 MED ORDER — POLYETHYLENE GLYCOL 3350 17 G PO PACK
17.0000 g | PACK | Freq: Every day | ORAL | Status: DC | PRN
  Administered 2023-12-24: 17 g via ORAL
  Filled 2023-12-21: qty 1

## 2023-12-21 MED ORDER — METHYLPREDNISOLONE SODIUM SUCC 125 MG IJ SOLR
125.0000 mg | Freq: Once | INTRAMUSCULAR | Status: AC
  Administered 2023-12-21: 125 mg via INTRAVENOUS
  Filled 2023-12-21: qty 2

## 2023-12-21 MED ORDER — ALBUTEROL SULFATE (2.5 MG/3ML) 0.083% IN NEBU
10.0000 mg/h | INHALATION_SOLUTION | Freq: Once | RESPIRATORY_TRACT | Status: AC
  Administered 2023-12-21: 10 mg/h via RESPIRATORY_TRACT
  Filled 2023-12-21: qty 3

## 2023-12-21 MED ORDER — MIDODRINE HCL 5 MG PO TABS
10.0000 mg | ORAL_TABLET | ORAL | Status: AC
  Administered 2023-12-21: 10 mg via ORAL
  Filled 2023-12-21: qty 2

## 2023-12-21 MED ORDER — FUROSEMIDE 10 MG/ML IJ SOLN
40.0000 mg | Freq: Once | INTRAMUSCULAR | Status: AC
Start: 2023-12-21 — End: 2023-12-21
  Administered 2023-12-21: 40 mg via INTRAVENOUS
  Filled 2023-12-21: qty 4

## 2023-12-21 NOTE — ED Notes (Signed)
 RT called by RN

## 2023-12-21 NOTE — ED Notes (Signed)
 RT at bedside.

## 2023-12-21 NOTE — ED Notes (Signed)
 MD at bedside.

## 2023-12-21 NOTE — ED Triage Notes (Signed)
 Patient arrives via [facility] for shob. Hx of COPD. On EKG with EMS patient In bigeminy and trigeminy, no hx of same per EMS. 88% on 6L/min [place], patient was in 70s on 4L. Patient chronically on 4L/min as needed. Patient endorses becoming diaphoretic and nauseous at grocery store today, O2 in 50s. No obvious ST elevation. One episode of emesis during triage by RN. 18 LAC.   EMS vitals 130/70 HR 80  O2 88 on 6L [place]

## 2023-12-21 NOTE — ED Notes (Signed)
 XR at bedside

## 2023-12-21 NOTE — H&P (Signed)
 History and Physical  Kathryn Banks FMW:[id number] DOB: February 20, 1956 DOA: 12/21/2023  Referring physician: Dr. Randol, EDP  PCP: Zachary Idelia LABOR, MD  Outpatient Specialists: Pulmonary, vascular surgery, oncology. Patient coming from: Home.  Chief Complaint: Shortness of breath.  HPI: Kathryn Banks is a 68 y.o. female with medical history significant for advanced COPD on 4 L oxygen  by nasal cannula continuously, current tobacco user 1 pack/day, hypertension, history of hepatitis C, cirrhosis, hypertension, anxiety disorder, GERD, presents to the ER with complaints of shortness of breath, worse with ambulation.  Symptoms started today.  Associated with 1 episode of emesis.  No reported subjective fevers or chills.  Endorses a nonproductive cough.  In the ER, requiring high flow nasal cannula 8 L to maintain O2 saturation above 90%.  Right JVD on exam.  Elevated BNP greater than 1200.  Cardiomegaly and mild pulm edema on chest x-ray.  The patient received IV Lasix  in the ER as well as bronchodilator nebulizers.  Due to concern for COPD and CHF exacerbation, [facility], hospitalist service, was asked to admit.  ED Course: Temperature 98.2.  BP 106/58, pulse 70, respiration rate 18, O2 saturation 94% on 6 L high flow nasal cannula.  Review of Systems: Review of systems as noted in the HPI. All other systems reviewed and are negative.   Past Medical History:  Diagnosis Date   Acid reflux    Arthritis    FINGERS   COPD (chronic obstructive pulmonary disease) (HCC)    Depression    ETOH abuse    Hepatitis    C-PT STATED SHE WAS TOLD THIS 2014   Hypertension    OFF MEDS FOR FEW YEARS   Past Surgical History:  Procedure Laterality Date   CESAREAN SECTION     OPEN REDUCTION INTERNAL FIXATION (ORIF) DISTAL RADIAL FRACTURE Right 10/15/2017   Procedure: OPEN REDUCTION INTERNAL FIXATION (ORIF) DISTAL RADIAL FRACTURE;  Surgeon: Kathlynn Sharper, MD;  Location: [facility] ORS;  Service: Orthopedics;  Laterality:  Right;    Social History:  reports that she has been smoking cigarettes. She started smoking about 12 months ago. She has a 45.5 pack-year smoking history. She has never used smokeless tobacco. She reports that she does not currently use alcohol. She reports that she does not currently use drugs after having used the following drugs: Opium, Benzodiazepines, and Heroin.   No Known Allergies  Family History  Problem Relation Age of Onset   Hypertension Mother    Ulcers Mother        Recent bleeding ulcers requiring transfusion, 2020   Cancer Father    Hypertension Father    CAD Father 42   CVA Sister 4   Breast cancer Neg Hx       Prior to Admission medications   Medication Sig Start Date End Date Taking? Authorizing Provider  albuterol  (PROVENTIL ) (2.5 MG/3ML) 0.083% nebulizer solution Take 3 mLs (2.5 mg total) by nebulization every 2 (two) hours as needed for wheezing. 09/01/23   Sherrill Cable Latif, DO  ALPRAZolam  (XANAX ) 1 MG tablet Take 1 mg by mouth as needed for anxiety.    [provider]  amLODipine  (NORVASC ) 5 MG tablet Take 1 tablet (5 mg total) by mouth daily. Patient taking differently: Take 5 mg by mouth in the morning. 02/01/19 08/29/23  Vernon Ranks, MD  baclofen  (LIORESAL ) 20 MG tablet Take 20 mg by mouth 3 (three) times daily as needed for muscle spasms. 01/06/23   [provider]  benzonatate  (TESSALON ) 100  MG capsule Take 1 capsule (100 mg total) by mouth 3 (three) times daily as needed for cough. 09/01/23   Sheikh, Omair Latif, DO  budesonide -glycopyrrolate -formoterol  (BREZTRI  AEROSPHERE) 160-9-4.8 MCG/ACT AERO inhaler Inhale 2 puffs into the lungs in the morning and at bedtime. 09/01/23   Sherrill Cable Latif, DO  cyanocobalamin  (VITAMIN B12) 1000 MCG tablet Take 1,000 mcg by mouth in the morning.    [provider]  diphenhydramine -acetaminophen  (TYLENOL  PM) 25-500 MG TABS tablet Take 1 tablet by mouth at bedtime.    [provider]   furosemide  (LASIX ) 40 MG tablet Take 1 tablet (40 mg total) by mouth daily. Patient taking differently: Take 40 mg by mouth in the morning. 12/06/22   Leotis Bogus, MD  MAGNESIUM  OXIDE 400 PO Take 1 tablet by mouth in the morning and at bedtime.    [provider]  metoprolol  tartrate (LOPRESSOR ) 25 MG tablet Take 0.5 tablets (12.5 mg total) by mouth 2 (two) times daily. 02/01/19 08/29/23  Vernon Ranks, MD  nicotine  (NICODERM CQ  - DOSED IN MG/24 HOURS) 21 mg/24hr patch Place 21 mg onto the skin as needed.    [provider]  omeprazole  (PRILOSEC  OTC) 20 MG tablet Take 20 mg by mouth in the morning.    [provider]  ondansetron  (ZOFRAN ) 4 MG tablet Take 1 tablet (4 mg total) by mouth every 6 (six) hours as needed for nausea. 09/01/23   Sheikh, Omair Latif, DO  polyethylene glycol (MIRALAX  / GLYCOLAX ) 17 g packet Take 17 g by mouth daily. 09/01/23   Sheikh, Omair Latif, DO  senna-docusate (SENOKOT-S) 8.6-50 MG tablet Take 1 tablet by mouth at bedtime. 09/01/23   Sherrill Cable Latif, DO  sertraline  (ZOLOFT ) 25 MG tablet Take 1 tablet (25 mg total) by mouth at bedtime. 12/05/22 08/29/23  Leotis Bogus, MD  spironolactone  (ALDACTONE ) 25 MG tablet Take 1 tablet (25 mg total) by mouth daily. 09/02/23   Sherrill Cable Donovan, DO    Physical Exam: BP (!) 156/88 (BP Location: Right Arm)   Pulse 85   Temp 99.5 F (37.5 C) (Oral)   Resp 11   Ht 5' 4 (1.626 m)   Wt 61.2 kg   LMP 09/25/2005 (Approximate)   SpO2 93%   BMI 23.17 kg/m   General: 68 y.o. year-old female well developed well nourished in no acute distress.  Alert and oriented x3. Cardiovascular: Regular rate and rhythm with no rubs or gallops.  No thyromegaly or JVD noted.  Trace lower extremity edema bilaterally Respiratory: Mild rales at bases. Good inspiratory effort. Abdomen: Soft nontender nondistended with normal bowel sounds x4 quadrants. Muskuloskeletal: No cyanosis or clubbing noted bilaterally Neuro: CN  II-XII intact, strength, sensation, reflexes Skin: No ulcerative lesions noted or rashes Psychiatry: Judgement and insight appear normal. Mood is appropriate for condition and setting          Labs on Admission:  Basic Metabolic Panel: Recent Labs  Lab 12/21/23 1907 12/21/23 1913  NA 127* 129*  K 4.5 4.7  CL 93*  --   CO2 22  --   GLUCOSE 109*  --   BUN 12  --   CREATININE 1.13*  --   CALCIUM  8.5*  --    Liver Function Tests: Recent Labs  Lab 12/21/23 1907  AST 48*  ALT 26  ALKPHOS 140*  BILITOT 1.2  PROT 8.6*  ALBUMIN 3.5   No results for input(s): LIPASE, AMYLASE in the last 168 hours. No results for input(s): AMMONIA  in the last 168 hours. CBC: Recent Labs  Lab 12/21/23 1907 12/21/23 1913  WBC 11.4*  --   HGB 8.4* 10.9*  HCT 30.4* 32.0*  MCV 70.9*  --   PLT 343  --    Cardiac Enzymes: No results for input(s): CKTOTAL, CKMB, CKMBINDEX, TROPONINI in the last 168 hours.  BNP (last 3 results) Recent Labs    08/29/23 2120 12/21/23 1907  BNP 314.3* 1,251.5*    ProBNP (last 3 results) No results for input(s): PROBNP in the last 8760 hours.  CBG: No results for input(s): GLUCAP in the last 168 hours.  Radiological Exams on Admission: DG Chest Port 1 View Result Date: 12/21/2023 CLINICAL DATA:  Dyspnea.  History of COPD. EXAM: PORTABLE CHEST 1 VIEW COMPARISON:  09/01/2023 FINDINGS: Cardiac enlargement. Diffuse interstitial pattern to the lungs similar to prior study and likely representing diffuse pulmonary fibrosis. Peribronchial thickening suggesting chronic bronchitis. Suggestion of increased airspace disease developing in the right lower lung since the previous study which could represent superimposed pneumonia or alveolitis. No pleural effusion or pneumothorax. Mediastinal contours appear intact. Calcification of the aorta. Old rib fractures. IMPRESSION: 1. Cardiac enlargement. 2. Chronic interstitial process in the lungs similar to  prior study, likely diffuse pulmonary fibrosis. Chronic bronchitic changes. 3. Possible superimposed alveolar infiltrates in the left lower lung. Electronically Signed   By: Elsie Gravely M.D.   On: 12/21/2023 19:44    EKG: I independently viewed the EKG done and my findings are as followed: Normal sinus rhythm rate of 89.  Nonspecific ST-T changes.  QTc 462  Assessment/Plan Present on Admission:  Acute on chronic hypoxic respiratory failure (HCC)  Principal Problem:   Acute on chronic hypoxic respiratory failure (HCC)  Acute on chronic hypoxic respiratory failure secondary to COPD exacerbation superimposed by acute on chronic HFpEF. Continue diuresing Continue bronchodilator nebulizer IV azithromycin  Incentive spirometer Early ambulation Counseled on tobacco cessation Maintain O2 saturation above 90%  Acute on chronic HFpEF BNP greater than 1200, right JVD, cardiomegaly mild pulmonary edema on x-ray Last 2D echo 11/18/2022 revealed LVEF 65 to 70% Follow transthoracic echocardiogram Strict I's and O's and daily weight Continue diuresing  COPD exacerbation No significant wheezing noted on exam Management as stated above.  Tobacco use disorder Counseled on tobacco cessation Requesting nicotine  patch  Hypomagnesemia Magnesium  1.6 Repleted intravenously   Critical care time: 55 minutes.   DVT prophylaxis: Subcu Lovenox  daily.  Code Status: Full code.  Family Communication: None at bedside.  Disposition Plan: Admitted to progressive care unit.  Consults called: None.  Admission status: Inpatient status.   Status is: Inpatient The patient requires at least 2 midnights for further evaluation and treatment of present condition.   [place] LOISE Hurst MD [facility] Pager [phone]  If 7PM-7AM, please contact night-coverage [email or web address] Password [email or web address]  12/21/2023, 8:42 PM

## 2023-12-21 NOTE — ED Provider Notes (Signed)
  EMERGENCY DEPARTMENT AT [facility] Provider Note   CSN: [id number] Arrival date & time: 12/21/23  8146     Patient presents with: Shortness of Breath   Kathryn Banks is a 68 y.o. female.    Shortness of Breath    Patient has a history of COPD hypertension arthritis hepatitis.  Patient continues to smoke cigarettes.  She is on 4 L nasal cannula chronically.  Patient states she was out shopping today.  She started have trouble with nausea.  She then began feeling diaphoretic and short of breath.  Patient was noted to have decreased O2 sats.  She also had an episode of emesis.  She does not have any abdominal pain.  No fevers.  Is not having any chest pain  Prior to Admission medications   Medication Sig Start Date End Date Taking? Authorizing Provider  albuterol  (PROVENTIL ) (2.5 MG/3ML) 0.083% nebulizer solution Take 3 mLs (2.5 mg total) by nebulization every 2 (two) hours as needed for wheezing. 09/01/23   Sherrill Cable Latif, DO  ALPRAZolam  (XANAX ) 1 MG tablet Take 1 mg by mouth as needed for anxiety.    [provider]  amLODipine  (NORVASC ) 5 MG tablet Take 1 tablet (5 mg total) by mouth daily. Patient taking differently: Take 5 mg by mouth in the morning. 02/01/19 08/29/23  Vernon Ranks, MD  baclofen  (LIORESAL ) 20 MG tablet Take 20 mg by mouth 3 (three) times daily as needed for muscle spasms. 01/06/23   [provider]  benzonatate  (TESSALON ) 100 MG capsule Take 1 capsule (100 mg total) by mouth 3 (three) times daily as needed for cough. 09/01/23   Sheikh, Omair Latif, DO  budesonide -glycopyrrolate -formoterol  (BREZTRI  AEROSPHERE) 160-9-4.8 MCG/ACT AERO inhaler Inhale 2 puffs into the lungs in the morning and at bedtime. 09/01/23   Sherrill Cable Latif, DO  cyanocobalamin  (VITAMIN B12) 1000 MCG tablet Take 1,000 mcg by mouth in the morning.    [provider]  diphenhydramine -acetaminophen  (TYLENOL  PM) 25-500 MG TABS tablet Take 1 tablet by  mouth at bedtime.    [provider]  furosemide  (LASIX ) 40 MG tablet Take 1 tablet (40 mg total) by mouth daily. Patient taking differently: Take 40 mg by mouth in the morning. 12/06/22   Leotis Bogus, MD  MAGNESIUM  OXIDE 400 PO Take 1 tablet by mouth in the morning and at bedtime.    [provider]  metoprolol  tartrate (LOPRESSOR ) 25 MG tablet Take 0.5 tablets (12.5 mg total) by mouth 2 (two) times daily. 02/01/19 08/29/23  Vernon Ranks, MD  nicotine  (NICODERM CQ  - DOSED IN MG/24 HOURS) 21 mg/24hr patch Place 21 mg onto the skin as needed.    [provider]  omeprazole  (PRILOSEC  OTC) 20 MG tablet Take 20 mg by mouth in the morning.    [provider]  ondansetron  (ZOFRAN ) 4 MG tablet Take 1 tablet (4 mg total) by mouth every 6 (six) hours as needed for nausea. 09/01/23   Sheikh, Omair Latif, DO  polyethylene glycol (MIRALAX  / GLYCOLAX ) 17 g packet Take 17 g by mouth daily. 09/01/23   Sheikh, Omair Latif, DO  senna-docusate (SENOKOT-S) 8.6-50 MG tablet Take 1 tablet by mouth at bedtime. 09/01/23   Sherrill Cable Latif, DO  sertraline  (ZOLOFT ) 25 MG tablet Take 1 tablet (25 mg total) by mouth at bedtime. 12/05/22 08/29/23  Leotis Bogus, MD  spironolactone  (ALDACTONE ) 25 MG tablet Take 1 tablet (25 mg total) by mouth daily. 09/02/23   Sherrill Cable Donovan, DO  Allergies: Patient has no known allergies.    Review of Systems  Respiratory:  Positive for shortness of breath.     Updated Vital Signs BP (!) 156/88 (BP Location: Right Arm)   Pulse 85   Temp 99.5 F (37.5 C) (Oral)   Resp 11   Ht 1.626 m (5' 4)   Wt 61.2 kg   LMP 09/25/2005 (Approximate)   SpO2 93%   BMI 23.17 kg/m   Physical Exam Vitals and nursing note reviewed.  Constitutional:      Appearance: She is well-developed. She is ill-appearing.  HENT:     Head: Normocephalic and atraumatic.     Right Ear: External ear normal.     Left Ear: External ear normal.  Eyes:     General: No  scleral icterus.       Right eye: No discharge.        Left eye: No discharge.     Conjunctiva/sclera: Conjunctivae normal.  Neck:     Trachea: No tracheal deviation.  Cardiovascular:     Rate and Rhythm: Normal rate and regular rhythm.  Pulmonary:     Effort: Pulmonary effort is normal. No respiratory distress.     Breath sounds: No stridor. Decreased breath sounds and wheezing present. No rales.  Abdominal:     General: Bowel sounds are normal. There is no distension.     Palpations: Abdomen is soft.     Tenderness: There is no abdominal tenderness. There is no guarding or rebound.  Musculoskeletal:        General: No tenderness or deformity.     Cervical back: Neck supple.     Right lower leg: No edema.     Left lower leg: No edema.  Skin:    General: Skin is warm and dry.     Findings: No rash.  Neurological:     General: No focal deficit present.     Mental Status: She is alert.     Cranial Nerves: No cranial nerve deficit, dysarthria or facial asymmetry.     Sensory: No sensory deficit.     Motor: No abnormal muscle tone or seizure activity.     Coordination: Coordination normal.  Psychiatric:        Mood and Affect: Mood normal.     (all labs ordered are listed, but only abnormal results are displayed) Labs Reviewed  COMPREHENSIVE METABOLIC PANEL WITH GFR - Abnormal; Notable for the following components:      Result Value   Sodium 127 (*)    Chloride 93 (*)    Glucose, Bld 109 (*)    Creatinine, Ser 1.13 (*)    Calcium  8.5 (*)    Total Protein 8.6 (*)    AST 48 (*)    Alkaline Phosphatase 140 (*)    GFR, Estimated 54 (*)    All other components within normal limits  CBC - Abnormal; Notable for the following components:   WBC 11.4 (*)    Hemoglobin 8.4 (*)    HCT 30.4 (*)    MCV 70.9 (*)    MCH 19.6 (*)    MCHC 27.6 (*)    RDW 19.1 (*)    All other components within normal limits  BRAIN NATRIURETIC PEPTIDE - Abnormal; Notable for the following  components:   B Natriuretic Peptide 1,251.5 (*)    All other components within normal limits  I-STAT VENOUS BLOOD GAS, ED - Abnormal; Notable for the following components:   pCO2, Teodora  39.6 (*)    pO2, Ven 29 (*)    Sodium 129 (*)    Calcium , Ion 1.06 (*)    HCT 32.0 (*)    Hemoglobin 10.9 (*)    All other components within normal limits  TROPONIN I (HIGH SENSITIVITY) - Abnormal; Notable for the following components:   Troponin I (High Sensitivity) 24 (*)    All other components within normal limits  RESP PANEL BY RT-PCR (RSV, FLU A&B, COVID)  RVPGX2  LIPASE, BLOOD  MAGNESIUM     EKG: None  Radiology: [facility] Chest Port 1 View Result Date: 12/21/2023 CLINICAL DATA:  Dyspnea.  History of COPD. EXAM: PORTABLE CHEST 1 VIEW COMPARISON:  09/01/2023 FINDINGS: Cardiac enlargement. Diffuse interstitial pattern to the lungs similar to prior study and likely representing diffuse pulmonary fibrosis. Peribronchial thickening suggesting chronic bronchitis. Suggestion of increased airspace disease developing in the right lower lung since the previous study which could represent superimposed pneumonia or alveolitis. No pleural effusion or pneumothorax. Mediastinal contours appear intact. Calcification of the aorta. Old rib fractures. IMPRESSION: 1. Cardiac enlargement. 2. Chronic interstitial process in the lungs similar to prior study, likely diffuse pulmonary fibrosis. Chronic bronchitic changes. 3. Possible superimposed alveolar infiltrates in the left lower lung. Electronically Signed   By: Elsie Gravely M.D.   On: 12/21/2023 19:44     .Critical Care  Performed by: Randol Simmonds, MD Authorized by: Randol Simmonds, MD   Critical care provider statement:    Critical care time (minutes):  30   Critical care was time spent personally by me on the following activities:  Development of treatment plan with patient or surrogate, discussions with consultants, evaluation of patient's response to treatment,  examination of patient, ordering and review of laboratory studies, ordering and review of radiographic studies, ordering and performing treatments and interventions, pulse oximetry, re-evaluation of patient's condition and review of old charts    Medications Ordered in the ED  ondansetron  (ZOFRAN ) injection 4 mg (4 mg Intravenous Given 12/21/23 1909)  methylPREDNISolone  sodium succinate (SOLU-MEDROL ) 125 mg/2 mL injection 125 mg (125 mg Intravenous Given 12/21/23 1909)  albuterol  (PROVENTIL ) (2.5 MG/3ML) 0.083% nebulizer solution (10 mg/hr Nebulization Given 12/21/23 1909)  ipratropium (ATROVENT ) nebulizer solution 0.5 mg (0.5 mg Nebulization Given 12/21/23 1909)  furosemide  (LASIX ) injection 40 mg (40 mg Intravenous Given 12/21/23 2032)  magnesium  oxide (MAG-OX) tablet 400 mg (400 mg Oral Given 12/21/23 2032)    Clinical Course as of 12/21/23 2035  Mon Dec 21, 2023  2020 CBC(!) CBC shows stable anemia.  BNP is significantly elevated at 1251.  Troponin elevated at 24 although decreased compared to previous [JK]  2020 Chest x-ray shows chronic interstitial process chronic bronchitic changes cardiac enlargement [JK]  2024 Patient requesting a dose of magnesium  for her muscle cramps.  She usually takes it at home [JK]  2035 Case discussed with Dr. Shona regarding admission [JK]    Clinical Course User Index [JK] Randol Simmonds, MD                                 Medical Decision Making Problems Addressed: Congestive heart failure, unspecified HF chronicity, unspecified heart failure type [facility]): acute illness or injury that poses a threat to life or bodily functions COPD exacerbation (HCC): acute illness or injury that poses a threat to life or bodily functions  Amount and/or Complexity of Data Reviewed Labs: ordered. Decision-making details documented in ED Course. Radiology:  ordered and independent interpretation performed.  Risk OTC drugs. Prescription drug management. Decision regarding  hospitalization.   Patient presented to the ED with complaints of acute shortness of breath, nausea.  Consider the possibility of CHF, ACS, COPD, pneumonia.  Patient is not having any abdominal pain and her abdominal exam is benign.  Doubt acute abdominal process.  Patient's ED workup is notable for elevated BNP, decreased venous pO2 and mildly elevated troponins although similar to previous values.  Patient was treated for COPD exacerbation with steroids breathing treatments.  She was also given antinausea medications.  Symptoms improved.  With her elevated BNP and cardiomegaly I have also ordered a dose of Lasix  for a component of CHF.  Will plan on admission to the hospital for further treatment.     Final diagnoses:  COPD exacerbation (HCC)  Congestive heart failure, unspecified HF chronicity, unspecified heart failure type [facility])    ED Discharge Orders     None          Randol Simmonds, MD 12/21/23 2037

## 2023-12-22 ENCOUNTER — Inpatient Hospital Stay (HOSPITAL_COMMUNITY)

## 2023-12-22 DIAGNOSIS — I5033 Acute on chronic diastolic (congestive) heart failure: Secondary | ICD-10-CM

## 2023-12-22 LAB — CBC
HCT: 29.8 % — ABNORMAL LOW (ref 36.0–46.0)
Hemoglobin: 8.4 g/dL — ABNORMAL LOW (ref 12.0–15.0)
MCH: 19.6 pg — ABNORMAL LOW (ref 26.0–34.0)
MCHC: 28.2 g/dL — ABNORMAL LOW (ref 30.0–36.0)
MCV: 69.5 fL — ABNORMAL LOW (ref 80.0–100.0)
Platelets: 294 K/uL (ref 150–400)
RBC: 4.29 MIL/uL (ref 3.87–5.11)
RDW: 19.3 % — ABNORMAL HIGH (ref 11.5–15.5)
WBC: 6.7 K/uL (ref 4.0–10.5)
nRBC: 0 % (ref 0.0–0.2)

## 2023-12-22 LAB — BASIC METABOLIC PANEL WITH GFR
Anion gap: 13 (ref 5–15)
BUN: 14 mg/dL (ref 8–23)
CO2: 21 mmol/L — ABNORMAL LOW (ref 22–32)
Calcium: 8.4 mg/dL — ABNORMAL LOW (ref 8.9–10.3)
Chloride: 95 mmol/L — ABNORMAL LOW (ref 98–111)
Creatinine, Ser: 1.02 mg/dL — ABNORMAL HIGH (ref 0.44–1.00)
GFR, Estimated: >60 mL/min (ref >60)
Glucose, Bld: 157 mg/dL — ABNORMAL HIGH (ref 70–99)
Potassium: 4.5 mmol/L (ref 3.5–5.1)
Sodium: 129 mmol/L — ABNORMAL LOW (ref 135–145)

## 2023-12-22 LAB — ECHOCARDIOGRAM COMPLETE
Calc EF: 66.8 %
Height: 64 in
Single Plane A2C EF: 63.4 %
Single Plane A4C EF: 69.6 %
Weight: 2160 [oz_av]

## 2023-12-22 LAB — PHOSPHORUS: Phosphorus: 4 mg/dL (ref 2.5–4.6)

## 2023-12-22 MED ORDER — PANTOPRAZOLE SODIUM 40 MG PO TBEC
40.0000 mg | DELAYED_RELEASE_TABLET | Freq: Every day | ORAL | Status: DC
  Administered 2023-12-22 – 2023-12-25 (×4): 40 mg via ORAL
  Filled 2023-12-22 (×4): qty 1

## 2023-12-22 MED ORDER — MAGNESIUM SULFATE 2 GM/50ML IV SOLN
2.0000 g | Freq: Once | INTRAVENOUS | Status: AC
  Administered 2023-12-22: 2 g via INTRAVENOUS
  Filled 2023-12-22: qty 50

## 2023-12-22 MED ORDER — PREDNISONE 20 MG PO TABS
40.0000 mg | ORAL_TABLET | Freq: Every day | ORAL | Status: DC
  Administered 2023-12-23 – 2023-12-25 (×3): 40 mg via ORAL
  Filled 2023-12-22 (×3): qty 2

## 2023-12-22 MED ORDER — SERTRALINE HCL 50 MG PO TABS
25.0000 mg | ORAL_TABLET | Freq: Every day | ORAL | Status: DC
  Administered 2023-12-22 – 2023-12-24 (×3): 25 mg via ORAL
  Filled 2023-12-22 (×3): qty 1

## 2023-12-22 MED ORDER — BENZONATATE 100 MG PO CAPS
100.0000 mg | ORAL_CAPSULE | Freq: Three times a day (TID) | ORAL | Status: DC | PRN
Start: 2023-12-22 — End: 2023-12-25
  Administered 2023-12-24: 100 mg via ORAL
  Filled 2023-12-22: qty 1

## 2023-12-22 MED ORDER — FUROSEMIDE 10 MG/ML IJ SOLN
40.0000 mg | Freq: Two times a day (BID) | INTRAMUSCULAR | Status: DC
  Administered 2023-12-22 – 2023-12-23 (×3): 40 mg via INTRAVENOUS
  Filled 2023-12-22 (×3): qty 4

## 2023-12-22 MED ORDER — LORAZEPAM 2 MG/ML IJ SOLN
1.0000 mg | Freq: Once | INTRAMUSCULAR | Status: AC
  Administered 2023-12-22: 1 mg via INTRAVENOUS
  Filled 2023-12-22: qty 1

## 2023-12-22 MED ORDER — ALPRAZOLAM 0.5 MG PO TABS
1.0000 mg | ORAL_TABLET | Freq: Every day | ORAL | Status: DC
Start: 2023-12-22 — End: 2023-12-25
  Administered 2023-12-22 – 2023-12-24 (×3): 1 mg via ORAL
  Filled 2023-12-22 (×3): qty 2

## 2023-12-22 NOTE — Progress Notes (Signed)
 Progress Note   Patient: MANVI GUILLIAMS FMW:[id number] DOB: 11-12-1955 DOA: 12/21/2023     1 DOS: the patient was seen and examined on 12/22/2023   Brief hospital course: 68 y.o. female with medical history significant for advanced COPD on 4 L oxygen  by nasal cannula continuously, current tobacco user 1 pack/day, hypertension, history of hepatitis C, cirrhosis, hypertension, anxiety disorder, GERD, presents to the ER with complaints of shortness of breath, worse with ambulation.  Symptoms started today.  Associated with 1 episode of emesis.  No reported subjective fevers or chills.  Endorses a nonproductive cough. In the ER, requiring high flow nasal cannula 8 L to maintain O2 saturation above 90%.  Right JVD on exam.  Elevated BNP greater than 1200.  Cardiomegaly and mild pulm edema on chest x-ray.  The patient received IV Lasix  in the ER as well as bronchodilator nebulizers.  Due to concern for COPD and CHF exacerbation, [facility] hospitalist service was asked to admit.  Assessment and Plan: Acute on chronic hypoxic respiratory failure secondary to COPD exacerbation superimposed by acute on chronic HFpE: Continue diuresing -- on 40 mg lasix  at home.  Increasing to 40 mg IV BID x 1 day, then reassess to see whether BID or daily dosing necessary moving forward.   Continue bronchodilator nebulizer IV azithromycin  for COPD exacerbation.  No evidence of PNA clinically or on 1 view CXR.  If worsens, fevers occur, order 2V CXR vs CT lungs.  Incentive spirometer Early ambulation Counseled on tobacco cessation Maintain O2 saturation above 88-92%.  Remains on 6L O2 this AM, wean as possible.    Acute on chronic HFpEF BNP greater than 1200, right JVD, cardiomegaly mild pulmonary edema on x-ray Last 2D echo 11/18/2022 revealed LVEF 65 to 70% Follow transthoracic echocardiogram-->still pending.  Strict I's and O's and daily weight Continue diuresing as above.   Denies chest pain.  Initial troponin 24, already  downtrending, EKG without signs of ischemia so doesn't appear to be ACS cause of SOB/pulmonary edema.    COPD exacerbation Now with significant wheezing Unclear trigger.  Ongoing smoker.  Does report exerting herself more than usual yesterday at grocery story, wheezing started and she wasn't able to break it on her own.  Also reports gradual increasing dyspnea over past 3-4 days superimposed on yesterday.  Hyponatremia:   - chronic for many years, since at least 2020 if not earlier  - asymptomatic.   - Current level 129.  Likely component of fluid overload superimposed on her chronic hyponatremia - Continue to trend and diurese  AKI: - unclear cause, other than possible physiologic stress - continue trending. - watch Cr in light of ongoing diuresis.    Microcytic anemia:  - longstanding since 2024. - iron  deficient in past - start iron  once we're sure not PNA, possibly hold until discharge unless she continues to improve, then would start prior to DC   Tobacco use disorder Counseled on tobacco cessation Requesting nicotine  patch   Hypomagnesemia Magnesium  1.6 Replaced intravenously  Anxiety: - take sertraline  daily and xanax  at night to help with sleep.   - restarted.    HTN:   - BPs good this AM.  Hold antihypertensives as we increase diuresis, restart if BP rises.        Subjective: Patient complaining of some anxiety.  Breathing not yet to baseline, but better than day of admission.  Still with cough.    Physical Exam: Vitals:   12/22/23 0717 12/22/23 1000 12/22/23 1030 12/22/23  1100  BP:  125/81 131/70   Pulse:  83 76   Resp:  15 16   Temp: 98.2 F (36.8 C)   97.8 F (36.6 C)  TempSrc: Oral   Oral  SpO2:  92% 92%   Weight:      Height:       Gen:  Alert, cooperative patient who appears stated age in no acute distress.  Vital signs reviewed. Heart: RRR Lungs:  diffuse wheezing throughout Abd:  S/ND/NT Ext:  No LE edema Neuro:  A&O x 4.  No notable  focal deficits, moving all limbs symmetrically Psych:  appears calm on my exam.     Disposition: Status is: Inpatient  Planned Discharge Destination: Home    Time spent: 55 minutes  Author: Reyes VEAR Gaw, MD 12/22/2023 11:51 AM  For on call review [email or web address].

## 2023-12-22 NOTE — ED Notes (Signed)
 Echo at bedside

## 2023-12-22 NOTE — Progress Notes (Signed)
  Echocardiogram 2D Echocardiogram has been performed.  Kathryn Banks 12/22/2023, 4:42 PM

## 2023-12-23 ENCOUNTER — Ambulatory Visit: Admitting: Physician Assistant

## 2023-12-23 DIAGNOSIS — J9621 Acute and chronic respiratory failure with hypoxia: Secondary | ICD-10-CM | POA: Diagnosis not present

## 2023-12-23 LAB — CBC
HCT: 28.6 % — ABNORMAL LOW (ref 36.0–46.0)
Hemoglobin: 8 g/dL — ABNORMAL LOW (ref 12.0–15.0)
MCH: 19.4 pg — ABNORMAL LOW (ref 26.0–34.0)
MCHC: 28 g/dL — ABNORMAL LOW (ref 30.0–36.0)
MCV: 69.2 fL — ABNORMAL LOW (ref 80.0–100.0)
Platelets: 316 K/uL (ref 150–400)
RBC: 4.13 MIL/uL (ref 3.87–5.11)
RDW: 19.7 % — ABNORMAL HIGH (ref 11.5–15.5)
WBC: 6.4 K/uL (ref 4.0–10.5)
nRBC: 0 % (ref 0.0–0.2)

## 2023-12-23 LAB — IRON AND TIBC
Iron: 14 ug/dL — ABNORMAL LOW (ref 28–170)
Saturation Ratios: 2 % — ABNORMAL LOW (ref 10.4–31.8)
TIBC: 602 ug/dL — ABNORMAL HIGH (ref 250–450)
UIBC: 588 ug/dL

## 2023-12-23 LAB — MAGNESIUM: Magnesium: 2 mg/dL (ref 1.7–2.4)

## 2023-12-23 LAB — FERRITIN: Ferritin: 8 ng/mL — ABNORMAL LOW (ref 11–307)

## 2023-12-23 LAB — BASIC METABOLIC PANEL WITH GFR
Anion gap: 9 (ref 5–15)
BUN: 22 mg/dL (ref 8–23)
CO2: 29 mmol/L (ref 22–32)
Calcium: 8.5 mg/dL — ABNORMAL LOW (ref 8.9–10.3)
Chloride: 97 mmol/L — ABNORMAL LOW (ref 98–111)
Creatinine, Ser: 1.08 mg/dL — ABNORMAL HIGH (ref 0.44–1.00)
GFR, Estimated: 57 mL/min — ABNORMAL LOW (ref >60)
Glucose, Bld: 94 mg/dL (ref 70–99)
Potassium: 4.1 mmol/L (ref 3.5–5.1)
Sodium: 135 mmol/L (ref 135–145)

## 2023-12-23 LAB — RETICULOCYTES
Immature Retic Fract: 26.2 % — ABNORMAL HIGH (ref 2.3–15.9)
RBC.: 4.5 MIL/uL (ref 3.87–5.11)
Retic Count, Absolute: 70.2 K/uL (ref 19.0–186.0)
Retic Ct Pct: 1.6 % (ref 0.4–3.1)

## 2023-12-23 LAB — FOLATE: Folate: 5.4 ng/mL — ABNORMAL LOW (ref >5.9)

## 2023-12-23 LAB — VITAMIN B12: Vitamin B-12: 3862 pg/mL — ABNORMAL HIGH (ref 180–914)

## 2023-12-23 MED ORDER — IPRATROPIUM-ALBUTEROL 0.5-2.5 (3) MG/3ML IN SOLN
3.0000 mL | Freq: Two times a day (BID) | RESPIRATORY_TRACT | Status: DC
  Administered 2023-12-24 – 2023-12-25 (×3): 3 mL via RESPIRATORY_TRACT
  Filled 2023-12-23 (×3): qty 3

## 2023-12-23 MED ORDER — METHOCARBAMOL 500 MG PO TABS
500.0000 mg | ORAL_TABLET | Freq: Three times a day (TID) | ORAL | Status: DC | PRN
  Administered 2023-12-23: 500 mg via ORAL
  Filled 2023-12-23: qty 1

## 2023-12-23 MED ORDER — GUAIFENESIN ER 600 MG PO TB12
600.0000 mg | ORAL_TABLET | Freq: Two times a day (BID) | ORAL | Status: DC
  Administered 2023-12-23 – 2023-12-25 (×5): 600 mg via ORAL
  Filled 2023-12-23 (×5): qty 1

## 2023-12-23 MED ORDER — METOPROLOL TARTRATE 12.5 MG HALF TABLET
12.5000 mg | ORAL_TABLET | Freq: Two times a day (BID) | ORAL | Status: DC
Start: 2023-12-23 — End: 2023-12-25
  Administered 2023-12-23 – 2023-12-25 (×5): 12.5 mg via ORAL
  Filled 2023-12-23 (×5): qty 1

## 2023-12-23 NOTE — Progress Notes (Signed)
 Patient is at 90% at rest on 4L, but desaturates quickly on exertion.

## 2023-12-23 NOTE — Plan of Care (Signed)
   Problem: Clinical Measurements: Goal: Ability to maintain clinical measurements within normal limits will improve Outcome: Progressing

## 2023-12-23 NOTE — Progress Notes (Signed)
 Heart Failure Navigator Progress Note  Assessed for Heart & Vascular TOC clinic readiness.  Patient does not meet criteria due to current Heart Failure Clinic patient of Sabina Custovic, DO @ [facility].  Navigator will sign off at this time.  Charmaine Pines, RN, BSN [facility] Heart Failure Navigator Secure Chat Only

## 2023-12-23 NOTE — Progress Notes (Deleted)
 Ellouise Console, PA-C [address] [place], [place]  [address] Phone: [phone]   Gastroenterology Consultation  Referring Provider:     Zachary Idelia LABOR, MD Primary Care Physician:  Zachary Idelia LABOR, MD Primary Gastroenterologist:  Ellouise Console, PA-C / *** Reason for Consultation:     ***        HPI:   Kathryn Banks is a 68 y.o. y/o female referred for consultation & management  by Zachary Idelia LABOR, MD.    New patient referred to evaluate cirrhosis.  History of alcohol use disorder.  Medical history significant for advanced COPD on 4 L oxygen  by nasal cannula continuously, current tobacco user 1 pack/day, hypertension, history of hepatitis C, cirrhosis, hypertension, anxiety disorder, GERD, presents to the ER with complaints of shortness of breath, worse with ambulation.  Admitted to [facility] 12/21/2023 for COPD exacerbation.  Past Medical History:  Diagnosis Date   Acid reflux    Arthritis    FINGERS   COPD (chronic obstructive pulmonary disease) (HCC)    Depression    ETOH abuse    Hepatitis    C-PT STATED SHE WAS TOLD THIS 2014   Hypertension    OFF MEDS FOR FEW YEARS    Past Surgical History:  Procedure Laterality Date   CESAREAN SECTION     OPEN REDUCTION INTERNAL FIXATION (ORIF) DISTAL RADIAL FRACTURE Right 10/15/2017   Procedure: OPEN REDUCTION INTERNAL FIXATION (ORIF) DISTAL RADIAL FRACTURE;  Surgeon: Kathlynn Sharper, MD;  Location: [facility] ORS;  Service: Orthopedics;  Laterality: Right;    Prior to Admission medications   Medication Sig Start Date End Date Taking? Authorizing Provider  albuterol  (PROVENTIL ) (2.5 MG/3ML) 0.083% nebulizer solution Take 3 mLs (2.5 mg total) by nebulization every 2 (two) hours as needed for wheezing. Patient taking differently: Take 2.5 mg by nebulization daily as needed for wheezing or shortness of breath. 09/01/23   Sherrill Cable Latif, DO  ALPRAZolam  (XANAX ) 1 MG tablet Take 1 mg by mouth at bedtime.    [provider]  amLODipine  (NORVASC ) 5 MG tablet Take 1 tablet (5 mg total) by mouth daily. 02/01/19 12/22/23  Vernon Ranks, MD  baclofen  (LIORESAL ) 20 MG tablet Take 20 mg by mouth every 6 (six) hours. 01/06/23   [provider]  benzonatate  (TESSALON ) 100 MG capsule Take 1 capsule (100 mg total) by mouth 3 (three) times daily as needed for cough. Patient not taking: Reported on 12/22/2023 09/01/23   Sherrill Cable Latif, DO  budesonide -glycopyrrolate -formoterol  (BREZTRI  AEROSPHERE) 160-9-4.8 MCG/ACT AERO inhaler Inhale 2 puffs into the lungs in the morning and at bedtime. Patient not taking: Reported on 12/22/2023 09/01/23   Sherrill Cable Latif, DO  cyanocobalamin  (VITAMIN B12) 1000 MCG tablet Take 1,000 mcg by mouth in the morning.    [provider]  diphenhydramine -acetaminophen  (TYLENOL  PM) 25-500 MG TABS tablet Take 2 tablets by mouth at bedtime.    [provider]  furosemide  (LASIX ) 40 MG tablet Take 1 tablet (40 mg total) by mouth daily. 12/06/22   Leotis Bogus, MD  MAGNESIUM  OXIDE 400 PO Take 1 tablet by mouth in the morning and at bedtime.    [provider]  metoprolol  tartrate (LOPRESSOR ) 25 MG tablet Take 0.5 tablets (12.5 mg total) by mouth 2 (two) times daily. 02/01/19 12/22/23  Vernon Ranks, MD  nicotine  (NICODERM CQ  - DOSED IN MG/24 HOURS) 21 mg/24hr patch Place 21 mg onto the skin as needed. Patient not taking: Reported on  12/22/2023    [provider]  omeprazole  (PRILOSEC  OTC) 20 MG tablet Take 20 mg by mouth daily.    [provider]  ondansetron  (ZOFRAN ) 4 MG tablet Take 1 tablet (4 mg total) by mouth every 6 (six) hours as needed for nausea. 09/01/23   Sheikh, Omair Latif, DO  senna-docusate (SENOKOT-S) 8.6-50 MG tablet Take 1 tablet by mouth at bedtime. Patient taking differently: Take 1 tablet by mouth 2 (two) times daily. 09/01/23   Sherrill Cable Latif, DO  sertraline  (ZOLOFT ) 25 MG tablet Take 1 tablet (25 mg total) by mouth at  bedtime. 12/05/22 12/22/23  Leotis Bogus, MD  SPIRIVA  RESPIMAT 2.5 MCG/ACT AERS SMARTSIG:2 inhalation Via Inhaler Daily 12/06/23   [provider]  spironolactone  (ALDACTONE ) 25 MG tablet Take 1 tablet (25 mg total) by mouth daily. 09/02/23   Sherrill Cable Donovan, DO    Family History  Problem Relation Age of Onset   Hypertension Mother    Ulcers Mother        Recent bleeding ulcers requiring transfusion, 2020   Cancer Father    Hypertension Father    CAD Father 88   CVA Sister 72   Breast cancer Neg Hx      Social History   Tobacco Use   Smoking status: Every Day    Current packs/day: 0.50    Average packs/day: 1 pack/day for 46.1 years (45.5 ttl pk-yrs)    Types: Cigarettes    Start date: 11/28/2022   Smokeless tobacco: Never  Vaping Use   Vaping status: Never Used  Substance Use Topics   Alcohol use: Not Currently    Comment: OCC -- beer and vodka; 1-2 every few days; Off of alcohol for 2 months   Drug use: Not Currently    Types: Opium, Benzodiazepines, Heroin    Comment: remote history, quit about 1985    Allergies as of 12/23/2023   (No Known Allergies)    Review of Systems:    All systems reviewed and negative except where noted in HPI.   Physical Exam:  LMP 09/25/2005 (Approximate)  Patient's last menstrual period was 09/25/2005 (approximate).  General:   Alert,  Well-developed, well-nourished, pleasant and cooperative in NAD Lungs:  Respirations even and unlabored.  Clear throughout to auscultation.   No wheezes, crackles, or rhonchi. No acute distress. Heart:  Regular rate and rhythm; no murmurs, clicks, rubs, or gallops. Abdomen:  Normal bowel sounds.  No bruits.  Soft, and non-distended without masses, hepatosplenomegaly or hernias noted.  No Tenderness.  No guarding or rebound tenderness.    Neurologic:  Alert and oriented x3;  grossly normal neurologically. Psych:  Alert and cooperative. Normal mood and affect.  Imaging Studies: ECHOCARDIOGRAM  COMPLETE Result Date: 12/22/2023    ECHOCARDIOGRAM REPORT   Patient Name:   [name] Date of Exam: 12/22/2023 Medical Rec #:  [id number]     Height:       64.0 in Accession #:    [id number]    Weight:       135.0 lb Date of Birth:  [birth year]      BSA:          1.655 m Patient Age:    68 years      BP:           120/66 mmHg Patient Gender: F             HR:           82 bpm.  Exam Location:  Inpatient Procedure: 2D Echo, Cardiac Doppler and Color Doppler (Both Spectral and Color            Flow Doppler were utilized during procedure). Indications:    CHF  History:        Patient has prior history of Echocardiogram examinations, most                 recent 08/31/2023. CHF, COPD, Signs/Symptoms:Chest Pain and                 Shortness of Breath; Risk Factors:Hypertension, Current Smoker                 and Dyslipidemia.  Sonographer:    Juliene Rucks Referring Phys: [id number] CAROLE N HALL IMPRESSIONS  1. Left ventricular ejection fraction, by estimation, is 65 to 70%. The left ventricle has normal function. The left ventricle has no regional wall motion abnormalities. Left ventricular diastolic parameters were normal.  2. Right ventricular systolic function is normal. The right ventricular size is mildly enlarged.  3. Left atrial size was moderately dilated.  4. The mitral valve is normal in structure. No evidence of mitral valve regurgitation.  5. The aortic valve is tricuspid. Aortic valve regurgitation is not visualized. No aortic stenosis is present.  6. The inferior vena cava is normal in size with greater than 50% respiratory variability, suggesting right atrial pressure of 3 mmHg. Comparison(s): No significant change from prior study. Prior images reviewed side by side. FINDINGS  Left Ventricle: Left ventricular ejection fraction, by estimation, is 65 to 70%. The left ventricle has normal function. The left ventricle has no regional wall motion abnormalities. The left ventricular internal cavity size was small.  There is no left ventricular hypertrophy. Left ventricular diastolic parameters were normal. Right Ventricle: The right ventricular size is mildly enlarged. No increase in right ventricular wall thickness. Right ventricular systolic function is normal. Left Atrium: Left atrial size was moderately dilated. Right Atrium: Right atrial size was normal in size. Pericardium: Trivial pericardial effusion is present. The pericardial effusion is posterior to the left ventricle. Mitral Valve: The mitral valve is normal in structure. No evidence of mitral valve regurgitation. Tricuspid Valve: The tricuspid valve is normal in structure. Tricuspid valve regurgitation is not demonstrated. No evidence of tricuspid stenosis. Aortic Valve: The aortic valve is tricuspid. Aortic valve regurgitation is not visualized. No aortic stenosis is present. Pulmonic Valve: The pulmonic valve was normal in structure. Pulmonic valve regurgitation is not visualized. No evidence of pulmonic stenosis. Aorta: The aortic root and ascending aorta are structurally normal, with no evidence of dilitation. Venous: The inferior vena cava is normal in size with greater than 50% respiratory variability, suggesting right atrial pressure of 3 mmHg. IAS/Shunts: The atrial septum is grossly normal.   LV Volumes (MOD) LV vol d, MOD A2C: 154.0 ml LV vol d, MOD A4C: 102.0 ml LV vol s, MOD A2C: 56.4 ml LV vol s, MOD A4C: 31.0 ml LV SV MOD A2C:     97.6 ml LV SV MOD A4C:     102.0 ml LV SV MOD BP:      84.2 ml RIGHT VENTRICLE RV Basal diam:  4.18 cm RV Mid diam:    3.58 cm LEFT ATRIUM             Index        RIGHT ATRIUM           Index LA Vol (A2C):   75.2  ml 45.43 ml/m  RA Area:     19.50 cm LA Vol (A4C):   60.7 ml 36.67 ml/m  RA Volume:   53.40 ml  32.26 ml/m LA Biplane Vol: 71.0 ml 42.89 ml/m  TRICUSPID VALVE TR Peak grad:   25.4 mmHg TR Vmax:        252.00 cm/s Stanly Leavens MD Electronically signed by Stanly Leavens MD Signature Date/Time:  12/22/2023/4:45:54 PM    Final    DG Chest Port 1 View Result Date: 12/21/2023 CLINICAL DATA:  Dyspnea.  History of COPD. EXAM: PORTABLE CHEST 1 VIEW COMPARISON:  09/01/2023 FINDINGS: Cardiac enlargement. Diffuse interstitial pattern to the lungs similar to prior study and likely representing diffuse pulmonary fibrosis. Peribronchial thickening suggesting chronic bronchitis. Suggestion of increased airspace disease developing in the right lower lung since the previous study which could represent superimposed pneumonia or alveolitis. No pleural effusion or pneumothorax. Mediastinal contours appear intact. Calcification of the aorta. Old rib fractures. IMPRESSION: 1. Cardiac enlargement. 2. Chronic interstitial process in the lungs similar to prior study, likely diffuse pulmonary fibrosis. Chronic bronchitic changes. 3. Possible superimposed alveolar infiltrates in the left lower lung. Electronically Signed   By: Elsie Gravely M.D.   On: 12/21/2023 19:44    Labs: CBC    Component Value Date/Time   WBC 6.4 12/23/2023 0252   RBC 4.13 12/23/2023 0252   HGB 8.0 (L) 12/23/2023 0252   HGB 17.8 (H) 04/22/2013 0408   HCT 28.6 (L) 12/23/2023 0252   HCT 53.5 (H) 04/22/2013 0408   PLT 316 12/23/2023 0252   PLT 391 04/22/2013 0408   MCV 69.2 (L) 12/23/2023 0252   MCV 98 04/22/2013 0408    CMP     Component Value Date/Time   NA 135 12/23/2023 0252   NA 139 04/22/2013 0408   K 4.1 12/23/2023 0252   K 4.0 04/22/2013 0408   CL 97 (L) 12/23/2023 0252   CL 106 04/22/2013 0408   CO2 29 12/23/2023 0252   CO2 24 04/22/2013 0408   GLUCOSE 94 12/23/2023 0252   GLUCOSE 143 (H) 04/22/2013 0408   BUN 22 12/23/2023 0252   BUN 13 04/22/2013 0408   CREATININE 1.08 (H) 12/23/2023 0252   CREATININE 0.95 04/22/2013 0408   CALCIUM  8.5 (L) 12/23/2023 0252   CALCIUM  9.7 04/22/2013 0408   PROT 8.6 (H) 12/21/2023 1907   PROT 9.2 (H) 04/22/2013 0408   ALBUMIN 3.5 12/21/2023 1907   ALBUMIN 4.6 04/22/2013 0408    AST 48 (H) 12/21/2023 1907   AST 49 (H) 04/22/2013 0408   ALT 26 12/21/2023 1907   ALT 64 04/22/2013 0408   ALKPHOS 140 (H) 12/21/2023 1907   ALKPHOS 161 (H) 04/22/2013 0408   BILITOT 1.2 12/21/2023 1907   BILITOT 1.0 04/22/2013 0408   GFRNONAA 57 (L) 12/23/2023 0252   GFRNONAA >60 04/22/2013 0408   GFRAA >60 01/31/2019 1054   GFRAA >60 04/22/2013 0408    Assessment and Plan:   SIDONIE DEXHEIMER is a 68 y.o. y/o female has been referred for   1.  Cirrhosis 2.  Alcohol use disorder 3.  COPD  Follow up ***  Ellouise Console, PA-C

## 2023-12-23 NOTE — Progress Notes (Signed)
 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  In the ER hypoxic requiring 8 L of O2, mildly volume overloaded, BNP 1200, chest x-ray with chronic interstitial findings,?  Pulmonary fibrosis   Subjective: -Some dyspnea, a little better from yesterday  Assessment and Plan:  Acute on chronic hypoxic respiratory failure COPD exacerbation -Flu COVID, RSV PCR negative - Continue IV steroids today, add DuoNebs, azithromycin , flutter valve - Wean O2 as tolerated  Acute on chronic diastolic CHF - Last echo 9/25 with EF 65-70%, normal RV - IV Lasix  X2 doses today, resume oral diuretics tomorrow  Pulmonary fibrosis - Follow-up with Dr. Kara  Tobacco use disorder Counseled  Hypomagnesemia Repleted  Microcytic anemia - Check anemia panel    DVT prophylaxis: Lovenox  Code Status: Full code Family Communication: Boyfriend at bedside Disposition Plan: Home likely 48 hours  Consultants:    Procedures:   Antimicrobials:    Objective: Vitals:   12/23/23 0436 12/23/23 0440 12/23/23 0734 12/23/23 0811  BP:  (!) 117/94 126/64   Pulse:  74 74 92  Resp:  18 18 20   Temp:  97.8 F (36.6 C) 97.8 F (36.6 C)   TempSrc:  Oral Tympanic   SpO2:  96% 95% 95%  Weight: 60.9 kg     Height:        Intake/Output Summary (Last 24 hours) at 12/23/2023 1259 Last data filed at 12/23/2023 1027 Gross per 24 hour  Intake --  Output 1700 ml  Net -1700 ml   Filed Weights   12/21/23 1858 12/23/23 0436  Weight: 61.2 kg 60.9 kg    Examination:  General exam: Appears calm and comfortable chronically ill-appearing Respiratory system: Poor air movement, scattered rhonchi and few expiratory wheezes Cardiovascular system: S1 & S2 heard, RRR.  Abd:  nondistended, soft and nontender.Normal bowel sounds heard. Central nervous system: Alert and oriented. No focal neurological deficits. Extremities: Trace edema Skin: No rashes Psychiatry:  Mood & affect appropriate.     Data Reviewed:   CBC: Recent Labs  Lab 12/21/23 1907 12/21/23 1913 12/22/23 0508 12/23/23 0252  WBC 11.4*  --  6.7 6.4  HGB 8.4* 10.9* 8.4* 8.0*  HCT 30.4* 32.0* 29.8* 28.6*  MCV 70.9*  --  69.5* 69.2*  PLT 343  --  294 316   Basic Metabolic Panel: Recent Labs  Lab 12/21/23 1907 12/21/23 1913 12/21/23 2224 12/22/23 0508 12/23/23 0252  NA 127* 129*  --  129* 135  K 4.5 4.7  --  4.5 4.1  CL 93*  --   --  95* 97*  CO2 22  --   --  21* 29  GLUCOSE 109*  --   --  157* 94  BUN 12  --   --  14 22  CREATININE 1.13*  --   --  1.02* 1.08*  CALCIUM  8.5*  --   --  8.4* 8.5*  MG  --   --  1.6*  --   --   PHOS  --   --   --  4.0  --    GFR: Estimated Creatinine Clearance: 44.2 mL/min (A) (by C-G formula based on SCr of 1.08 mg/dL (H)). Liver Function Tests: Recent Labs  Lab 12/21/23 1907  AST 48*  ALT 26  ALKPHOS 140*  BILITOT 1.2  PROT 8.6*  ALBUMIN 3.5   Recent Labs  Lab 12/21/23 1907  LIPASE 24   No results for input(s): AMMONIA in the last 168 hours. Coagulation Profile: No results for input(s): INR, PROTIME in the last 168 hours. Cardiac Enzymes: No results for input(s): CKTOTAL, CKMB, CKMBINDEX, TROPONINI in the last 168 hours. BNP (last 3 results) No results for input(s): PROBNP in the last 8760 hours. HbA1C: No results for input(s): HGBA1C in the last 72 hours. CBG: No results for input(s): GLUCAP in the last 168 hours. Lipid Profile: No results for input(s): CHOL, HDL, LDLCALC, TRIG, CHOLHDL, LDLDIRECT in the last 72 hours. Thyroid  Function Tests: No results for input(s): TSH, T4TOTAL, FREET4, T3FREE, THYROIDAB in the last 72 hours. Anemia Panel: No results for input(s): VITAMINB12,  FOLATE, FERRITIN, TIBC, IRON , RETICCTPCT in the last 72 hours. Urine analysis:    Component Value Date/Time   COLORURINE YELLOW (A) 04/20/2016 0710   APPEARANCEUR CLEAR (A) 04/20/2016 0710   APPEARANCEUR Clear 04/22/2013 0408   LABSPEC 1.004 (L) 04/20/2016 0710   LABSPEC 1.026 04/22/2013 0408   PHURINE 7.0 04/20/2016 0710   GLUCOSEU NEGATIVE 04/20/2016 0710   GLUCOSEU Negative 04/22/2013 0408   HGBUR MODERATE (A) 04/20/2016 0710   BILIRUBINUR NEGATIVE 04/20/2016 0710   BILIRUBINUR Negative 04/22/2013 0408   KETONESUR NEGATIVE 04/20/2016 0710   PROTEINUR NEGATIVE 04/20/2016 0710   NITRITE NEGATIVE 04/20/2016 0710   LEUKOCYTESUR TRACE (A) 04/20/2016 0710   LEUKOCYTESUR Negative 04/22/2013 0408   Sepsis Labs: @LABRCNTIP (procalcitonin:4,lacticidven:4)  ) Recent Results (from the past 240 hours)  Resp panel by RT-PCR (RSV, Flu A&B, Covid) Anterior Nasal Swab     Status: None   Collection Time: 12/21/23  7:06 PM   Specimen: Anterior Nasal Swab  Result Value Ref Range Status   SARS Coronavirus 2 by RT PCR NEGATIVE NEGATIVE Final   Influenza A by PCR NEGATIVE NEGATIVE Final   Influenza B by PCR NEGATIVE NEGATIVE Final    Comment: (NOTE) The Xpert Xpress SARS-CoV-2/FLU/RSV plus assay is intended as an aid in the diagnosis of influenza from Nasopharyngeal swab specimens and should not be used as a sole basis for treatment. Nasal washings and aspirates are unacceptable for Xpert Xpress SARS-CoV-2/FLU/RSV testing.  Fact Sheet for Patients: BloggerCourse.com  Fact Sheet for Healthcare Providers: SeriousBroker.it  This test is not yet approved or cleared by the [place]  FDA and has been authorized for detection and/or diagnosis of SARS-CoV-2 by FDA under an Emergency Use Authorization (EUA). This EUA will remain in effect (meaning this test can be used) for the duration of the COVID-19 declaration under Section  564(b)(1) of the Act, 21 U.S.C. section 360bbb-3(b)(1), unless the authorization is terminated or revoked.     Resp Syncytial Virus by PCR NEGATIVE NEGATIVE Final    Comment: (NOTE) Fact Sheet for Patients: BloggerCourse.com  Fact Sheet for Healthcare Providers: SeriousBroker.it  This test is not yet approved or cleared by the [place]  FDA and has been authorized for detection and/or diagnosis of SARS-CoV-2 by FDA under an Emergency Use Authorization (EUA). This EUA will remain in effect (meaning this test can be used) for the duration of the COVID-19 declaration under Section 564(b)(1) of the Act, 21 U.S.C. section 360bbb-3(b)(1), unless the authorization is terminated or revoked.  Performed at [facility], [address] [place], [place] [address]      Radiology Studies: ECHOCARDIOGRAM COMPLETE Result Date: 12/22/2023    ECHOCARDIOGRAM REPORT   Patient  Name:   Kathryn Banks Date of Exam: 12/22/2023 Medical Rec #:  [id number]     Height:       64.0 in Accession #:    [id number]    Weight:       135.0 lb Date of Birth:  1955/11/01      BSA:          1.655 m Patient Age:    68 years      BP:           120/66 mmHg Patient Gender: F             HR:           82 bpm. Exam Location:  Inpatient Procedure: 2D Echo, Cardiac Doppler and Color Doppler (Both Spectral and Color            Flow Doppler were utilized during procedure). Indications:    CHF  History:        Patient has prior history of Echocardiogram examinations, most                 recent 08/31/2023. CHF, COPD, Signs/Symptoms:Chest Pain and                 Shortness of Breath; Risk Factors:Hypertension, Current Smoker                 and Dyslipidemia.  Sonographer:    Juliene Rucks Referring Phys: [id number] CAROLE N HALL IMPRESSIONS  1. Left ventricular ejection fraction, by estimation, is 65 to 70%. The left ventricle has normal function. The left ventricle has no regional wall  motion abnormalities. Left ventricular diastolic parameters were normal.  2. Right ventricular systolic function is normal. The right ventricular size is mildly enlarged.  3. Left atrial size was moderately dilated.  4. The mitral valve is normal in structure. No evidence of mitral valve regurgitation.  5. The aortic valve is tricuspid. Aortic valve regurgitation is not visualized. No aortic stenosis is present.  6. The inferior vena cava is normal in size with greater than 50% respiratory variability, suggesting right atrial pressure of 3 mmHg. Comparison(s): No significant change from prior study. Prior images reviewed side by side. FINDINGS  Left Ventricle: Left ventricular ejection fraction, by estimation, is 65 to 70%. The left ventricle has normal function. The left ventricle has no regional wall motion abnormalities. The left ventricular internal cavity size was small. There is no left ventricular hypertrophy. Left ventricular diastolic parameters were normal. Right Ventricle: The right ventricular size is mildly enlarged. No increase in right ventricular wall thickness. Right ventricular systolic function is normal. Left Atrium: Left atrial size was moderately dilated. Right Atrium: Right atrial size was normal in size. Pericardium: Trivial pericardial effusion is present. The pericardial effusion is posterior to the left ventricle. Mitral Valve: The mitral valve is normal in structure. No evidence of mitral valve regurgitation. Tricuspid Valve: The tricuspid valve is normal in structure. Tricuspid valve regurgitation is not demonstrated. No evidence of tricuspid stenosis. Aortic Valve: The aortic valve is tricuspid. Aortic valve regurgitation is not visualized. No aortic stenosis is present. Pulmonic Valve: The pulmonic valve was normal in structure. Pulmonic valve regurgitation is not visualized. No evidence of pulmonic stenosis. Aorta: The aortic root and ascending aorta are structurally normal, with no  evidence of dilitation. Venous: The inferior vena cava is normal in size with greater than 50% respiratory variability, suggesting right atrial pressure of 3 mmHg. IAS/Shunts: The atrial septum is grossly  normal.   LV Volumes (MOD) LV vol d, MOD A2C: 154.0 ml LV vol d, MOD A4C: 102.0 ml LV vol s, MOD A2C: 56.4 ml LV vol s, MOD A4C: 31.0 ml LV SV MOD A2C:     97.6 ml LV SV MOD A4C:     102.0 ml LV SV MOD BP:      84.2 ml RIGHT VENTRICLE RV Basal diam:  4.18 cm RV Mid diam:    3.58 cm LEFT ATRIUM             Index        RIGHT ATRIUM           Index LA Vol (A2C):   75.2 ml 45.43 ml/m  RA Area:     19.50 cm LA Vol (A4C):   60.7 ml 36.67 ml/m  RA Volume:   53.40 ml  32.26 ml/m LA Biplane Vol: 71.0 ml 42.89 ml/m  TRICUSPID VALVE TR Peak grad:   25.4 mmHg TR Vmax:        252.00 cm/s Stanly Leavens MD Electronically signed by Stanly Leavens MD Signature Date/Time: 12/22/2023/4:45:54 PM    Final    DG Chest Port 1 View Result Date: 12/21/2023 CLINICAL DATA:  Dyspnea.  History of COPD. EXAM: PORTABLE CHEST 1 VIEW COMPARISON:  09/01/2023 FINDINGS: Cardiac enlargement. Diffuse interstitial pattern to the lungs similar to prior study and likely representing diffuse pulmonary fibrosis. Peribronchial thickening suggesting chronic bronchitis. Suggestion of increased airspace disease developing in the right lower lung since the previous study which could represent superimposed pneumonia or alveolitis. No pleural effusion or pneumothorax. Mediastinal contours appear intact. Calcification of the aorta. Old rib fractures. IMPRESSION: 1. Cardiac enlargement. 2. Chronic interstitial process in the lungs similar to prior study, likely diffuse pulmonary fibrosis. Chronic bronchitic changes. 3. Possible superimposed alveolar infiltrates in the left lower lung. Electronically Signed   By: Elsie Gravely M.D.   On: 12/21/2023 19:44     Scheduled Meds:  ALPRAZolam   1 mg Oral QHS    budesonide -glycopyrrolate -formoterol   2 puff Inhalation BID   enoxaparin  (LOVENOX ) injection  40 mg Subcutaneous Q24H   furosemide   40 mg Intravenous BID   ipratropium-albuterol   3 mL Nebulization Q4H   metoprolol  tartrate  12.5 mg Oral BID   nicotine   14 mg Transdermal Daily   pantoprazole   40 mg Oral Daily   predniSONE   40 mg Oral QAC breakfast   sertraline   25 mg Oral QHS   Continuous Infusions:  azithromycin  500 mg (12/22/23 2117)     LOS: 2 days    Time spent:    Sigurd Pac, MD [facility]   12/23/2023, 12:59 PM

## 2023-12-24 DIAGNOSIS — J9621 Acute and chronic respiratory failure with hypoxia: Secondary | ICD-10-CM | POA: Diagnosis not present

## 2023-12-24 LAB — BASIC METABOLIC PANEL WITH GFR
Anion gap: 10 (ref 5–15)
BUN: 24 mg/dL — ABNORMAL HIGH (ref 8–23)
CO2: 29 mmol/L (ref 22–32)
Calcium: 9.1 mg/dL (ref 8.9–10.3)
Chloride: 94 mmol/L — ABNORMAL LOW (ref 98–111)
Creatinine, Ser: 1.06 mg/dL — ABNORMAL HIGH (ref 0.44–1.00)
GFR, Estimated: 58 mL/min — ABNORMAL LOW (ref >60)
Glucose, Bld: 107 mg/dL — ABNORMAL HIGH (ref 70–99)
Potassium: 4.6 mmol/L (ref 3.5–5.1)
Sodium: 133 mmol/L — ABNORMAL LOW (ref 135–145)

## 2023-12-24 LAB — CBC
HCT: 30.3 % — ABNORMAL LOW (ref 36.0–46.0)
Hemoglobin: 8.5 g/dL — ABNORMAL LOW (ref 12.0–15.0)
MCH: 19.4 pg — ABNORMAL LOW (ref 26.0–34.0)
MCHC: 28.1 g/dL — ABNORMAL LOW (ref 30.0–36.0)
MCV: 69.2 fL — ABNORMAL LOW (ref 80.0–100.0)
Platelets: 333 K/uL (ref 150–400)
RBC: 4.38 MIL/uL (ref 3.87–5.11)
RDW: 19.5 % — ABNORMAL HIGH (ref 11.5–15.5)
WBC: 6.7 K/uL (ref 4.0–10.5)
nRBC: 0 % (ref 0.0–0.2)

## 2023-12-24 MED ORDER — BACLOFEN 10 MG PO TABS
5.0000 mg | ORAL_TABLET | Freq: Three times a day (TID) | ORAL | Status: DC | PRN
  Administered 2023-12-24: 5 mg via ORAL
  Filled 2023-12-24: qty 1

## 2023-12-24 MED ORDER — FUROSEMIDE 10 MG/ML IJ SOLN
20.0000 mg | Freq: Two times a day (BID) | INTRAMUSCULAR | Status: DC
  Administered 2023-12-24 – 2023-12-25 (×3): 20 mg via INTRAVENOUS
  Filled 2023-12-24 (×3): qty 2

## 2023-12-24 MED ORDER — POLYSACCHARIDE IRON COMPLEX 150 MG PO CAPS
150.0000 mg | ORAL_CAPSULE | Freq: Every day | ORAL | Status: DC
Start: 2023-12-25 — End: 2023-12-25
  Administered 2023-12-25: 150 mg via ORAL
  Filled 2023-12-24: qty 1

## 2023-12-24 MED ORDER — IRON SUCROSE 500 MG IVPB - SIMPLE MED
500.0000 mg | Freq: Once | INTRAVENOUS | Status: DC
  Filled 2023-12-24: qty 275

## 2023-12-24 MED ORDER — SODIUM CHLORIDE 0.9 % IV SOLN
500.0000 mg | Freq: Once | INTRAVENOUS | Status: AC
  Administered 2023-12-24: 500 mg via INTRAVENOUS
  Filled 2023-12-24: qty 25

## 2023-12-24 MED ORDER — AZITHROMYCIN 250 MG PO TABS
500.0000 mg | ORAL_TABLET | Freq: Every day | ORAL | Status: DC
  Administered 2023-12-24: 500 mg via ORAL
  Filled 2023-12-24: qty 2

## 2023-12-24 NOTE — TOC Initial Note (Signed)
 Transition of Care [facility]) - Initial/Assessment Note    Patient Details  Name: Kathryn Banks MRN: [id number] Date of Birth: 1955-06-06  Transition [facility]) CM/SW Contact:    Waddell Barnie Rama, RN Phone Number: 12/24/2023, 1:03 PM  Clinical Narrative:                 From home with boyfriend, has PCP and insurance on file, states has no HH services in place at this time , has home oxygen  4 liters at home.  States boyfriend will transport them home at [employer] and he is support system, states gets medications from [facility] on [address] in [place].  Pta self ambulatory.   There are no ICM needs identified  at this time.  Please place consult for ICM needs.    Expected Discharge Plan: Home/Self Care Barriers to Discharge: Continued Medical Work up   Patient Goals and CMS Choice Patient states their goals for this hospitalization and ongoing recovery are:: return home   Choice offered to / list presented to : NA      Expected Discharge Plan and Services In-house Referral: NA Discharge Planning Services: CM Consult Post Acute Care Choice: NA Living arrangements for the past 2 months: Single Family Home                 DME Arranged: N/A DME Agency: NA       HH Arranged: NA          Prior Living Arrangements/Services Living arrangements for the past 2 months: Single Family Home Lives with:: Significant Other Patient language and need for interpreter reviewed:: Yes Do you feel safe going back to the place where you live?: Yes      Need for Family Participation in Patient Care: No (Comment) Care giver support system in place?: Yes (comment) Current home services: DME (home oxygen  4 liters) Criminal Activity/Legal Involvement Pertinent to Current Situation/Hospitalization: No - Comment as needed  Activities of Daily Living      Permission Sought/Granted Permission sought to share information with : Case Manager Permission granted to share information with : Yes,  Verbal Permission Granted     Permission granted to share info w AGENCY: DME oxygen         Emotional Assessment Appearance:: Appears stated age Attitude/Demeanor/Rapport: Engaged Affect (typically observed): Appropriate Orientation: : Oriented to Self, Oriented to Place, Oriented to  Time, Oriented to Situation Alcohol / Substance Use: Not Applicable Psych Involvement: No (comment)  Admission diagnosis:  COPD exacerbation (HCC) [J44.1] Congestive heart failure, unspecified HF chronicity, unspecified heart failure type (HCC) [I50.9] Acute on chronic hypoxic respiratory failure (HCC) [J96.21] Patient Active Problem List   Diagnosis Date Noted   Acute on chronic hypoxic respiratory failure (HCC) 12/21/2023   Abnormal SPEP 01/14/2023   Thrombocytosis 01/14/2023   Tobacco abuse 12/05/2022   Acute pulmonary edema (HCC) 12/03/2022   Acute exacerbation of chronic obstructive pulmonary disease (COPD) (HCC) 12/03/2022   Acute on chronic respiratory failure with hypoxia (HCC) 12/03/2022   Acute diastolic CHF (congestive heart failure) (HCC) 12/02/2022   Community acquired pneumonia of right lower lobe of lung 12/02/2022   Acute hypoxic respiratory failure (HCC) 11/28/2022   Right lower lobe pneumonia 11/28/2022   Continuous dependence on cigarette smoking 11/28/2022   History of depression 11/28/2022   Cirrhosis (HCC) 11/28/2022   GAD (generalized anxiety disorder) 11/28/2022   Microcytic anemia 11/28/2022   Hyperlipidemia 11/28/2022   Elevated brain natriuretic peptide (BNP) level 11/28/2022   Subclinical  hypothyroidism 02/01/2019   Chest pain 01/31/2019   Essential hypertension 01/31/2019   COPD with acute exacerbation (HCC) 01/31/2019   Tobacco dependence 01/31/2019   History of substance abuse (HCC) 01/31/2019   Pulmonary nodules/lesions, multiple 01/31/2019   PCP:  Zachary Idelia LABOR, MD Pharmacy:   [facility] [address], [place] - [address] [place] [address] Phone: [phone] Fax: [phone]  [facility] [address] [place] [address] Phone: [phone] Fax: [phone]     Social Drivers of Health (SDOH) Social History: SDOH Screenings   Food Insecurity: No Food Insecurity (12/23/2023)  Housing: Low Risk  (12/23/2023)  Transportation Needs: No Transportation Needs (12/23/2023)  Utilities: Not At Risk (12/23/2023)  Financial Resource Strain: Medium Risk (01/23/2023)   Received from [facility]  Social Connections: Socially Integrated (12/23/2023)  Tobacco Use: High Risk (09/23/2023)   SDOH Interventions:     Readmission Risk Interventions    12/24/2023    1:00 PM 09/01/2023    4:22 PM  Readmission Risk Prevention Plan  Transportation Screening Complete Complete  PCP or Specialist Appt within 5-7 Days  Complete  Home Care Screening  Complete  Medication Review (RN CM)  Complete  HRI or Home Care Consult Complete   Palliative Care Screening Not Applicable   Medication Review (RN Care Manager) Complete

## 2023-12-24 NOTE — Progress Notes (Signed)
 Mobility Specialist Progress Note:    12/24/23 1020  Mobility  Activity Ambulated with assistance  Level of Assistance Standby assist, set-up cues, supervision of patient - no hands on  Assistive Device None  Distance Ambulated (ft) 100 ft  Activity Response Tolerated fair  Mobility Referral Yes  Mobility visit 1 Mobility  Mobility Specialist Start Time (ACUTE ONLY) 1020  Mobility Specialist Stop Time (ACUTE ONLY) 1040  Mobility Specialist Time Calculation (min) (ACUTE ONLY) 20 min   Pt pleasant and agreeable to session. No c/o any symptoms. Pt requiring 6L HFNC to ambulate and vitals to stay 91%-93% O2. Pt moving well. Returned pt to room w/ all needs met. Left w/ NT and RN  Venetia Keel Mobility Specialist Please Contact via SecureChat or Rehab Office at [phone]

## 2023-12-24 NOTE — Progress Notes (Signed)
 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  In the ER hypoxic requiring 8 L of O2, mildly volume overloaded, BNP 1200, chest x-ray with chronic interstitial findings,?  Pulmonary fibrosis   Subjective: -Some dyspnea, a little better from yesterday  Assessment and Plan:  Acute on chronic hypoxic respiratory failure COPD exacerbation -Flu COVID, RSV PCR negative - Proving, will switch to oral prednisone  today, continue DuoNebs, azithromycin , flutter valve - Wean O2 as tolerated, increase activity  Acute on chronic diastolic CHF - Last echo 9/25 with EF 65-70%, normal RV - Improving with diuresis, cut down Lasix  to 20 mg twice daily today - Add Aldactone  tomorrow if potassium is acceptable  Pulmonary fibrosis - Follow-up with Dr. Kara  Tobacco use disorder Counseled  Hypomagnesemia Repleted  Iron  deficiency anemia - Anemia panel with severe iron  deficiency, add IV iron     DVT prophylaxis: Lovenox  Code Status: Full code Family Communication: Boyfriend at bedside Disposition Plan: Home likely 48 hours  Consultants:    Procedures:   Antimicrobials:    Objective: Vitals:   12/24/23 0344 12/24/23 0453 12/24/23 0737 12/24/23 0826  BP: 124/76  (!) 145/93   Pulse: 72  78 68  Resp: 20  20 18   Temp: 97.8 F (36.6 C)  98.8 F (37.1 C)   TempSrc: Oral  Oral   SpO2: 94%  99%   Weight:  58.8 kg    Height:        Intake/Output Summary (Last 24 hours) at 12/24/2023 1218 Last data filed at 12/24/2023 0351 Gross per 24 hour  Intake 744 ml  Output 1500 ml  Net -756 ml   Filed Weights   12/21/23 1858 12/23/23 0436 12/24/23 0453  Weight: 61.2 kg 60.9 kg 58.8 kg    Examination:  General exam: Appears calm and comfortable  chronically ill-appearing Respiratory system: Poor air movement, scattered rhonchi and few expiratory wheezes Cardiovascular system: S1 & S2 heard, RRR.  Abd: nondistended, soft and nontender.Normal bowel sounds heard. Central nervous system: Alert and oriented. No focal neurological deficits. Extremities: Trace edema Skin: No rashes Psychiatry:  Mood & affect appropriate.     Data Reviewed:   CBC: Recent Labs  Lab 12/21/23 1907 12/21/23 1913 12/22/23 0508 12/23/23 0252 12/24/23 0201  WBC 11.4*  --  6.7 6.4 6.7  HGB 8.4* 10.9* 8.4* 8.0* 8.5*  HCT 30.4* 32.0* 29.8* 28.6* 30.3*  MCV 70.9*  --  69.5* 69.2* 69.2*  PLT 343  --  294 316 333   Basic Metabolic Panel: Recent Labs  Lab 12/21/23 1907 12/21/23 1913 12/21/23 2224 12/22/23 0508 12/23/23 0252 12/23/23 1402 12/24/23 0201  NA 127* 129*  --  129* 135  --  133*  K 4.5 4.7  --  4.5 4.1  --  4.6  CL 93*  --   --  95* 97*  --  94*  CO2 22  --   --  21* 29  --  29  GLUCOSE 109*  --   --  157* 94  --  107*  BUN 12  --   --  14 22  --  24*  CREATININE 1.13*  --   --  1.02* 1.08*  --  1.06*  CALCIUM  8.5*  --   --  8.4* 8.5*  --  9.1  MG  --   --  1.6*  --   --  2.0  --   PHOS  --   --   --  4.0  --   --   --    GFR: Estimated Creatinine Clearance: 45.1 mL/min (A) (by C-G formula based on SCr of 1.06 mg/dL (H)). Liver Function Tests: Recent Labs  Lab 12/21/23 1907  AST 48*  ALT 26  ALKPHOS 140*  BILITOT 1.2  PROT 8.6*  ALBUMIN 3.5   Recent Labs  Lab 12/21/23 1907  LIPASE 24   No results for input(s): AMMONIA in the last 168 hours. Coagulation Profile: No results for input(s): INR, PROTIME in the last 168 hours. Cardiac Enzymes: No results for input(s): CKTOTAL, CKMB, CKMBINDEX, TROPONINI in the last 168 hours. BNP (last 3 results) No results for input(s): PROBNP in the last 8760 hours. HbA1C: No results for input(s): HGBA1C in the last 72 hours. CBG: No results for input(s): GLUCAP  in the last 168 hours. Lipid Profile: No results for input(s): CHOL, HDL, LDLCALC, TRIG, CHOLHDL, LDLDIRECT in the last 72 hours. Thyroid  Function Tests: No results for input(s): TSH, T4TOTAL, FREET4, T3FREE, THYROIDAB in the last 72 hours. Anemia Panel: Recent Labs    12/23/23 1402  VITAMINB12 3,862*  FOLATE 5.4*  FERRITIN 8*  TIBC 602*  IRON  14*  RETICCTPCT 1.6   Urine analysis:    Component Value Date/Time   COLORURINE YELLOW (A) 04/20/2016 0710   APPEARANCEUR CLEAR (A) 04/20/2016 0710   APPEARANCEUR Clear 04/22/2013 0408   LABSPEC 1.004 (L) 04/20/2016 0710   LABSPEC 1.026 04/22/2013 0408   PHURINE 7.0 04/20/2016 0710   GLUCOSEU NEGATIVE 04/20/2016 0710   GLUCOSEU Negative 04/22/2013 0408   HGBUR MODERATE (A) 04/20/2016 0710   BILIRUBINUR NEGATIVE 04/20/2016 0710   BILIRUBINUR Negative 04/22/2013 0408   KETONESUR NEGATIVE 04/20/2016 0710   PROTEINUR NEGATIVE 04/20/2016 0710   NITRITE NEGATIVE 04/20/2016 0710   LEUKOCYTESUR TRACE (A) 04/20/2016 0710   LEUKOCYTESUR Negative 04/22/2013 0408   Sepsis Labs: @LABRCNTIP (procalcitonin:4,lacticidven:4)  ) Recent Results (from the past 240 hours)  Resp panel by RT-PCR (RSV, Flu A&B, Covid) Anterior Nasal Swab     Status: None   Collection Time: 12/21/23  7:06 PM   Specimen: Anterior Nasal Swab  Result Value Ref Range Status   SARS Coronavirus 2 by RT PCR NEGATIVE NEGATIVE Final   Influenza A by PCR NEGATIVE NEGATIVE Final   Influenza B by PCR NEGATIVE NEGATIVE Final    Comment: (NOTE) The Xpert Xpress SARS-CoV-2/FLU/RSV plus assay is intended as an aid in the diagnosis of influenza from Nasopharyngeal swab specimens and should not be used as a sole basis for treatment. Nasal washings and aspirates are unacceptable for Xpert Xpress SARS-CoV-2/FLU/RSV testing.  Fact Sheet for Patients: BloggerCourse.com  Fact Sheet for Healthcare  Providers: SeriousBroker.it  This test is not yet approved or cleared by the [place]  FDA and has been authorized for detection and/or diagnosis of SARS-CoV-2 by FDA under an Emergency Use Authorization (EUA). This EUA will remain in effect (meaning this test can be used) for the duration of the COVID-19 declaration under Section 564(b)(1) of the Act, 21 U.S.C. section 360bbb-3(b)(1), unless the authorization is terminated or revoked.     Resp Syncytial Virus by PCR NEGATIVE NEGATIVE Final    Comment: (NOTE) Fact Sheet for Patients: BloggerCourse.com  Fact Sheet for Healthcare Providers: SeriousBroker.it  This test is not yet approved or cleared  by the [place]  FDA and has been authorized for detection and/or diagnosis of SARS-CoV-2 by FDA under an Emergency Use Authorization (EUA). This EUA will remain in effect (meaning this test can be used) for the duration of the COVID-19 declaration under Section 564(b)(1) of the Act, 21 U.S.C. section 360bbb-3(b)(1), unless the authorization is terminated or revoked.  Performed at [facility], [address], [place], [place] [address]      Radiology Studies: ECHOCARDIOGRAM COMPLETE Result Date: 12/22/2023    ECHOCARDIOGRAM REPORT   Patient Name:   Kathryn Banks Date of Exam: 12/22/2023 Medical Rec #:  [id number]     Height:       64.0 in Accession #:    [id number]    Weight:       135.0 lb Date of Birth:  [birth year]      BSA:          1.655 m Patient Age:    68 years      BP:           120/66 mmHg Patient Gender: F             HR:           82 bpm. Exam Location:  Inpatient Procedure: 2D Echo, Cardiac Doppler and Color Doppler (Both Spectral and Color            Flow Doppler were utilized during procedure). Indications:    CHF  History:        Patient has prior history of Echocardiogram examinations, most                 recent 08/31/2023. CHF, COPD,  Signs/Symptoms:Chest Pain and                 Shortness of Breath; Risk Factors:Hypertension, Current Smoker                 and Dyslipidemia.  Sonographer:    Juliene Rucks Referring Phys: [id number] CAROLE N HALL IMPRESSIONS  1. Left ventricular ejection fraction, by estimation, is 65 to 70%. The left ventricle has normal function. The left ventricle has no regional wall motion abnormalities. Left ventricular diastolic parameters were normal.  2. Right ventricular systolic function is normal. The right ventricular size is mildly enlarged.  3. Left atrial size was moderately dilated.  4. The mitral valve is normal in structure. No evidence of mitral valve regurgitation.  5. The aortic valve is tricuspid. Aortic valve regurgitation is not visualized. No aortic stenosis is present.  6. The inferior vena cava is normal in size with greater than 50% respiratory variability, suggesting right atrial pressure of 3 mmHg. Comparison(s): No significant change from prior study. Prior images reviewed side by side. FINDINGS  Left Ventricle: Left ventricular ejection fraction, by estimation, is 65 to 70%. The left ventricle has normal function. The left ventricle has no regional wall motion abnormalities. The left ventricular internal cavity size was small. There is no left ventricular hypertrophy. Left ventricular diastolic parameters were normal. Right Ventricle: The right ventricular size is mildly enlarged. No increase in right ventricular wall thickness. Right ventricular systolic function is normal. Left Atrium: Left atrial size was moderately dilated. Right Atrium: Right atrial size was normal in size. Pericardium: Trivial pericardial effusion is present. The pericardial effusion is posterior to the left ventricle. Mitral Valve: The mitral valve is normal in structure. No evidence of mitral valve regurgitation. Tricuspid Valve: The tricuspid valve is normal in structure.  Tricuspid valve regurgitation is not demonstrated. No  evidence of tricuspid stenosis. Aortic Valve: The aortic valve is tricuspid. Aortic valve regurgitation is not visualized. No aortic stenosis is present. Pulmonic Valve: The pulmonic valve was normal in structure. Pulmonic valve regurgitation is not visualized. No evidence of pulmonic stenosis. Aorta: The aortic root and ascending aorta are structurally normal, with no evidence of dilitation. Venous: The inferior vena cava is normal in size with greater than 50% respiratory variability, suggesting right atrial pressure of 3 mmHg. IAS/Shunts: The atrial septum is grossly normal.   LV Volumes (MOD) LV vol d, MOD A2C: 154.0 ml LV vol d, MOD A4C: 102.0 ml LV vol s, MOD A2C: 56.4 ml LV vol s, MOD A4C: 31.0 ml LV SV MOD A2C:     97.6 ml LV SV MOD A4C:     102.0 ml LV SV MOD BP:      84.2 ml RIGHT VENTRICLE RV Basal diam:  4.18 cm RV Mid diam:    3.58 cm LEFT ATRIUM             Index        RIGHT ATRIUM           Index LA Vol (A2C):   75.2 ml 45.43 ml/m  RA Area:     19.50 cm LA Vol (A4C):   60.7 ml 36.67 ml/m  RA Volume:   53.40 ml  32.26 ml/m LA Biplane Vol: 71.0 ml 42.89 ml/m  TRICUSPID VALVE TR Peak grad:   25.4 mmHg TR Vmax:        252.00 cm/s Stanly Leavens MD Electronically signed by Stanly Leavens MD Signature Date/Time: 12/22/2023/4:45:54 PM    Final      Scheduled Meds:  ALPRAZolam   1 mg Oral QHS   budesonide -glycopyrrolate -formoterol   2 puff Inhalation BID   enoxaparin  (LOVENOX ) injection  40 mg Subcutaneous Q24H   furosemide   20 mg Intravenous BID   guaiFENesin   600 mg Oral BID   ipratropium-albuterol   3 mL Nebulization BID   metoprolol  tartrate  12.5 mg Oral BID   nicotine   14 mg Transdermal Daily   pantoprazole   40 mg Oral Daily   predniSONE   40 mg Oral QAC breakfast   sertraline   25 mg Oral QHS   Continuous Infusions:  azithromycin  500 mg (12/23/23 2123)   iron  sucrose 500 mg (12/24/23 1210)     LOS: 3 days    Time spent:    Sigurd Pac, MD [facility]   12/24/2023, 12:18 PM

## 2023-12-24 NOTE — Progress Notes (Signed)
 Mobility Specialist Progress Note:    12/24/23 1335  Mobility  Activity Ambulated with assistance (To bathroom then to bed)  Level of Assistance Standby assist, set-up cues, supervision of patient - no hands on  Assistive Device None;Other (Comment) (IV Pole)  Distance Ambulated (ft) 25 ft  Activity Response Tolerated well  Mobility Referral Yes  Mobility visit 1 Mobility  Mobility Specialist Start Time (ACUTE ONLY) 1335  Mobility Specialist Stop Time (ACUTE ONLY) 1348  Mobility Specialist Time Calculation (min) (ACUTE ONLY) 13 min   Pt calling out requesting for assistance back to bed. During ambulating, pt requesting to use room bathroom. Pt able to move and ambulate well through out room on 4L [place]. Pt returned to bed w/ all needs met.   Venetia Keel Mobility Specialist Please Neurosurgeon or Rehab Office at [phone]

## 2023-12-24 NOTE — Plan of Care (Signed)
   Problem: Education: Goal: Knowledge of General Education information will improve Description: Including pain rating scale, medication(s)/side effects and non-pharmacologic comfort measures Outcome: Progressing   Problem: Clinical Measurements: Goal: Will remain free from infection Outcome: Progressing   Problem: Clinical Measurements: Goal: Respiratory complications will improve Outcome: Progressing

## 2023-12-25 ENCOUNTER — Other Ambulatory Visit (HOSPITAL_COMMUNITY): Payer: Self-pay

## 2023-12-25 DIAGNOSIS — J9621 Acute and chronic respiratory failure with hypoxia: Secondary | ICD-10-CM | POA: Diagnosis not present

## 2023-12-25 LAB — CBC
HCT: 29.5 % — ABNORMAL LOW (ref 36.0–46.0)
Hemoglobin: 8.3 g/dL — ABNORMAL LOW (ref 12.0–15.0)
MCH: 19.6 pg — ABNORMAL LOW (ref 26.0–34.0)
MCHC: 28.1 g/dL — ABNORMAL LOW (ref 30.0–36.0)
MCV: 69.7 fL — ABNORMAL LOW (ref 80.0–100.0)
Platelets: 340 K/uL (ref 150–400)
RBC: 4.23 MIL/uL (ref 3.87–5.11)
RDW: 19.8 % — ABNORMAL HIGH (ref 11.5–15.5)
WBC: 5.1 K/uL (ref 4.0–10.5)
nRBC: 0 % (ref 0.0–0.2)

## 2023-12-25 LAB — BASIC METABOLIC PANEL WITH GFR
Anion gap: 9 (ref 5–15)
BUN: 26 mg/dL — ABNORMAL HIGH (ref 8–23)
CO2: 29 mmol/L (ref 22–32)
Calcium: 9 mg/dL (ref 8.9–10.3)
Chloride: 95 mmol/L — ABNORMAL LOW (ref 98–111)
Creatinine, Ser: 1.12 mg/dL — ABNORMAL HIGH (ref 0.44–1.00)
GFR, Estimated: 54 mL/min — ABNORMAL LOW (ref >60)
Glucose, Bld: 111 mg/dL — ABNORMAL HIGH (ref 70–99)
Potassium: 5.1 mmol/L (ref 3.5–5.1)
Sodium: 133 mmol/L — ABNORMAL LOW (ref 135–145)

## 2023-12-25 MED ORDER — SPIRONOLACTONE 25 MG PO TABS
25.0000 mg | ORAL_TABLET | Freq: Every day | ORAL | 0 refills | Status: AC
  Filled 2023-12-25: qty 30, 30d supply, fill #0

## 2023-12-25 MED ORDER — LACTULOSE 10 GM/15ML PO SOLN
20.0000 g | Freq: Two times a day (BID) | ORAL | 0 refills | Status: AC
  Filled 2023-12-25: qty 237, 4d supply, fill #0

## 2023-12-25 MED ORDER — FUROSEMIDE 40 MG PO TABS: 40.0000 mg | ORAL_TABLET | Freq: Every day | ORAL | Status: DC

## 2023-12-25 MED ORDER — POLYSACCHARIDE IRON COMPLEX 150 MG PO CAPS
150.0000 mg | ORAL_CAPSULE | Freq: Every day | ORAL | 0 refills | Status: AC
  Filled 2023-12-25: qty 30, 30d supply, fill #0

## 2023-12-25 MED ORDER — PREDNISONE 20 MG PO TABS
20.0000 mg | ORAL_TABLET | Freq: Every day | ORAL | 0 refills | Status: AC
  Filled 2023-12-25: qty 3, 3d supply, fill #0

## 2023-12-25 MED ORDER — LACTULOSE 10 GM/15ML PO SOLN: 20.0000 g | Freq: Two times a day (BID) | ORAL | Status: DC

## 2023-12-25 NOTE — Progress Notes (Signed)
 Reviewed AVS, patient expressed understanding of medications, MD follow up reviewed.   Removed IV, Site clean, dry and intact.  CCMD contacted and informed patients is being discharged.  Patient states all belongings brought to the hospital at time of admission are accounted for and packed to take home.  Picked up medications from [facility] pharmacy. Lead Transport contacted to transport patient to Discharge lounge to wait for transportation home.

## 2023-12-25 NOTE — Discharge Summary (Signed)
 Physician Discharge Summary  [name]:[id number] DOB: 1955/08/01 DOA: 12/21/2023  PCP: Zachary Idelia LABOR, MD  Admit date: 12/21/2023 Discharge date: 12/25/2023  Time spent: 45 minutes  Recommendations for Outpatient Follow-up:  Cards Dr.Custovic in [place] PCP in 1 week, please check BMP at follow-up   Discharge Diagnoses:  Principal Problem:   Acute on chronic hypoxic respiratory failure (HCC) COPD exacerbation Acute on chronic diastolic CHF Pulmonary fibrosis Tobacco use disorder Hypomagnesemia Iron  deficiency anemia  Discharge Condition: Improved  Diet recommendation: Low-sodium, heart healthy  Filed Weights   12/23/23 0436 12/24/23 0453 12/25/23 0512  Weight: 60.9 kg 58.8 kg 59.7 kg    History of present illness:  68/F with advanced COPD with pulmonary fibrosis on 4 L O2, current tobacco use, hep C, cirrhosis, anxiety, GERD presented to the ED with worsening dyspnea, cough and congestion. In the ER hypoxic requiring 8 L of O2, mildly volume overloaded, BNP 1200, chest x-ray with chronic interstitial findings,? Pulmonary fibrosis    Hospital Course:   Acute on chronic hypoxic respiratory failure COPD exacerbation -Flu COVID, RSV PCR negative - Improving, treated with IV steroids, antibiotics, nebs and pulmonary toilet,  - Weaned down to prednisone  for a quick taper -Smoking cessation counseled   Acute on chronic diastolic CHF - Last echo 9/25 with EF 65-70%, normal RV - Improving with diuresis, resumed oral Lasix  -Restarted Aldactone , check BMP in 1 week   Pulmonary fibrosis - Follow-up with Dr. Kara   Tobacco use disorder Counseled   Hypomagnesemia Repleted   Iron  deficiency anemia - Anemia panel with severe iron  deficiency, add IV iron   Discharge Exam: Vitals:   12/25/23 0831 12/25/23 0842  BP:  129/79  Pulse: 72 72  Resp: 20   Temp:    SpO2:     Gen: Awake, Alert, Oriented X 3,  HEENT: no JVD Lungs: Poor air movement  bilaterally CVS: S1S2/RRR Abd: soft, Non tender, non distended, BS present Extremities: No edema Skin: no new rashes on exposed skin   Discharge Instructions   Discharge Instructions     Diet - low sodium heart healthy   Complete by: As directed    Increase activity slowly   Complete by: As directed       Allergies as of 12/25/2023   No Known Allergies      Medication List     STOP taking these medications    benzonatate  100 MG capsule Commonly known as: TESSALON        TAKE these medications    albuterol  (2.5 MG/3ML) 0.083% nebulizer solution Commonly known as: PROVENTIL  Take 3 mLs (2.5 mg total) by nebulization every 2 (two) hours as needed for wheezing. What changed:  when to take this reasons to take this   ALPRAZolam  1 MG tablet Commonly known as: XANAX  Take 1 mg by mouth at bedtime.   amLODipine  5 MG tablet Commonly known as: NORVASC  Take 1 tablet (5 mg total) by mouth daily.   baclofen  20 MG tablet Commonly known as: LIORESAL  Take 20 mg by mouth every 6 (six) hours.   Breztri  Aerosphere 160-9-4.8 MCG/ACT Aero inhaler Generic drug: budesonide -glycopyrrolate -formoterol  Inhale 2 puffs into the lungs in the morning and at bedtime.   cyanocobalamin  1000 MCG tablet Commonly known as: VITAMIN B12 Take 1,000 mcg by mouth in the morning.   diphenhydramine -acetaminophen  25-500 MG Tabs tablet Commonly known as: TYLENOL  PM Take 2 tablets by mouth at bedtime.   furosemide  40 MG tablet Commonly known as: LASIX  Take 1 tablet (  40 mg total) by mouth daily.   iron  polysaccharides 150 MG capsule Commonly known as: NIFEREX Take 1 capsule (150 mg total) by mouth daily. Start taking on: December 26, 2023   lactulose  10 GM/15ML solution Commonly known as: CHRONULAC  Take 30 mLs (20 g total) by mouth 2 (two) times daily for 4 days.   MAGNESIUM  OXIDE 400 PO Take 1 tablet by mouth in the morning and at bedtime.   metoprolol  tartrate 25 MG tablet Commonly  known as: LOPRESSOR  Take 0.5 tablets (12.5 mg total) by mouth 2 (two) times daily.   nicotine  21 mg/24hr patch Commonly known as: NICODERM CQ  - dosed in mg/24 hours Place 21 mg onto the skin as needed.   omeprazole  20 MG tablet Commonly known as: PRILOSEC  OTC Take 20 mg by mouth daily.   ondansetron  4 MG tablet Commonly known as: ZOFRAN  Take 1 tablet (4 mg total) by mouth every 6 (six) hours as needed for nausea.   predniSONE  20 MG tablet Commonly known as: DELTASONE  Take 1 tablet (20 mg total) by mouth daily before breakfast for 3 days. Start taking on: December 26, 2023   senna-docusate 8.6-50 MG tablet Commonly known as: Senokot-S Take 1 tablet by mouth at bedtime. What changed: when to take this   sertraline  25 MG tablet Commonly known as: ZOLOFT  Take 1 tablet (25 mg total) by mouth at bedtime.   Spiriva  Respimat 2.5 MCG/ACT Aers Generic drug: Tiotropium Bromide  Monohydrate SMARTSIG:2 inhalation Via Inhaler Daily   spironolactone  25 MG tablet Commonly known as: ALDACTONE  Take 1 tablet (25 mg total) by mouth daily. Start taking on: December 27, 2023 What changed: These instructions start on December 27, 2023. If you are unsure what to do until then, ask your doctor or other care provider.       No Known Allergies  Follow-up Information     [facility]. Go in 1 week(s).   Why: [facility] on 12/29/23 at 3:30 PM Contact information: [address] [place], [place] [address] [phone]                 The results of significant diagnostics from this hospitalization (including imaging, microbiology, ancillary and laboratory) are listed below for reference.    Significant Diagnostic Studies: ECHOCARDIOGRAM COMPLETE Result Date: 12/22/2023    ECHOCARDIOGRAM REPORT   Patient Name:   Kathryn Banks Date of Exam: 12/22/2023 Medical Rec #:  [id number]     Height:       64.0 in Accession #:     [id number]    Weight:       135.0 lb Date of Birth:  30-Aug-1955      BSA:          1.655 m Patient Age:    68 years      BP:           120/66 mmHg Patient Gender: F             HR:           82 bpm. Exam Location:  Inpatient Procedure: 2D Echo, Cardiac Doppler and Color Doppler (Both Spectral and Color            Flow Doppler were utilized during procedure). Indications:    CHF  History:        Patient has prior history of Echocardiogram examinations, most  recent 08/31/2023. CHF, COPD, Signs/Symptoms:Chest Pain and                 Shortness of Breath; Risk Factors:Hypertension, Current Smoker                 and Dyslipidemia.  Sonographer:    Juliene Rucks Referring Phys: [id number] CAROLE N HALL IMPRESSIONS  1. Left ventricular ejection fraction, by estimation, is 65 to 70%. The left ventricle has normal function. The left ventricle has no regional wall motion abnormalities. Left ventricular diastolic parameters were normal.  2. Right ventricular systolic function is normal. The right ventricular size is mildly enlarged.  3. Left atrial size was moderately dilated.  4. The mitral valve is normal in structure. No evidence of mitral valve regurgitation.  5. The aortic valve is tricuspid. Aortic valve regurgitation is not visualized. No aortic stenosis is present.  6. The inferior vena cava is normal in size with greater than 50% respiratory variability, suggesting right atrial pressure of 3 mmHg. Comparison(s): No significant change from prior study. Prior images reviewed side by side. FINDINGS  Left Ventricle: Left ventricular ejection fraction, by estimation, is 65 to 70%. The left ventricle has normal function. The left ventricle has no regional wall motion abnormalities. The left ventricular internal cavity size was small. There is no left ventricular hypertrophy. Left ventricular diastolic parameters were normal. Right Ventricle: The right ventricular size is mildly enlarged. No increase in right  ventricular wall thickness. Right ventricular systolic function is normal. Left Atrium: Left atrial size was moderately dilated. Right Atrium: Right atrial size was normal in size. Pericardium: Trivial pericardial effusion is present. The pericardial effusion is posterior to the left ventricle. Mitral Valve: The mitral valve is normal in structure. No evidence of mitral valve regurgitation. Tricuspid Valve: The tricuspid valve is normal in structure. Tricuspid valve regurgitation is not demonstrated. No evidence of tricuspid stenosis. Aortic Valve: The aortic valve is tricuspid. Aortic valve regurgitation is not visualized. No aortic stenosis is present. Pulmonic Valve: The pulmonic valve was normal in structure. Pulmonic valve regurgitation is not visualized. No evidence of pulmonic stenosis. Aorta: The aortic root and ascending aorta are structurally normal, with no evidence of dilitation. Venous: The inferior vena cava is normal in size with greater than 50% respiratory variability, suggesting right atrial pressure of 3 mmHg. IAS/Shunts: The atrial septum is grossly normal.   LV Volumes (MOD) LV vol d, MOD A2C: 154.0 ml LV vol d, MOD A4C: 102.0 ml LV vol s, MOD A2C: 56.4 ml LV vol s, MOD A4C: 31.0 ml LV SV MOD A2C:     97.6 ml LV SV MOD A4C:     102.0 ml LV SV MOD BP:      84.2 ml RIGHT VENTRICLE RV Basal diam:  4.18 cm RV Mid diam:    3.58 cm LEFT ATRIUM             Index        RIGHT ATRIUM           Index LA Vol (A2C):   75.2 ml 45.43 ml/m  RA Area:     19.50 cm LA Vol (A4C):   60.7 ml 36.67 ml/m  RA Volume:   53.40 ml  32.26 ml/m LA Biplane Vol: 71.0 ml 42.89 ml/m  TRICUSPID VALVE TR Peak grad:   25.4 mmHg TR Vmax:        252.00 cm/s Stanly Leavens MD Electronically signed by Stanly Leavens MD Signature Date/Time: 12/22/2023/4:45:54  PM    Final    DG Chest Port 1 View Result Date: 12/21/2023 CLINICAL DATA:  Dyspnea.  History of COPD. EXAM: PORTABLE CHEST 1 VIEW COMPARISON:  09/01/2023  FINDINGS: Cardiac enlargement. Diffuse interstitial pattern to the lungs similar to prior study and likely representing diffuse pulmonary fibrosis. Peribronchial thickening suggesting chronic bronchitis. Suggestion of increased airspace disease developing in the right lower lung since the previous study which could represent superimposed pneumonia or alveolitis. No pleural effusion or pneumothorax. Mediastinal contours appear intact. Calcification of the aorta. Old rib fractures. IMPRESSION: 1. Cardiac enlargement. 2. Chronic interstitial process in the lungs similar to prior study, likely diffuse pulmonary fibrosis. Chronic bronchitic changes. 3. Possible superimposed alveolar infiltrates in the left lower lung. Electronically Signed   By: Elsie Gravely M.D.   On: 12/21/2023 19:44    Microbiology: Recent Results (from the past 240 hours)  Resp panel by RT-PCR (RSV, Flu A&B, Covid) Anterior Nasal Swab     Status: None   Collection Time: 12/21/23  7:06 PM   Specimen: Anterior Nasal Swab  Result Value Ref Range Status   SARS Coronavirus 2 by RT PCR NEGATIVE NEGATIVE Final   Influenza A by PCR NEGATIVE NEGATIVE Final   Influenza B by PCR NEGATIVE NEGATIVE Final    Comment: (NOTE) The Xpert Xpress SARS-CoV-2/FLU/RSV plus assay is intended as an aid in the diagnosis of influenza from Nasopharyngeal swab specimens and should not be used as a sole basis for treatment. Nasal washings and aspirates are unacceptable for Xpert Xpress SARS-CoV-2/FLU/RSV testing.  Fact Sheet for Patients: BloggerCourse.com  Fact Sheet for Healthcare Providers: SeriousBroker.it  This test is not yet approved or cleared by the [place]  FDA and has been authorized for detection and/or diagnosis of SARS-CoV-2 by FDA under an Emergency Use Authorization (EUA). This EUA will remain in effect (meaning this test can be used) for the duration of the COVID-19 declaration  under Section 564(b)(1) of the Act, 21 U.S.C. section 360bbb-3(b)(1), unless the authorization is terminated or revoked.     Resp Syncytial Virus by PCR NEGATIVE NEGATIVE Final    Comment: (NOTE) Fact Sheet for Patients: BloggerCourse.com  Fact Sheet for Healthcare Providers: SeriousBroker.it  This test is not yet approved or cleared by the [place]  FDA and has been authorized for detection and/or diagnosis of SARS-CoV-2 by FDA under an Emergency Use Authorization (EUA). This EUA will remain in effect (meaning this test can be used) for the duration of the COVID-19 declaration under Section 564(b)(1) of the Act, 21 U.S.C. section 360bbb-3(b)(1), unless the authorization is terminated or revoked.  Performed at [facility], [address], [place], [place] [address]      Labs: Basic Metabolic Panel: Recent Labs  Lab 12/21/23 1907 12/21/23 1913 12/21/23 2224 12/22/23 0508 12/23/23 0252 12/23/23 1402 12/24/23 0201 12/25/23 0210  NA 127* 129*  --  129* 135  --  133* 133*  K 4.5 4.7  --  4.5 4.1  --  4.6 5.1  CL 93*  --   --  95* 97*  --  94* 95*  CO2 22  --   --  21* 29  --  29 29  GLUCOSE 109*  --   --  157* 94  --  107* 111*  BUN 12  --   --  14 22  --  24* 26*  CREATININE 1.13*  --   --  1.02* 1.08*  --  1.06* 1.12*  CALCIUM  8.5*  --   --  8.4* 8.5*  --  9.1 9.0  MG  --   --  1.6*  --   --  2.0  --   --   PHOS  --   --   --  4.0  --   --   --   --    Liver Function Tests: Recent Labs  Lab 12/21/23 1907  AST 48*  ALT 26  ALKPHOS 140*  BILITOT 1.2  PROT 8.6*  ALBUMIN 3.5   Recent Labs  Lab 12/21/23 1907  LIPASE 24   No results for input(s): AMMONIA in the last 168 hours. CBC: Recent Labs  Lab 12/21/23 1907 12/21/23 1913 12/22/23 0508 12/23/23 0252 12/24/23 0201 12/25/23 0210  WBC 11.4*  --  6.7 6.4 6.7 5.1  HGB 8.4* 10.9* 8.4* 8.0* 8.5* 8.3*  HCT 30.4* 32.0* 29.8* 28.6* 30.3* 29.5*   MCV 70.9*  --  69.5* 69.2* 69.2* 69.7*  PLT 343  --  294 316 333 340   Cardiac Enzymes: No results for input(s): CKTOTAL, CKMB, CKMBINDEX, TROPONINI in the last 168 hours. BNP: BNP (last 3 results) Recent Labs    08/29/23 2120 12/21/23 1907  BNP 314.3* 1,251.5*    ProBNP (last 3 results) No results for input(s): PROBNP in the last 8760 hours.  CBG: No results for input(s): GLUCAP in the last 168 hours.     Signed:  Sigurd Pac MD.  [facility] 12/25/2023, 9:38 AM

## 2024-03-16 ENCOUNTER — Other Ambulatory Visit: Payer: Self-pay

## 2024-03-16 ENCOUNTER — Inpatient Hospital Stay (HOSPITAL_COMMUNITY)
Admission: EM | Admit: 2024-03-16 | Discharge: 2024-03-19 | DRG: 291 | Discharge status: Against Medical Advice | Attending: Internal Medicine | Admitting: Internal Medicine

## 2024-03-16 ENCOUNTER — Emergency Department (HOSPITAL_COMMUNITY)

## 2024-03-16 ENCOUNTER — Encounter (HOSPITAL_COMMUNITY): Payer: Self-pay | Admitting: Emergency Medicine

## 2024-03-16 DIAGNOSIS — Z9981 Dependence on supplemental oxygen: Secondary | ICD-10-CM

## 2024-03-16 DIAGNOSIS — I50813 Acute on chronic right heart failure: Secondary | ICD-10-CM | POA: Diagnosis not present

## 2024-03-16 DIAGNOSIS — Z8619 Personal history of other infectious and parasitic diseases: Secondary | ICD-10-CM

## 2024-03-16 DIAGNOSIS — E871 Hypo-osmolality and hyponatremia: Principal | ICD-10-CM | POA: Diagnosis present

## 2024-03-16 DIAGNOSIS — K7030 Alcoholic cirrhosis of liver without ascites: Secondary | ICD-10-CM | POA: Diagnosis present

## 2024-03-16 DIAGNOSIS — I5082 Biventricular heart failure: Secondary | ICD-10-CM | POA: Diagnosis present

## 2024-03-16 DIAGNOSIS — Z823 Family history of stroke: Secondary | ICD-10-CM

## 2024-03-16 DIAGNOSIS — I110 Hypertensive heart disease with heart failure: Secondary | ICD-10-CM | POA: Diagnosis present

## 2024-03-16 DIAGNOSIS — Z716 Tobacco abuse counseling: Secondary | ICD-10-CM

## 2024-03-16 DIAGNOSIS — Z8249 Family history of ischemic heart disease and other diseases of the circulatory system: Secondary | ICD-10-CM

## 2024-03-16 DIAGNOSIS — I5033 Acute on chronic diastolic (congestive) heart failure: Secondary | ICD-10-CM | POA: Diagnosis present

## 2024-03-16 DIAGNOSIS — K219 Gastro-esophageal reflux disease without esophagitis: Secondary | ICD-10-CM | POA: Diagnosis present

## 2024-03-16 DIAGNOSIS — Z720 Tobacco use: Secondary | ICD-10-CM | POA: Diagnosis present

## 2024-03-16 DIAGNOSIS — Z79899 Other long term (current) drug therapy: Secondary | ICD-10-CM

## 2024-03-16 DIAGNOSIS — I2721 Secondary pulmonary arterial hypertension: Secondary | ICD-10-CM | POA: Diagnosis present

## 2024-03-16 DIAGNOSIS — J441 Chronic obstructive pulmonary disease with (acute) exacerbation: Secondary | ICD-10-CM | POA: Diagnosis present

## 2024-03-16 DIAGNOSIS — I5030 Unspecified diastolic (congestive) heart failure: Secondary | ICD-10-CM | POA: Diagnosis not present

## 2024-03-16 DIAGNOSIS — J8410 Pulmonary fibrosis, unspecified: Secondary | ICD-10-CM | POA: Diagnosis present

## 2024-03-16 DIAGNOSIS — F411 Generalized anxiety disorder: Secondary | ICD-10-CM | POA: Diagnosis present

## 2024-03-16 DIAGNOSIS — F32A Depression, unspecified: Secondary | ICD-10-CM | POA: Diagnosis present

## 2024-03-16 DIAGNOSIS — D509 Iron deficiency anemia, unspecified: Secondary | ICD-10-CM | POA: Diagnosis present

## 2024-03-16 DIAGNOSIS — F17210 Nicotine dependence, cigarettes, uncomplicated: Secondary | ICD-10-CM | POA: Diagnosis present

## 2024-03-16 DIAGNOSIS — I10 Essential (primary) hypertension: Secondary | ICD-10-CM | POA: Diagnosis present

## 2024-03-16 DIAGNOSIS — I2781 Cor pulmonale (chronic): Secondary | ICD-10-CM | POA: Diagnosis present

## 2024-03-16 DIAGNOSIS — J9601 Acute respiratory failure with hypoxia: Secondary | ICD-10-CM | POA: Diagnosis not present

## 2024-03-16 DIAGNOSIS — Z59868 Other specified financial insecurity: Secondary | ICD-10-CM

## 2024-03-16 DIAGNOSIS — J9621 Acute and chronic respiratory failure with hypoxia: Secondary | ICD-10-CM | POA: Diagnosis present

## 2024-03-16 DIAGNOSIS — K7681 Hepatopulmonary syndrome: Secondary | ICD-10-CM | POA: Diagnosis present

## 2024-03-16 DIAGNOSIS — K7460 Unspecified cirrhosis of liver: Secondary | ICD-10-CM | POA: Diagnosis present

## 2024-03-16 DIAGNOSIS — I509 Heart failure, unspecified: Secondary | ICD-10-CM

## 2024-03-16 LAB — URINALYSIS, ROUTINE W REFLEX MICROSCOPIC
Bacteria, UA: NONE SEEN
Bilirubin Urine: NEGATIVE
Glucose, UA: NEGATIVE mg/dL
Hgb urine dipstick: NEGATIVE
Ketones, ur: NEGATIVE mg/dL
Leukocytes,Ua: NEGATIVE
Nitrite: NEGATIVE
Protein, ur: NEGATIVE mg/dL
Specific Gravity, Urine: 1.005 (ref 1.005–1.030)
pH: 6 (ref 5.0–8.0)

## 2024-03-16 LAB — BASIC METABOLIC PANEL WITH GFR
Anion gap: 11 (ref 5–15)
BUN: 16 mg/dL (ref 8–23)
CO2: 24 mmol/L (ref 22–32)
Calcium: 8.6 mg/dL — ABNORMAL LOW (ref 8.9–10.3)
Chloride: 86 mmol/L — ABNORMAL LOW (ref 98–111)
Creatinine, Ser: 0.98 mg/dL (ref 0.44–1.00)
GFR, Estimated: >60 mL/min (ref >60)
Glucose, Bld: 88 mg/dL (ref 70–99)
Potassium: 4.3 mmol/L (ref 3.5–5.1)
Sodium: 121 mmol/L — ABNORMAL LOW (ref 135–145)

## 2024-03-16 LAB — CBC WITH DIFFERENTIAL/PLATELET
Abs Immature Granulocytes: 0.02 K/uL (ref 0.00–0.07)
Basophils Absolute: 0 K/uL (ref 0.0–0.1)
Basophils Relative: 0 %
Eosinophils Absolute: 0 K/uL (ref 0.0–0.5)
Eosinophils Relative: 0 %
HCT: 33.4 % — ABNORMAL LOW (ref 36.0–46.0)
Hemoglobin: 10.3 g/dL — ABNORMAL LOW (ref 12.0–15.0)
Immature Granulocytes: 0 %
Lymphocytes Relative: 18 %
Lymphs Abs: 1.1 K/uL (ref 0.7–4.0)
MCH: 23.2 pg — ABNORMAL LOW (ref 26.0–34.0)
MCHC: 30.8 g/dL (ref 30.0–36.0)
MCV: 75.2 fL — ABNORMAL LOW (ref 80.0–100.0)
Monocytes Absolute: 0.6 K/uL (ref 0.1–1.0)
Monocytes Relative: 10 %
Neutro Abs: 4.4 K/uL (ref 1.7–7.7)
Neutrophils Relative %: 72 %
Platelets: 383 K/uL (ref 150–400)
RBC: 4.44 MIL/uL (ref 3.87–5.11)
RDW: 18.4 % — ABNORMAL HIGH (ref 11.5–15.5)
WBC: 6.2 K/uL (ref 4.0–10.5)
nRBC: 0 % (ref 0.0–0.2)

## 2024-03-16 LAB — OSMOLALITY, URINE: Osmolality, Ur: 158 mosm/kg — ABNORMAL LOW (ref 300–900)

## 2024-03-16 LAB — SODIUM, URINE, RANDOM: Sodium, Ur: <30 mmol/L

## 2024-03-16 LAB — PRO BRAIN NATRIURETIC PEPTIDE: Pro Brain Natriuretic Peptide: 8746 pg/mL — ABNORMAL HIGH (ref <300.0)

## 2024-03-16 MED ORDER — FUROSEMIDE 10 MG/ML IJ SOLN
40.0000 mg | Freq: Once | INTRAMUSCULAR | Status: AC
  Administered 2024-03-16: 22:00:00 40 mg via INTRAVENOUS
  Filled 2024-03-16: qty 4

## 2024-03-16 MED ORDER — HYDROXYZINE HCL 10 MG PO TABS
10.0000 mg | ORAL_TABLET | Freq: Three times a day (TID) | ORAL | Status: DC | PRN
  Administered 2024-03-18 – 2024-03-19 (×3): 10 mg via ORAL
  Filled 2024-03-16 (×3): qty 1

## 2024-03-16 MED ORDER — SODIUM CHLORIDE 0.9% FLUSH
3.0000 mL | Freq: Two times a day (BID) | INTRAVENOUS | Status: DC
  Administered 2024-03-16 – 2024-03-19 (×6): 3 mL via INTRAVENOUS

## 2024-03-16 MED ORDER — ONDANSETRON HCL 4 MG/2ML IJ SOLN: 4.0000 mg | Freq: Four times a day (QID) | INTRAMUSCULAR | Status: DC | PRN

## 2024-03-16 MED ORDER — NICOTINE 21 MG/24HR TD PT24
21.0000 mg | MEDICATED_PATCH | Freq: Every day | TRANSDERMAL | Status: DC
  Administered 2024-03-16 – 2024-03-19 (×4): 21 mg via TRANSDERMAL
  Filled 2024-03-16 (×4): qty 1

## 2024-03-16 MED ORDER — BUDESONIDE 0.25 MG/2ML IN SUSP
0.2500 mg | Freq: Two times a day (BID) | RESPIRATORY_TRACT | Status: DC
  Administered 2024-03-16 – 2024-03-19 (×4): 0.25 mg via RESPIRATORY_TRACT
  Filled 2024-03-16 (×5): qty 2

## 2024-03-16 MED ORDER — ENOXAPARIN SODIUM 40 MG/0.4ML IJ SOSY
40.0000 mg | PREFILLED_SYRINGE | INTRAMUSCULAR | Status: DC
  Administered 2024-03-17 – 2024-03-18 (×2): 40 mg via SUBCUTANEOUS
  Filled 2024-03-16 (×3): qty 0.4

## 2024-03-16 MED ORDER — NICOTINE 21 MG/24HR TD PT24
21.0000 mg | MEDICATED_PATCH | Freq: Once | TRANSDERMAL | Status: DC
  Filled 2024-03-16: qty 1

## 2024-03-16 MED ORDER — IPRATROPIUM-ALBUTEROL 0.5-2.5 (3) MG/3ML IN SOLN
3.0000 mL | Freq: Once | RESPIRATORY_TRACT | Status: AC
  Administered 2024-03-16: 19:00:00 3 mL via RESPIRATORY_TRACT
  Filled 2024-03-16: qty 3

## 2024-03-16 MED ORDER — SPIRONOLACTONE 25 MG PO TABS
25.0000 mg | ORAL_TABLET | Freq: Every day | ORAL | Status: DC
  Administered 2024-03-17 – 2024-03-19 (×3): 25 mg via ORAL
  Filled 2024-03-16: qty 2
  Filled 2024-03-16 (×2): qty 1

## 2024-03-16 MED ORDER — METHYLPREDNISOLONE SODIUM SUCC 40 MG IJ SOLR
40.0000 mg | Freq: Two times a day (BID) | INTRAMUSCULAR | Status: DC
  Administered 2024-03-17 (×2): 40 mg via INTRAVENOUS
  Filled 2024-03-16 (×2): qty 1

## 2024-03-16 MED ORDER — ARFORMOTEROL TARTRATE 15 MCG/2ML IN NEBU
15.0000 ug | INHALATION_SOLUTION | Freq: Two times a day (BID) | RESPIRATORY_TRACT | Status: DC
  Administered 2024-03-16 – 2024-03-19 (×6): 15 ug via RESPIRATORY_TRACT
  Filled 2024-03-16 (×6): qty 2

## 2024-03-16 MED ORDER — ACETAMINOPHEN 325 MG PO TABS
650.0000 mg | ORAL_TABLET | Freq: Four times a day (QID) | ORAL | Status: DC | PRN
  Administered 2024-03-17 – 2024-03-18 (×3): 650 mg via ORAL
  Filled 2024-03-16 (×3): qty 2

## 2024-03-16 MED ORDER — SENNOSIDES-DOCUSATE SODIUM 8.6-50 MG PO TABS: 1.0000 | ORAL_TABLET | Freq: Every evening | ORAL | Status: DC | PRN

## 2024-03-16 MED ORDER — METOPROLOL TARTRATE 12.5 MG HALF TABLET
12.5000 mg | ORAL_TABLET | Freq: Two times a day (BID) | ORAL | Status: DC
  Administered 2024-03-17 – 2024-03-19 (×5): 12.5 mg via ORAL
  Filled 2024-03-16 (×5): qty 1

## 2024-03-16 MED ORDER — ONDANSETRON HCL 4 MG PO TABS: 4.0000 mg | ORAL_TABLET | Freq: Four times a day (QID) | ORAL | Status: DC | PRN

## 2024-03-16 MED ORDER — POLYSACCHARIDE IRON COMPLEX 150 MG PO CAPS
150.0000 mg | ORAL_CAPSULE | Freq: Every day | ORAL | Status: DC
  Administered 2024-03-17 – 2024-03-19 (×3): 150 mg via ORAL
  Filled 2024-03-16 (×3): qty 1

## 2024-03-16 MED ORDER — ACETAMINOPHEN 650 MG RE SUPP: 650.0000 mg | Freq: Four times a day (QID) | RECTAL | Status: DC | PRN

## 2024-03-16 MED ORDER — IPRATROPIUM-ALBUTEROL 0.5-2.5 (3) MG/3ML IN SOLN: 3.0000 mL | Freq: Four times a day (QID) | RESPIRATORY_TRACT | Status: DC | PRN

## 2024-03-16 NOTE — H&P (Signed)
 History and Physical    [name] FMW:[id number] DOB: Oct 11, 1955 DOA: 03/16/2024  PCP: Zachary Idelia LABOR, MD  Patient coming from: Home  I have personally briefly reviewed patient's old medical records in [facility]  Chief Complaint: Shortness of breath  HPI: Kathryn Banks is a 68 y.o. female with medical history significant for COPD/pulmonary fibrosis, chronic respiratory failure with hypoxia on 4 L O2 [place], chronic HFpEF (EF 65-70%), hepatic cirrhosis, HTN, iron  deficiency anemia, tobacco use who presented to the ED for evaluation of shortness of breath.  Patient reports 1 week of progressive shortness of breath and lower extremity edema.  Dyspnea is worse when lying flat.  She states that she has been taking her home Lasix  with good urine output.  She denies any chest pain.  She reports wearing 4 L O2 via [place] at all times but has had to increase to 5 L at times this week.  EMS were called to her home for increased shortness of breath.  Per ED triage documentation SpO2 was 65% while on her home 4 L O2 via [place].  She was placed on 15 L NRB and given 125 mg Solu-Medrol  en route to the ED.  Patient has since been weaned down to 6 L O2 HFNC.  ED Course  Labs/Imaging on admission: I have personally reviewed following labs and imaging studies.  Initial vitals showed BP 106/67, pulse 71, RR 21, temp 98.5 F, SpO2 93% on 6 L O2 via HFNC.  Labs showed proBNP 8746, sodium 121, potassium 4.3, bicarb 24, BUN 16, creatinine 0.98, serum glucose 88, WBC 6.2, hemoglobin 10.3, platelets 383.  Portable chest x-ray showed mild cardiomegaly, chronic interstitial lung disease with diffuse interstitial prominence.  Patient was given DuoNeb treatment.  The hospitalist service was consulted for admission.  Review of Systems: All systems reviewed and are negative except as documented in history of present illness above.   Past Medical History:  Diagnosis Date   Acid reflux    Arthritis    FINGERS    COPD (chronic obstructive pulmonary disease) (HCC)    Depression    ETOH abuse    Hepatitis    C-PT STATED SHE WAS TOLD THIS 2014   Hypertension    OFF MEDS FOR FEW YEARS    Past Surgical History:  Procedure Laterality Date   CESAREAN SECTION     OPEN REDUCTION INTERNAL FIXATION (ORIF) DISTAL RADIAL FRACTURE Right 10/15/2017   Procedure: OPEN REDUCTION INTERNAL FIXATION (ORIF) DISTAL RADIAL FRACTURE;  Surgeon: Kathlynn Sharper, MD;  Location: [facility] ORS;  Service: Orthopedics;  Laterality: Right;    Social History: Patient reports smoking 0.5 PPD.  Allergies[1]  Family History  Problem Relation Age of Onset   Hypertension Mother    Ulcers Mother        Recent bleeding ulcers requiring transfusion, 2020   Cancer Father    Hypertension Father    CAD Father 78   CVA Sister 70   Breast cancer Neg Hx      Prior to Admission medications  Medication Sig Start Date End Date Taking? Authorizing Provider  albuterol  (PROVENTIL ) (2.5 MG/3ML) 0.083% nebulizer solution Take 3 mLs (2.5 mg total) by nebulization every 2 (two) hours as needed for wheezing. Patient taking differently: Take 2.5 mg by nebulization daily as needed for wheezing or shortness of breath. 09/01/23   Sherrill Cable Latif, DO  ALPRAZolam  (XANAX ) 1 MG tablet Take 1 mg by mouth at bedtime.    [provider]  amLODipine  (NORVASC ) 5 MG tablet Take 1 tablet (5 mg total) by mouth daily. 02/01/19 12/22/23  Vernon Ranks, MD  baclofen  (LIORESAL ) 20 MG tablet Take 20 mg by mouth every 6 (six) hours. 01/06/23   [provider]  budesonide -glycopyrrolate -formoterol  (BREZTRI  AEROSPHERE) 160-9-4.8 MCG/ACT AERO inhaler Inhale 2 puffs into the lungs in the morning and at bedtime. Patient not taking: Reported on 12/22/2023 09/01/23   Sherrill Cable Latif, DO  cyanocobalamin  (VITAMIN B12) 1000 MCG tablet Take 1,000 mcg by mouth in the morning.    [provider]  diphenhydramine -acetaminophen  (TYLENOL  PM) 25-500 MG TABS  tablet Take 2 tablets by mouth at bedtime.    [provider]  furosemide  (LASIX ) 40 MG tablet Take 1 tablet (40 mg total) by mouth daily. 12/06/22   Leotis Bogus, MD  iron  polysaccharides (NIFEREX) 150 MG capsule Take 1 capsule (150 mg total) by mouth daily. 12/26/23   Fairy Frames, MD  MAGNESIUM  OXIDE 400 PO Take 1 tablet by mouth in the morning and at bedtime.    [provider]  metoprolol  tartrate (LOPRESSOR ) 25 MG tablet Take 0.5 tablets (12.5 mg total) by mouth 2 (two) times daily. 02/01/19 12/22/23  Vernon Ranks, MD  nicotine  (NICODERM CQ  - DOSED IN MG/24 HOURS) 21 mg/24hr patch Place 21 mg onto the skin as needed. Patient not taking: Reported on 12/22/2023    [provider]  omeprazole  (PRILOSEC  OTC) 20 MG tablet Take 20 mg by mouth daily.    [provider]  ondansetron  (ZOFRAN ) 4 MG tablet Take 1 tablet (4 mg total) by mouth every 6 (six) hours as needed for nausea. 09/01/23   Sheikh, Omair Latif, DO  senna-docusate (SENOKOT-S) 8.6-50 MG tablet Take 1 tablet by mouth at bedtime. Patient taking differently: Take 1 tablet by mouth 2 (two) times daily. 09/01/23   Sherrill Cable Latif, DO  sertraline  (ZOLOFT ) 25 MG tablet Take 1 tablet (25 mg total) by mouth at bedtime. 12/05/22 12/22/23  Leotis Bogus, MD  SPIRIVA  RESPIMAT 2.5 MCG/ACT AERS SMARTSIG:2 inhalation Via Inhaler Daily 12/06/23   [provider]  spironolactone  (ALDACTONE ) 25 MG tablet Take 1 tablet (25 mg total) by mouth daily. 12/27/23   Fairy Frames, MD    Physical Exam: Vitals:   03/16/24 1900 03/16/24 2015 03/16/24 2030 03/16/24 2045  BP: 107/67 125/70 137/71 122/69  Pulse: 70 80 83 84  Resp: (!) 21     Temp:      TempSrc:      SpO2: 93% 93% 92% 97%   Constitutional: Chronically ill-appearing woman resting bed with head elevated.  NAD, calm, comfortable Eyes: EOMI, lids and conjunctivae normal ENMT: Mucous membranes are moist. Posterior pharynx clear of any exudate or  lesions.Normal dentition.  Neck: normal, supple, no masses. Respiratory: Distant breath sounds with rhonchi bilaterally. Normal respiratory effort while on 6 L O2 via [place]. No accessory muscle use.  Cardiovascular: Regular rate and rhythm, no murmurs / rubs / gallops.  +2 bilateral lower extremity edema up to the knees. 2+ pedal pulses. Abdomen: Soft, no tenderness Musculoskeletal: no clubbing / cyanosis. No joint deformity upper and lower extremities. Good ROM, no contractures. Normal muscle tone.  Skin: no rashes, lesions, ulcers. No induration Neurologic: Sensation intact. Strength 5/5 in all 4.  Psychiatric: Normal judgment and insight. Alert and oriented x 3. Normal mood.   EKG: Personally reviewed. Sinus rhythm, rate 69, incomplete RBBB and LAFB.  Not significantly changed compared to previous.  Assessment/Plan Principal Problem:  Acute on chronic respiratory failure with hypoxia (HCC) Active Problems:   Acute on chronic heart failure with preserved ejection fraction (HFpEF, >= 50%) (HCC)   Acute exacerbation of chronic obstructive pulmonary disease (COPD) (HCC)   Hyponatremia   Essential hypertension   Cirrhosis (HCC)   GAD (generalized anxiety disorder)   Iron  deficiency anemia   Tobacco use   Kathryn Banks is a 68 y.o. female with medical history significant for COPD/pulmonary fibrosis, chronic respiratory failure with hypoxia on 4 L O2 [place], chronic HFpEF (EF 65-70%), hepatic cirrhosis, HTN, iron  deficiency anemia, tobacco use who is admitted with acute on chronic hypoxic respiratory failure secondary to HFpEF and COPD exacerbations.  Assessment and Plan: Acute on chronic HFpEF: Patient presenting with progressive dyspnea, orthopnea, lower extremity edema.  proBNP >8700.  CXR with mild cardiomegaly, chronic interstitial lung disease findings.  TTE 12/22/2023 showed EF 65-70%, mildly enlarged RV. - IV Lasix  40 mg once now - Continue Lopressor  12.5 mg twice daily - Resume  spironolactone  25 mg daily - Strict I/O's and daily weights  COPD/pulmonary fibrosis with acute exacerbation: Continue Brovana /Pulmicort  BID, DuoNebs as needed, IV Solu-Medrol  40 mg daily.  Acute on chronic respiratory failure with hypoxia: Secondary to HFpEF and COPD exacerbations.  Patient wears 4 L of O2 via [place] at baseline.  She was noted to be hypoxic to 65% on home 4 L with EMS and is required 6 L via [place] at time of admission.  Continue management as above and wean to home O2 as able.  Hyponatremia: Sodium 121 on admission.  Suspect hypervolemic.  Given IV Lasix  40 mg times once now and will recheck labs in AM.  Obtain urine sodium, urine and serum osmolality.  Hypertension: Currently normotensive.  Continue spironolactone  and Lasix .  Hold amlodipine  for now.  Cirrhosis: Does not appear to be acutely decompensated though could be contributing to hypervolemia.  Continue spironolactone  and Lasix .  Iron  deficiency anemia: Hemoglobin stable.  Continue iron  supplement.  Depression/anxiety: Holding sertraline  for now given hyponatremia.  Tobacco use: Patient reports smoking 0.5 PPD.  Smoking cessation advised.  Nicotine  patch provided.   DVT prophylaxis: enoxaparin  (LOVENOX ) injection 40 mg Start: 03/16/24 2200 Code Status: Full code, confirmed with patient on admission Family Communication: Discussed with patient, she has discussed with family Disposition Plan: From home, dispo pending clinical progress Consults called: None Severity of Illness: The appropriate patient status for this patient is INPATIENT. Inpatient status is judged to be reasonable and necessary in order to provide the required intensity of service to ensure the patient's safety. The patient's presenting symptoms, physical exam findings, and initial radiographic and laboratory data in the context of their chronic comorbidities is felt to place them at high risk for further clinical deterioration. Furthermore, it is  not anticipated that the patient will be medically stable for discharge from the hospital within 2 midnights of admission.   * I certify that at the point of admission it is my clinical judgment that the patient will require inpatient hospital care spanning beyond 2 midnights from the point of admission due to high intensity of service, high risk for further deterioration and high frequency of surveillance required.DEWAINE Jorie Blanch MD [facility]  If 7PM-7AM, please contact night-coverage [email or web address]  03/16/2024, 9:26 PM      [1] No Known Allergies

## 2024-03-16 NOTE — ED Provider Notes (Signed)
 [place] EMERGENCY DEPARTMENT AT [facility] Provider Note   CSN: [id number] Arrival date & time: 03/16/24  1839     Patient presents with: Shortness of Breath   Kathryn Banks is a 68 y.o. female.  With a history of COPD and heart failure who presents to the ED for shortness of breath.  34 days of ongoing shortness of breath and increased peripheral edema despite furosemide  at home.  4 L O2 at baseline nasal cannula.  Noted to be 65% on 4 L nasal cannula by EMS upon arrival.  EMS gave 125 mg IV Solu-Medrol  and started patient on 15 L nonrebreather supplemental oxygen  with improvement in oxygenation.  Denies chest pain fevers chills nausea vomiting    Shortness of Breath      Prior to Admission medications  Medication Sig Start Date End Date Taking? Authorizing Provider  albuterol  (PROVENTIL ) (2.5 MG/3ML) 0.083% nebulizer solution Take 3 mLs (2.5 mg total) by nebulization every 2 (two) hours as needed for wheezing. Patient taking differently: Take 2.5 mg by nebulization daily as needed for wheezing or shortness of breath. 09/01/23  Yes Sheikh, Omair Latif, DO  ALPRAZolam  (XANAX ) 1 MG tablet Take 1 mg by mouth at bedtime.   Yes [provider]  amLODipine  (NORVASC ) 5 MG tablet Take 1 tablet (5 mg total) by mouth daily. 02/01/19 03/16/24 Yes Pahwani, Fredia, MD  baclofen  (LIORESAL ) 20 MG tablet Take 20 mg by mouth every 6 (six) hours. 01/06/23  Yes [provider]  budesonide -glycopyrrolate -formoterol  (BREZTRI  AEROSPHERE) 160-9-4.8 MCG/ACT AERO inhaler Inhale 2 puffs into the lungs in the morning and at bedtime. 09/01/23  Yes Sheikh, Omair Latif, DO  diphenhydramine -acetaminophen  (TYLENOL  PM) 25-500 MG TABS tablet Take 2 tablets by mouth at bedtime.   Yes [provider]  furosemide  (LASIX ) 40 MG tablet Take 1 tablet (40 mg total) by mouth daily. 12/06/22  Yes Leotis Bogus, MD  MAGNESIUM  OXIDE 400 PO Take 1 tablet by mouth in the morning and at bedtime.    Yes [provider]  metoprolol  tartrate (LOPRESSOR ) 25 MG tablet Take 0.5 tablets (12.5 mg total) by mouth 2 (two) times daily. 02/01/19 03/16/24 Yes Pahwani, Fredia, MD  nicotine  (NICODERM CQ  - DOSED IN MG/24 HOURS) 21 mg/24hr patch Place 21 mg onto the skin as needed.   Yes [provider]  omeprazole  (PRILOSEC  OTC) 20 MG tablet Take 20 mg by mouth daily.   Yes [provider]  senna-docusate (SENOKOT-S) 8.6-50 MG tablet Take 1 tablet by mouth at bedtime. Patient taking differently: Take 1 tablet by mouth 2 (two) times daily. 09/01/23  Yes Sheikh, Omair Latif, DO  sertraline  (ZOLOFT ) 25 MG tablet Take 1 tablet (25 mg total) by mouth at bedtime. 12/05/22 03/16/24 Yes Leotis Bogus, MD  SPIRIVA  RESPIMAT 2.5 MCG/ACT AERS SMARTSIG:2 inhalation Via Inhaler Daily 12/06/23  Yes [provider]  spironolactone  (ALDACTONE ) 25 MG tablet Take 1 tablet (25 mg total) by mouth daily. 12/27/23  Yes Fairy Frames, MD  iron  polysaccharides (NIFEREX) 150 MG capsule Take 1 capsule (150 mg total) by mouth daily. Patient not taking: Reported on 03/16/2024 12/26/23   Fairy Frames, MD  ondansetron  (ZOFRAN ) 4 MG tablet Take 1 tablet (4 mg total) by mouth every 6 (six) hours as needed for nausea. Patient not taking: Reported on 03/16/2024 09/01/23   Sheikh, Omair Latif, DO    Allergies: Patient has no known allergies.    Review of Systems  Respiratory:  Positive for shortness of breath.  Updated Vital Signs BP 122/69   Pulse 84   Temp 98.5 F (36.9 C) (Oral)   Resp (!) 21   LMP 09/25/2005   SpO2 97%   Physical Exam Vitals and nursing note reviewed.  HENT:     Head: Normocephalic and atraumatic.  Eyes:     Pupils: Pupils are equal, round, and reactive to light.  Cardiovascular:     Rate and Rhythm: Normal rate and regular rhythm.  Pulmonary:     Effort: Pulmonary effort is normal.     Breath sounds: Examination of the right-lower field reveals wheezing. Examination  of the left-lower field reveals wheezing. Wheezing present. No rales.  Abdominal:     Palpations: Abdomen is soft.     Tenderness: There is no abdominal tenderness.  Musculoskeletal:     Right lower leg: Edema present.     Left lower leg: Edema present.  Skin:    General: Skin is warm and dry.  Neurological:     Mental Status: She is alert.  Psychiatric:        Mood and Affect: Mood normal.     (all labs ordered are listed, but only abnormal results are displayed) Labs Reviewed  PRO BRAIN NATRIURETIC PEPTIDE - Abnormal; Notable for the following components:      Result Value   Pro Brain Natriuretic Peptide 8,746.0 (*)    All other components within normal limits  BASIC METABOLIC PANEL WITH GFR - Abnormal; Notable for the following components:   Sodium 121 (*)    Chloride 86 (*)    Calcium  8.6 (*)    All other components within normal limits  CBC WITH DIFFERENTIAL/PLATELET - Abnormal; Notable for the following components:   Hemoglobin 10.3 (*)    HCT 33.4 (*)    MCV 75.2 (*)    MCH 23.2 (*)    RDW 18.4 (*)    All other components within normal limits  OSMOLALITY  URINALYSIS, ROUTINE W REFLEX MICROSCOPIC  SODIUM, URINE, RANDOM  MAGNESIUM   CBC  BASIC METABOLIC PANEL WITH GFR  OSMOLALITY, URINE    EKG: EKG Interpretation Date/Time:  Wednesday March 16 2024 19:01:47 EST Ventricular Rate:  69 PR Interval:  171 QRS Duration:  112 QT Interval:  423 QTC Calculation: 454 R Axis:   -67  Text Interpretation: Sinus rhythm Incomplete RBBB and LAFB Confirmed by Pamella Sharper [phone]) on 03/16/2024 8:03:15 PM  Radiology: DG Chest Portable 1 View Result Date: 03/16/2024 EXAM: 1 VIEW(S) XRAY OF THE CHEST 03/16/2024 07:09:00 PM COMPARISON: 12/21/2023 CLINICAL HISTORY: hf copd FINDINGS: LUNGS AND PLEURA: Chronic interstitial lung disease and diffuse interstitial prominence. No pleural effusion. No pneumothorax. HEART AND MEDIASTINUM: Mild cardiomegaly. Aortic arch  atherosclerosis, mild tortuosity and calcification. BONES AND SOFT TISSUES: Thoracic spondylosis. No acute osseous abnormality. IMPRESSION: 1. No acute findings. 2. Mild cardiomegaly. 3. Chronic interstitial lung disease with diffuse interstitial prominence. Electronically signed by: Greig Pique MD 03/16/2024 07:12 PM EST RP Workstation: [id number]     .Critical Care  Performed by: Pamella Sharper LABOR, DO Authorized by: Pamella Sharper LABOR, DO   Critical care provider statement:    Critical care time (minutes):  30   Critical care was necessary to treat or prevent imminent or life-threatening deterioration of the following conditions:  Respiratory failure   Critical care was time spent personally by me on the following activities:  Development of treatment plan with patient or surrogate, discussions with consultants, evaluation of patient's response to treatment, examination of patient, ordering and  review of laboratory studies, ordering and review of radiographic studies, ordering and performing treatments and interventions, pulse oximetry, re-evaluation of patient's condition and review of old charts   I assumed direction of critical care for this patient from another provider in my specialty: no     Care discussed with: admitting provider      Medications Ordered in the ED  enoxaparin  (LOVENOX ) injection 40 mg (has no administration in time range)  sodium chloride  flush (NS) 0.9 % injection 3 mL (3 mLs Intravenous Given 03/16/24 2137)  acetaminophen  (TYLENOL ) tablet 650 mg (has no administration in time range)    Or  acetaminophen  (TYLENOL ) suppository 650 mg (has no administration in time range)  ondansetron  (ZOFRAN ) tablet 4 mg (has no administration in time range)    Or  ondansetron  (ZOFRAN ) injection 4 mg (has no administration in time range)  senna-docusate (Senokot-S) tablet 1 tablet (has no administration in time range)  arformoterol  (BROVANA ) nebulizer solution 15 mcg (15 mcg  Nebulization Given 03/16/24 2135)  budesonide  (PULMICORT ) nebulizer solution 0.25 mg (has no administration in time range)  ipratropium-albuterol  (DUONEB) 0.5-2.5 (3) MG/3ML nebulizer solution 3 mL (has no administration in time range)  nicotine  (NICODERM CQ  - dosed in mg/24 hours) patch 21 mg (has no administration in time range)  methylPREDNISolone  sodium succinate (SOLU-MEDROL ) 40 mg/mL injection 40 mg (has no administration in time range)  hydrOXYzine  (ATARAX ) tablet 10 mg (has no administration in time range)  iron  polysaccharides (NIFEREX) capsule 150 mg (has no administration in time range)  metoprolol  tartrate (LOPRESSOR ) tablet 12.5 mg (has no administration in time range)  spironolactone  (ALDACTONE ) tablet 25 mg (has no administration in time range)  ipratropium-albuterol  (DUONEB) 0.5-2.5 (3) MG/3ML nebulizer solution 3 mL (3 mLs Nebulization Given 03/16/24 1916)  furosemide  (LASIX ) injection 40 mg (40 mg Intravenous Given 03/16/24 2134)    Clinical Course as of 03/16/24 2143  Wed Mar 16, 2024  2100 Laboratory workup notable for hyponatremia 121.  No significant leukocytosis.  BNP elevated at 8746.  Patient remains stable on O2 nasal cannula although increased from her baseline still at 6 L.  Will give a dose of Lasix  here and admit to medicine.  Discussed with Dr. Tobie who accepts patient for admission [MP]    Clinical Course User Index [MP] Pamella Ozell LABOR, DO                                 Medical Decision Making 68 year old female with history as above presenting to the ED for increased peripheral edema and shortness of breath.  Increased oxygen  requirement up from 4 L at baseline to 6 L nasal cannula here.  Did require 15 L nonrebreather for brief time with EMS.  EMS gave 125 Solu-Medrol  prior to arrival.  No appreciable crackles on my exam but some faint wheezing at both bases.  Will give DuoNeb treatment and continue to monitor closely.  Differential leg gnosis would  include COPD exacerbation, heart failure exacerbation, viral respiratory illness and pneumonia.  Will obtain laboratory workup chest x-ray and reassess after DuoNeb treatment.  Amount and/or Complexity of Data Reviewed Labs: ordered. Radiology: ordered.  Risk Prescription drug management. Decision regarding hospitalization.        Final diagnoses:  Hyponatremia  Acute hypoxemic respiratory failure (HCC)  COPD exacerbation (HCC)  Acute on chronic congestive heart failure, unspecified heart failure type [facility])    ED Discharge Orders  None          Pamella Ozell LABOR, DO 03/16/24 2143

## 2024-03-16 NOTE — Hospital Course (Signed)
 Kathryn Banks is a 68 y.o. female with medical history significant for COPD/pulmonary fibrosis, chronic respiratory failure with hypoxia on 4 L O2 [place], chronic HFpEF (EF 65-70%), hepatic cirrhosis, HTN, iron  deficiency anemia, tobacco use who is admitted with acute on chronic hypoxic respiratory failure secondary to HFpEF and COPD exacerbations.

## 2024-03-16 NOTE — ED Notes (Signed)
 RT called by RN

## 2024-03-16 NOTE — ED Triage Notes (Signed)
 BIBA from home w/ c/o increased [facility] & extremity swelling & abd swelling. Hx CHF. 4L o2 at baseline. 65% upon EMS arrival on her 4L. NRB 15L by EMS. Decreased lung sounds inspiratory. Expiratory rhonchi 18G LFA 125 solumedrol given en route  110/60 BP  A&O x 4

## 2024-03-16 NOTE — ED Notes (Signed)
 XR at bedside

## 2024-03-17 DIAGNOSIS — J9601 Acute respiratory failure with hypoxia: Secondary | ICD-10-CM

## 2024-03-17 DIAGNOSIS — J441 Chronic obstructive pulmonary disease with (acute) exacerbation: Secondary | ICD-10-CM

## 2024-03-17 DIAGNOSIS — I2721 Secondary pulmonary arterial hypertension: Secondary | ICD-10-CM | POA: Diagnosis not present

## 2024-03-17 DIAGNOSIS — J9621 Acute and chronic respiratory failure with hypoxia: Secondary | ICD-10-CM | POA: Diagnosis not present

## 2024-03-17 DIAGNOSIS — E871 Hypo-osmolality and hyponatremia: Secondary | ICD-10-CM | POA: Diagnosis not present

## 2024-03-17 DIAGNOSIS — I509 Heart failure, unspecified: Secondary | ICD-10-CM

## 2024-03-17 DIAGNOSIS — I50813 Acute on chronic right heart failure: Secondary | ICD-10-CM | POA: Diagnosis not present

## 2024-03-17 LAB — HEPATIC FUNCTION PANEL
ALT: 15 U/L (ref 0–44)
AST: 27 U/L (ref 15–41)
Albumin: 3.8 g/dL (ref 3.5–5.0)
Alkaline Phosphatase: 113 U/L (ref 38–126)
Bilirubin, Direct: 0.4 mg/dL — ABNORMAL HIGH (ref 0.0–0.2)
Indirect Bilirubin: 0.4 mg/dL (ref 0.3–0.9)
Total Bilirubin: 0.8 mg/dL (ref 0.0–1.2)
Total Protein: 7.6 g/dL (ref 6.5–8.1)

## 2024-03-17 LAB — CBC
HCT: 37.9 % (ref 36.0–46.0)
Hemoglobin: 11.6 g/dL — ABNORMAL LOW (ref 12.0–15.0)
MCH: 23.2 pg — ABNORMAL LOW (ref 26.0–34.0)
MCHC: 30.6 g/dL (ref 30.0–36.0)
MCV: 75.6 fL — ABNORMAL LOW (ref 80.0–100.0)
Platelets: 411 K/uL — ABNORMAL HIGH (ref 150–400)
RBC: 5.01 MIL/uL (ref 3.87–5.11)
RDW: 18.7 % — ABNORMAL HIGH (ref 11.5–15.5)
WBC: 4.7 K/uL (ref 4.0–10.5)
nRBC: 0 % (ref 0.0–0.2)

## 2024-03-17 LAB — BASIC METABOLIC PANEL WITH GFR
Anion gap: 11 (ref 5–15)
BUN: 16 mg/dL (ref 8–23)
CO2: 24 mmol/L (ref 22–32)
Calcium: 9 mg/dL (ref 8.9–10.3)
Chloride: 87 mmol/L — ABNORMAL LOW (ref 98–111)
Creatinine, Ser: 0.95 mg/dL (ref 0.44–1.00)
GFR, Estimated: >60 mL/min (ref >60)
Glucose, Bld: 140 mg/dL — ABNORMAL HIGH (ref 70–99)
Potassium: 4.5 mmol/L (ref 3.5–5.1)
Sodium: 123 mmol/L — ABNORMAL LOW (ref 135–145)

## 2024-03-17 LAB — MAGNESIUM: Magnesium: 2 mg/dL (ref 1.7–2.4)

## 2024-03-17 LAB — OSMOLALITY: Osmolality: 267 mosm/kg — ABNORMAL LOW (ref 275–295)

## 2024-03-17 LAB — SODIUM: Sodium: 127 mmol/L — ABNORMAL LOW (ref 135–145)

## 2024-03-17 MED ORDER — FUROSEMIDE 10 MG/ML IJ SOLN
40.0000 mg | Freq: Every day | INTRAMUSCULAR | Status: DC
  Administered 2024-03-17: 13:00:00 40 mg via INTRAVENOUS
  Filled 2024-03-17: qty 4

## 2024-03-17 MED ORDER — PANTOPRAZOLE SODIUM 40 MG PO TBEC
40.0000 mg | DELAYED_RELEASE_TABLET | Freq: Every day | ORAL | Status: DC
  Administered 2024-03-18 – 2024-03-19 (×2): 40 mg via ORAL
  Filled 2024-03-17 (×2): qty 1

## 2024-03-17 MED ORDER — MAGNESIUM OXIDE -MG SUPPLEMENT 400 (240 MG) MG PO TABS
400.0000 mg | ORAL_TABLET | Freq: Two times a day (BID) | ORAL | Status: DC
  Administered 2024-03-17 – 2024-03-19 (×5): 400 mg via ORAL
  Filled 2024-03-17 (×5): qty 1

## 2024-03-17 MED ORDER — OMEPRAZOLE MAGNESIUM 20 MG PO TBEC: 20.0000 mg | DELAYED_RELEASE_TABLET | Freq: Every day | ORAL | Status: DC

## 2024-03-17 MED ORDER — ALPRAZOLAM 0.5 MG PO TABS
1.0000 mg | ORAL_TABLET | Freq: Every day | ORAL | Status: DC
  Administered 2024-03-17 – 2024-03-18 (×2): 1 mg via ORAL
  Filled 2024-03-17 (×2): qty 2

## 2024-03-17 NOTE — Plan of Care (Signed)
°  Patient is progressing towards goals of care    Problem: Education: Goal: Knowledge of General Education information will improve Description: Including pain rating scale, medication(s)/side effects and non-pharmacologic comfort measures Outcome: Progressing   Problem: Health Behavior/Discharge Planning: Goal: Ability to manage health-related needs will improve Outcome: Progressing   Problem: Clinical Measurements: Goal: Ability to maintain clinical measurements within normal limits will improve Outcome: Progressing Goal: Will remain free from infection Outcome: Progressing Goal: Diagnostic test results will improve Outcome: Progressing Goal: Respiratory complications will improve Outcome: Progressing Goal: Cardiovascular complication will be avoided Outcome: Progressing   Problem: Activity: Goal: Risk for activity intolerance will decrease Outcome: Progressing   Problem: Nutrition: Goal: Adequate nutrition will be maintained Outcome: Progressing   Problem: Coping: Goal: Level of anxiety will decrease Outcome: Progressing   Problem: Elimination: Goal: Will not experience complications related to bowel motility Outcome: Progressing Goal: Will not experience complications related to urinary retention Outcome: Progressing   Problem: Pain Managment: Goal: General experience of comfort will improve and/or be controlled Outcome: Progressing   Problem: Safety: Goal: Ability to remain free from injury will improve Outcome: Progressing   Problem: Skin Integrity: Goal: Risk for impaired skin integrity will decrease Outcome: Progressing

## 2024-03-17 NOTE — Progress Notes (Signed)
 [facility]                                                                              Kathryn Banks, is a 68 y.o. female, DOB - 1955/06/21, FMW:[id number] Admit date - 03/16/2024    Outpatient Primary MD for the patient is Zachary Idelia LABOR, MD  LOS - 1  days  Chief Complaint  Patient presents with   Shortness of Breath       Brief summary   Patient is a 68 year old female with COPD/pulmonary fibrosis, chronic respiratory failure with hypoxia on 4 L O2 via [place], chronic HFpEF (EF 65-70%), hepatic cirrhosis, HTN, iron  deficiency anemia, tobacco use who presented to the ED for evaluation of shortness of breath.   Patient reported 1 week of progressive shortness of breath and lower extremity edema.  Dyspnea  worse when lying flat.  She states that she has been taking her home Lasix  with good urine output.  Denied any chest pain.  She reported wearing 4 L O2 via [place] at all times but has had to increase to 5 L at times this week.  EMS were called to her home for increased shortness of breath.  Per ED triage documentation SpO2 was 65% while on her home 4 L O2 via [place].  She was placed on 15 L NRB and given 125 mg Solu-Medrol  en route to the ED.  Patient has since been weaned down to 6 L O2 HFNC. BNP 8746 sodium 121, creatinine 0.98.  Chest x-ray showed mild cardiomegaly, chronic interstitial lung disease with diffuse interstitial prominence.  Patient was admitted for further workup.  Assessment & Plan       Acute on chronic respiratory failure with hypoxia (HCC) -Multifactorial likely due to acute on chronic diastolic CHF, COPD with pulmonary fibrosis - Currently on O2 6 L via [place], wean O2 as tolerated, at baseline on 4 L    Acute on chronic HFpEF: -Presented with progressive dyspnea, orthopnea, PND, LE edema, BNP 8746, chest x-ray with pulmonary edema  - 2D echo 12/22/2023 showed EF 65-70%, mildly enlarged RV. -Continue IV Lasix  40 mg daily, strict I's and O's and daily  weights -Patient was resumed on Lopressor  and spironolactone  -Negative balance of 950 cc - Follows cardiology outpatient, cardiology consulted    COPD/pulmonary fibrosis with acute exacerbation: -Continue Brovana /Pulmicort  BID - Continue DuoNebs as needed - Continue IV Solu-Medrol  40 mg q12hrs   Hyponatremia: - Likely due to hypervolemia, sodium 121 on admission -Urine osmolality 158, urine sodium less than 30, serum osmolality 267 -Received Lasix  40 mg in ED, sodium improving to 123 - Follow BMP   Hypertension: -Continue Lasix , Aldactone , BB.  Hold amlodipine     Cirrhosis: Does not appear to be acutely decompensated though could be contributing to hypervolemia.  Continue spironolactone  and Lasix .   Iron  deficiency anemia:   Continue iron  supplement. - H&H stable   Depression/anxiety: Holding sertraline  for now given hyponatremia.   Tobacco use: Patient reports smoking 0.5 PPD.  Smoking cessation advised.  -Continue nicotine  patch  Estimated body mass index is 22.59 kg/m as calculated from the following:   Height as of  12/21/23: 5' 4 (1.626 m).   Weight as of 12/25/23: 59.7 kg.  Code Status: Full code DVT Prophylaxis:  enoxaparin  (LOVENOX ) injection 40 mg Start: 03/16/24 2200   Level of Care: Level of care: Progressive Family Communication: Updated patient Disposition Plan:      Remains inpatient appropriate:      Procedures:    Consultants:   Cardiology  Antimicrobials:   Anti-infectives (From admission, onward)    None          Medications  arformoterol   15 mcg Nebulization BID   budesonide  (PULMICORT ) nebulizer solution  0.25 mg Nebulization BID   enoxaparin  (LOVENOX ) injection  40 mg Subcutaneous Q24H   furosemide   40 mg Intravenous Daily   iron  polysaccharides  150 mg Oral Daily   methylPREDNISolone  (SOLU-MEDROL ) injection  40 mg Intravenous Q12H   metoprolol  tartrate  12.5 mg Oral BID   nicotine   21 mg Transdermal Daily   sodium  chloride flush  3 mL Intravenous Q12H   spironolactone   25 mg Oral Daily      Subjective:   Kathryn Banks was seen and examined today.  On 6 L O2 via [place], feels somewhat better after IV Lasix  in the ED.  No acute chest pain, abdominal pain, N/V/D/C, new weakness. LE edema+ Objective:   Vitals:   03/17/24 0735 03/17/24 0735 03/17/24 0800 03/17/24 0900  BP:   (!) 142/78   Pulse:   92 99  Resp:   14 16  Temp: 98 F (36.7 C)     TempSrc: Oral     SpO2:  95% 91% 93%    Intake/Output Summary (Last 24 hours) at 03/17/2024 1036 Last data filed at 03/17/2024 0200 Gross per 24 hour  Intake --  Output 950 ml  Net -950 ml     Wt Readings from Last 3 Encounters:  12/25/23 59.7 kg  09/23/23 61.2 kg  08/29/23 65.2 kg     Exam General: Alert and oriented x 3, NAD Cardiovascular: S1 S2 auscultated,  RRR Respiratory: Diminished breath sound at the bases, scattered rhonchi Gastrointestinal: Soft, nontender, nondistended, + bowel sounds Ext: 2+ pedal edema bilaterally Neuro: No new deficits Psych: Normal affect     Data Reviewed:  I have personally reviewed following labs    CBC Lab Results  Component Value Date   WBC 4.7 03/17/2024   RBC 5.01 03/17/2024   HGB 11.6 (L) 03/17/2024   HCT 37.9 03/17/2024   MCV 75.6 (L) 03/17/2024   MCH 23.2 (L) 03/17/2024   PLT 411 (H) 03/17/2024   MCHC 30.6 03/17/2024   RDW 18.7 (H) 03/17/2024   LYMPHSABS 1.1 03/16/2024   MONOABS 0.6 03/16/2024   EOSABS 0.0 03/16/2024   BASOSABS 0.0 03/16/2024     Last metabolic panel Lab Results  Component Value Date   NA 123 (L) 03/17/2024   K 4.5 03/17/2024   CL 87 (L) 03/17/2024   CO2 24 03/17/2024   BUN 16 03/17/2024   CREATININE 0.95 03/17/2024   GLUCOSE 140 (H) 03/17/2024   GFRNONAA >60 03/17/2024   GFRAA >60 01/31/2019   CALCIUM  9.0 03/17/2024   PHOS 4.0 12/22/2023   PROT 8.6 (H) 12/21/2023   ALBUMIN 3.5 12/21/2023   LABGLOB 5.0 (H) 01/14/2023   BILITOT 1.2 12/21/2023   ALKPHOS  140 (H) 12/21/2023   AST 48 (H) 12/21/2023   ALT 26 12/21/2023   ANIONGAP 11 03/17/2024    CBG (last 3)  No results for input(s): GLUCAP in the last 72  hours.    Coagulation Profile: No results for input(s): INR, PROTIME in the last 168 hours.   Radiology Studies: I have personally reviewed the imaging studies  DG Chest Portable 1 View Result Date: 03/16/2024 EXAM: 1 VIEW(S) XRAY OF THE CHEST 03/16/2024 07:09:00 PM COMPARISON: 12/21/2023 CLINICAL HISTORY: hf copd FINDINGS: LUNGS AND PLEURA: Chronic interstitial lung disease and diffuse interstitial prominence. No pleural effusion. No pneumothorax. HEART AND MEDIASTINUM: Mild cardiomegaly. Aortic arch atherosclerosis, mild tortuosity and calcification. BONES AND SOFT TISSUES: Thoracic spondylosis. No acute osseous abnormality. IMPRESSION: 1. No acute findings. 2. Mild cardiomegaly. 3. Chronic interstitial lung disease with diffuse interstitial prominence. Electronically signed by: Greig Pique MD 03/16/2024 07:12 PM EST RP Workstation: [id number]       Nydia Distance M.D. [facility] 03/17/2024, 10:36 AM  Available via Epic secure chat 7am-7pm After 7 pm, please refer to night coverage provider listed on amion.

## 2024-03-17 NOTE — Progress Notes (Signed)
° °  Brief Progress Note   _____________________________________________________________________________________________________________  Patient Name: Kathryn Banks Patient DOB: 03/14/1956 Date: @TODAY @      Data: Reviewed vital signs, labs, and clinical notes.    Action: No action required at this time.     Response:  Requiring O2 4-6L  _____________________________________________________________________________________________________________  [facility] RN Expeditor Rhiannon Sassaman S Delania Ferg Please contact us  directly via secure chat (search for [facility]) or by calling us  at [phone] [facility]).

## 2024-03-17 NOTE — Progress Notes (Signed)
 Heart Failure Navigator Progress Note  Assessed for Heart & Vascular TOC clinic readiness.  Patient does not meet criteria due to she is a [facility] Cardiology clinic patient. No HF TOC. .   Navigator will sign off at this time.   Stephane Haddock, BSN, Scientist, Clinical (histocompatibility And Immunogenetics) Only

## 2024-03-17 NOTE — ED Notes (Signed)
 PT took her Prilosec  from her own supply. Patient educated at this time regarding not taking home medication. PT agreeable to this at this time.

## 2024-03-17 NOTE — ED Notes (Signed)
 Breathing is even and unlabored.  Call light within reach encouraged to use when needs arise.

## 2024-03-17 NOTE — ED Notes (Signed)
 Pt assisted to the bedside commode and back to bed without issue. CB in reach.

## 2024-03-17 NOTE — Consult Note (Addendum)
 Cardiology Consultation   Patient ID: Kathryn Banks MRN: [id number]; DOB: 05-07-1955  Admit date: 03/16/2024 Date of Consult: 03/17/2024  PCP:  Zachary Idelia LABOR, MD   [facility] Providers Cardiologist:  None        Patient Profile: Kathryn Banks is a 68 y.o. female with a hx of hypertension, COPD and pulmonary fibrosis on home oxygen , current tobacco use, hepatitis C c/b cirrhosis, anxiety/depression, GERD, anemia, hx of EOTH abuse who is being seen 03/17/2024 for the evaluation of heart failure at the request of Ripudeep Rai MD.  History of Present Illness: Kathryn Banks was admitted 11/2022 for progressive shortness of breath with productive cough. Sje was admitted for a COPD exacerbation. Hospital course was c/b volume overload ? New cirrhosis vs new diastolic heart failure. She received IV diuresis prior to discharge. She did follow up with cardiology at [facility] that following month. Patient reported doing well and no further work-up was conducted. She was continued on her current cardiac medications. Does not appear she followed up with them.   Re-hospitalized 08/2023 for COPD exacerbation and was advised to follow up with GI for cirrhosis work-up. ID for Hep C treatment, and pulmonary.   Admitted 11/2023 for COPD exacerbation and HF exacerbation.   Presented to the ED today for progressive SOB and peripheral edema.  BP: 107/67  On 6L HFNC ECG: sinus rhythm, IVCD VR 69 CXR showed mild cardiomegaly and chronic interstitial lung disease  Pertinent lab work: Na 121 ProBNP 8746 Blood osmolality 267 Chronic microcytic anemia  She has received one dose of IV lasix  40 mg and one breathing treatment.  No weight recorded this admission.   On interview, patient reported progressive shortness of breath with cough and significant orthopnea and PND- at the point unable to fall asleep. Has noticed peripheral edema. Has not taken lasix  at home in a long time. Appetite has been down.   Denied fever or chills.  Past Medical History:  Diagnosis Date   Acid reflux    Arthritis    FINGERS   COPD (chronic obstructive pulmonary disease) (HCC)    Depression    ETOH abuse    Hepatitis    C-PT STATED SHE WAS TOLD THIS 2014   Hypertension    OFF MEDS FOR FEW YEARS    Past Surgical History:  Procedure Laterality Date   CESAREAN SECTION     OPEN REDUCTION INTERNAL FIXATION (ORIF) DISTAL RADIAL FRACTURE Right 10/15/2017   Procedure: OPEN REDUCTION INTERNAL FIXATION (ORIF) DISTAL RADIAL FRACTURE;  Surgeon: Kathlynn Sharper, MD;  Location: [facility] ORS;  Service: Orthopedics;  Laterality: Right;       Scheduled Meds:  arformoterol   15 mcg Nebulization BID   budesonide  (PULMICORT ) nebulizer solution  0.25 mg Nebulization BID   enoxaparin  (LOVENOX ) injection  40 mg Subcutaneous Q24H   furosemide   40 mg Intravenous Daily   iron  polysaccharides  150 mg Oral Daily   methylPREDNISolone  (SOLU-MEDROL ) injection  40 mg Intravenous Q12H   metoprolol  tartrate  12.5 mg Oral BID   nicotine   21 mg Transdermal Daily   sodium chloride  flush  3 mL Intravenous Q12H   spironolactone   25 mg Oral Daily   Continuous Infusions:  PRN Meds: acetaminophen  **OR** acetaminophen , hydrOXYzine , ipratropium-albuterol , ondansetron  **OR** ondansetron  (ZOFRAN ) IV, senna-docusate  Allergies:   Allergies[1]  Social History:   Social History   Socioeconomic History   Marital status: Married    Spouse name: Not on file   Number of children: Not  on file   Years of education: Not on file   Highest education level: Not on file  Occupational History   Occupation: [employer]  Tobacco Use   Smoking status: Every Day    Current packs/day: 0.50    Average packs/day: 1 pack/day for 46.3 years (45.7 ttl pk-yrs)    Types: Cigarettes    Start date: 11/28/2022   Smokeless tobacco: Never  Vaping Use   Vaping status: Never Used  Substance and Sexual Activity   Alcohol use: Not Currently    Comment: OCC --  beer and vodka; 1-2 every few days; Off of alcohol for 2 months   Drug use: Not Currently    Types: Opium, Benzodiazepines, Heroin    Comment: remote history, quit about 1985   Sexual activity: Yes  Other Topics Concern   Not on file  Social History Narrative   Not on file   Social Drivers of Health   Tobacco Use: High Risk (03/16/2024)   Patient History    Smoking Tobacco Use: Every Day    Smokeless Tobacco Use: Never    Passive Exposure: Not on file  Financial Resource Strain: Medium Risk (01/23/2023)   Received from [facility]   Overall Financial Resource Strain (CARDIA)    Difficulty of Paying Living Expenses: Somewhat hard  Food Insecurity: No Food Insecurity (12/23/2023)   Epic    Worried About Running Out of Food in the Last Year: Never true    Ran Out of Food in the Last Year: Never true  Transportation Needs: No Transportation Needs (12/23/2023)   Epic    Lack of Transportation (Medical): No    Lack of Transportation (Non-Medical): No  Physical Activity: Not on file  Stress: Not on file  Social Connections: Socially Integrated (12/23/2023)   Social Connection and Isolation Panel    Frequency of Communication with Friends and Family: More than three times a week    Frequency of Social Gatherings with Friends and Family: Once a week    Attends Religious Services: 1 to 4 times per year    Active Member of [employer] or Organizations: Yes    Attends Banker Meetings: More than 4 times per year    Marital Status: Living with partner  Intimate Partner Violence: Not At Risk (12/23/2023)   Epic    Fear of Current or Ex-Partner: No    Emotionally Abused: No    Physically Abused: No    Sexually Abused: No  Depression (PHQ2-9): Not on file  Alcohol Screen: Not on file  Housing: Low Risk (12/23/2023)   Epic    Unable to Pay for Housing in the Last Year: No    Number of Times Moved in the Last Year: 0    Homeless in the Last Year: No  Utilities:  Not At Risk (12/23/2023)   Epic    Threatened with loss of utilities: No  Health Literacy: Not on file    Family History:    Family History  Problem Relation Age of Onset   Hypertension Mother    Ulcers Mother        Recent bleeding ulcers requiring transfusion, 2020   Cancer Father    Hypertension Father    CAD Father 66   CVA Sister 66   Breast cancer Neg Hx      ROS:  Please see the history of present illness.  All other ROS reviewed and negative.     Physical Exam/Data: Vitals:  03/17/24 0735 03/17/24 0800 03/17/24 0900 03/17/24 1100  BP:  (!) 142/78  116/74  Pulse:  92 99 83  Resp:  14 16 18   Temp:      TempSrc:      SpO2: 95% 91% 93% 96%    Intake/Output Summary (Last 24 hours) at 03/17/2024 1223 Last data filed at 03/17/2024 0200 Gross per 24 hour  Intake --  Output 950 ml  Net -950 ml      12/25/2023    5:12 AM 12/24/2023    4:53 AM 12/23/2023    4:36 AM  Last 3 Weights  Weight (lbs) 131 lb 9.8 oz 129 lb 10.1 oz 134 lb 4.2 oz  Weight (kg) 59.7 kg 58.8 kg 60.9 kg     There is no height or weight on file to calculate BMI.  General:  Chronically ill appearing woman in no acute distress though uncomfortable appearing HEENT: normal Neck: JVD Cardiac:  normal S1, S2; RRR; no murmur  Lungs:  Bibasilar crackles Abd: soft, distended, tenderness to upper quadrants.  Ext: 1+ pitting edema with tenderness to palpation. Erythema to ankles  Skin: dry and warm, though toe cold Psych:  Normal affect   EKG:  The EKG was personally reviewed and demonstrates:  see hpi Telemetry:  Telemetry was personally reviewed and demonstrates:sinus rhythm with PVCs HR 80  Relevant CV Studies: Echocardiogram 12/22/23 IMPRESSIONS     1. Left ventricular ejection fraction, by estimation, is 65 to 70%. The  left ventricle has normal function. The left ventricle has no regional  wall motion abnormalities. Left ventricular diastolic parameters were  normal.   2. Right  ventricular systolic function is normal. The right ventricular  size is mildly enlarged.   3. Left atrial size was moderately dilated.   4. The mitral valve is normal in structure. No evidence of mitral valve  regurgitation.   5. The aortic valve is tricuspid. Aortic valve regurgitation is not  visualized. No aortic stenosis is present.   6. The inferior vena cava is normal in size with greater than 50%  respiratory variability, suggesting right atrial pressure of 3 mmHg.   Laboratory Data:  Chemistry Recent Labs  Lab 03/16/24 1916 03/17/24 0130  NA 121* 123*  K 4.3 4.5  CL 86* 87*  CO2 24 24  GLUCOSE 88 140*  BUN 16 16  CREATININE 0.98 0.95  CALCIUM  8.6* 9.0  MG  --  2.0  GFRNONAA >60 >60  ANIONGAP 11 11    Hematology Recent Labs  Lab 03/16/24 1916 03/17/24 0130  WBC 6.2 4.7  RBC 4.44 5.01  HGB 10.3* 11.6*  HCT 33.4* 37.9  MCV 75.2* 75.6*  MCH 23.2* 23.2*  MCHC 30.8 30.6  RDW 18.4* 18.7*  PLT 383 411*   BNP Recent Labs  Lab 03/16/24 1916  PROBNP 8,746.0*     Radiology/Studies:  DG Chest Portable 1 View Result Date: 03/16/2024 EXAM: 1 VIEW(S) XRAY OF THE CHEST 03/16/2024 07:09:00 PM COMPARISON: 12/21/2023 CLINICAL HISTORY: hf copd FINDINGS: LUNGS AND PLEURA: Chronic interstitial lung disease and diffuse interstitial prominence. No pleural effusion. No pneumothorax. HEART AND MEDIASTINUM: Mild cardiomegaly. Aortic arch atherosclerosis, mild tortuosity and calcification. BONES AND SOFT TISSUES: Thoracic spondylosis. No acute osseous abnormality. IMPRESSION: 1. No acute findings. 2. Mild cardiomegaly. 3. Chronic interstitial lung disease with diffuse interstitial prominence. Electronically signed by: Greig Pique MD 03/16/2024 07:12 PM EST RP Workstation: [id number]     Assessment and Plan: Volume Overload Decompensated Cirrhosis? Hyponatremia 2/2 Hypervolemia  Given degree of hyponatremia this admission and echo findings from previous, volume overload  unlikely to be 2/2 diastolic dysfunction  RV dysfunction could be at play, etiology most likely COPD/fibrosis. Decompensated cirrhosis likely, LFTs pending    No weight recorded. UOP recorded, no intake recorded On exam, appears volume up  Patient has not been seen by GI. Would recommend consult.  Continue IV diuresis, though caution with hyponatremia With lack of systolic heart failure and AF/AFL consider stopping lopressor  with her advanced lung disease.   Hypertension BP: 116/74 Medications as above  Risk Stratification No lipid panel obtained since 2020 Lipid panel pending in am  Per primary COPD Pulmonary Fibrosis EOTH and Tobacco use Cirrhosis  Chronic microcytic anemia  GERD Anxiety/ Depression  Risk Assessment/Risk Scores:         For questions or updates, please contact [facility] Please consult [email or web address] for contact info under      Signed, Leontine LOISE Salen, PA-C  03/17/2024 12:23 PM   I have seen and examined the patient along with Leontine LOISE Salen, PA-C .  I have reviewed the chart, notes and new data.  I agree with PA/NP's note.  Key new complaints: her breathing is slightly better and she can speak in uninterrupted sentences. She reports she is trying to quit smoking. Key examination changes: Bilateral 2+ leg edema to the knees, but with some wrinkling, suggesting improvement. Distended abdomen, no clear ascites. JVP 8 cm. Diminished breath sounds throughout with occasional wheezes. No gallops or murmurs.  Key new findings / data: She has been hyponatremic since 2019, but is worse than ever now. Na 123 (Osm 267), normal renal parameters, proBNP 8746, Hgb 11.6, Fe deficient. Reviewed multiple echocardiograms. She has normal LV systolic and normal LV diastolic function (note mitral annulus e' velocity 13 cm/s). There is no evidence of elevated left heart filling pressures. The only left heart abnormality is left atrial dilation. The  right ventricle and right atrium are markedly dilated. Unfortunately, there is no TR jet and we cannot calculate RV systolic pressure. On CT the pulmonary artery is dilated, c/w pulmonary artery HTN. CT and US  are both c/w cirrhosis.  PLAN: She has chronic cor pulmonale that contributes to her edema due to right heart failure.  She does not have left heart failure. Heart disease does not explain her dyspnea. There is indirect evidence for at least moderate pulmonary artery HTN. PAH is due to chronic lung disease and cannot exclude portal HTN related PAH as well. There is imaging evidence for cirrhosis, although synthetic liver function is minimally abnormal and there is hepatopetal portal vein flow.  Cirrhosis is contributing to her hypervolemic hyponatremia.  It may also be contributing to her hypoxemia due to hepatopulmonary syndrome (pulmonary AV shunting). Of note, the saline contrast (bubble) echo study performed 08/31/2023 was deemed as negative since there was no early shunting across the atrial septum, however there is late arrival of saline contrast bubbles in the left heart chambers, consistent with transpulmonary shunting (albeit not an impressive amount). Continue with diuretics and fluid restriction with careful monitoring of her sodium level.  Jerel Balding, MD, [facility] [phone] 03/17/2024, 2:47 PM     [1] No Known Allergies

## 2024-03-18 DIAGNOSIS — J9621 Acute and chronic respiratory failure with hypoxia: Secondary | ICD-10-CM | POA: Diagnosis not present

## 2024-03-18 DIAGNOSIS — I5033 Acute on chronic diastolic (congestive) heart failure: Secondary | ICD-10-CM

## 2024-03-18 LAB — BASIC METABOLIC PANEL WITH GFR
Anion gap: 9 (ref 5–15)
BUN: 25 mg/dL — ABNORMAL HIGH (ref 8–23)
CO2: 27 mmol/L (ref 22–32)
Calcium: 9.5 mg/dL (ref 8.9–10.3)
Chloride: 91 mmol/L — ABNORMAL LOW (ref 98–111)
Creatinine, Ser: 0.91 mg/dL (ref 0.44–1.00)
GFR, Estimated: >60 mL/min
Glucose, Bld: 140 mg/dL — ABNORMAL HIGH (ref 70–99)
Potassium: 4.9 mmol/L (ref 3.5–5.1)
Sodium: 127 mmol/L — ABNORMAL LOW (ref 135–145)

## 2024-03-18 MED ORDER — FUROSEMIDE 10 MG/ML IJ SOLN
40.0000 mg | Freq: Once | INTRAMUSCULAR | Status: AC
  Administered 2024-03-18: 40 mg via INTRAVENOUS
  Filled 2024-03-18: qty 4

## 2024-03-18 MED ORDER — METHYLPREDNISOLONE SODIUM SUCC 40 MG IJ SOLR
INTRAMUSCULAR | Status: AC
  Administered 2024-03-18: 40 mg via INTRAVENOUS
  Filled 2024-03-18: qty 1

## 2024-03-18 MED ORDER — METHYLPREDNISOLONE SODIUM SUCC 40 MG IJ SOLR: 40.0000 mg | Freq: Every day | INTRAMUSCULAR | Status: DC

## 2024-03-18 MED ORDER — FUROSEMIDE 10 MG/ML IJ SOLN
40.0000 mg | Freq: Two times a day (BID) | INTRAMUSCULAR | Status: DC
  Administered 2024-03-18: 40 mg via INTRAVENOUS
  Filled 2024-03-18: qty 4

## 2024-03-18 NOTE — TOC Initial Note (Addendum)
 Transition of Care [facility]) - Initial/Assessment Note    Patient Details  Name: Kathryn Banks MRN: [id number] Date of Birth: 1955-11-15  [facility] CM/SW Contact:    Landry DELENA Senters, RN Phone Number: 03/18/2024, 11:00 AM  Clinical Narrative:                 RR:[id number] history significant for COPD/pulmonary fibrosis, chronic respiratory failure with hypoxia on 4 L O2 [place], chronic HFpEF (EF 65-70%), hepatic cirrhosis, HTN, iron  deficiency anemia, tobacco use who presented to the ED for evaluation of shortness of breath.   Patient lives with S.O., who she reports provides support at home and transportation, will be transportation home.   No PCP, manages own medications, drives herself. DME reviewed-tub bench at home, home oxygen  4L baseline through [employer].  Patient reports alcohol use, 1-2 wine coolers 1-2 times per week. Denies the need for counseling resources for this.   CM set up PCP appt, info on AVS.  CM will continue to follow.   Expected Discharge Plan:  (TBD) Barriers to Discharge: Continued Medical Work up   Patient Goals and CMS Choice            Expected Discharge Plan and Services       Living arrangements for the past 2 months: Single Family Home                                      Prior Living Arrangements/Services Living arrangements for the past 2 months: Single Family Home Lives with:: Self, Significant Other Patient language and need for interpreter reviewed:: Yes Do you feel safe going back to the place where you live?: Yes      Need for Family Participation in Patient Care: Yes (Comment) Care giver support system in place?: Yes (comment) Current home services: DME (tub bench) Criminal Activity/Legal Involvement Pertinent to Current Situation/Hospitalization: No - Comment as needed  Activities of Daily Living   ADL Screening (condition at time of admission) Independently performs ADLs?: Yes (appropriate for developmental age) Is  the patient deaf or have difficulty hearing?: No Does the patient have difficulty seeing, even when wearing glasses/contacts?: No Does the patient have difficulty concentrating, remembering, or making decisions?: No  Permission Sought/Granted                  Emotional Assessment Appearance:: Developmentally appropriate Attitude/Demeanor/Rapport: Engaged Affect (typically observed): Calm Orientation: : Oriented to Self, Oriented to Place, Oriented to  Time, Oriented to Situation Alcohol / Substance Use: Alcohol Use, Tobacco Use Psych Involvement: No (comment)  Admission diagnosis:  Hyponatremia [E87.1] COPD exacerbation (HCC) [J44.1] Acute on chronic respiratory failure with hypoxia (HCC) [J96.21] Acute hypoxemic respiratory failure (HCC) [J96.01] Acute on chronic congestive heart failure, unspecified heart failure type (HCC) [I50.9] Patient Active Problem List   Diagnosis Date Noted   Acute on chronic right heart failure (HCC) 03/17/2024   PAH (pulmonary artery hypertension) (HCC) 03/17/2024   COPD exacerbation (HCC) 03/17/2024   Acute on chronic heart failure with preserved ejection fraction (HFpEF, >= 50%) (HCC) 03/16/2024   Hyponatremia 03/16/2024   Acute on chronic hypoxic respiratory failure (HCC) 12/21/2023   Abnormal SPEP 01/14/2023   Thrombocytosis 01/14/2023   Tobacco use 12/05/2022   Acute pulmonary edema (HCC) 12/03/2022   Acute exacerbation of chronic obstructive pulmonary disease (COPD) (HCC) 12/03/2022   Acute on chronic respiratory failure with hypoxia (HCC) 12/03/2022  Acute on chronic diastolic CHF (congestive heart failure), NYHA class 3 (HCC) 12/02/2022   Community acquired pneumonia of right lower lobe of lung 12/02/2022   Acute hypoxic respiratory failure (HCC) 11/28/2022   Right lower lobe pneumonia 11/28/2022   Continuous dependence on cigarette smoking 11/28/2022   History of depression 11/28/2022   Cirrhosis (HCC) 11/28/2022   GAD (generalized  anxiety disorder) 11/28/2022   Iron  deficiency anemia 11/28/2022   Hyperlipidemia 11/28/2022   Elevated brain natriuretic peptide (BNP) level 11/28/2022   Subclinical hypothyroidism 02/01/2019   Chest pain 01/31/2019   Essential hypertension 01/31/2019   COPD with acute exacerbation (HCC) 01/31/2019   Tobacco dependence 01/31/2019   History of substance abuse (HCC) 01/31/2019   Pulmonary nodules/lesions, multiple 01/31/2019   PCP:  Zachary Idelia LABOR, MD Pharmacy:   [facility] [address], [place] - [address] [place] [address] Phone: [phone] Fax: [phone]  [facility] [address] [place] [address] Phone: [phone] Fax: [phone]     Social Drivers of Health (SDOH) Social History: SDOH Screenings   Food Insecurity: Patient Declined (03/17/2024)  Housing: Patient Declined (03/17/2024)  Transportation Needs: Unmet Transportation Needs (03/17/2024)  Utilities: Patient Declined (03/17/2024)  Financial Resource Strain: Medium Risk (01/23/2023)   Received from [facility]  Social Connections: Unknown (03/17/2024)  Tobacco Use: High Risk (03/16/2024)   SDOH Interventions:     Readmission Risk Interventions    12/24/2023    1:00 PM 09/01/2023    4:22 PM  Readmission Risk Prevention Plan  Transportation Screening Complete Complete  PCP or Specialist Appt within 5-7 Days  Complete  Home Care Screening  Complete  Medication Review (RN CM)  Complete  HRI or Home Care Consult Complete   Palliative Care Screening Not Applicable   Medication Review (RN Care Manager) Complete

## 2024-03-18 NOTE — Progress Notes (Addendum)
 "  Progress Note  Patient Name: Kathryn Banks Date of Encounter: 03/18/2024 [facility] Cardiologist: None   Interval Summary   Shortness of breath is improving.  I's and O's not tracked.  Reports good urinary output.  Vital Signs Vitals:   03/18/24 0200 03/18/24 0400 03/18/24 0500 03/18/24 0506  BP:  124/64    Pulse: 63 64    Resp: 16 (!) 23    Temp:    (!) 97.3 F (36.3 C)  TempSrc:    Oral  SpO2: 94% 93%    Weight:   60 kg     Intake/Output Summary (Last 24 hours) at 03/18/2024 0916 Last data filed at 03/18/2024 0600 Gross per 24 hour  Intake 240 ml  Output 500 ml  Net -260 ml      03/18/2024    5:00 AM 12/25/2023    5:12 AM 12/24/2023    4:53 AM  Last 3 Weights  Weight (lbs) 132 lb 4.4 oz 131 lb 9.8 oz 129 lb 10.1 oz  Weight (kg) 60 kg 59.7 kg 58.8 kg      Telemetry/ECG  Sinus rhythm- Personally Reviewed  Physical Exam  GEN: No acute distress.   Neck: + JVD Cardiac: RRR, no murmurs, rubs, or gallops.  Respiratory: Clear to auscultation bilaterally. GI: Soft, nontender, non-distended  MS: 2+ edema  Patient Profile Patient with past medical history significant for hypertension, COPD and pulmonary fibrosis on home oxygen , current tobacco use, hepatitis C c/b cirrhosis, anxiety/depression, GERD, anemia, hx of EOTH abuse.  Admitted for acute HFpEF exacerbation with underlying cirrhosis.  Assessment & Plan   Acute HFpEF History of cirrhosis secondary to alcohol abuse - 11/2023 EF 65 to 70%.  Normal RV.  No significant valve disease. Diuresing on IV Lasix  40 mg twice daily.  I's and O's not documented accurately.  She reports improvement in symptoms though. Continue with IV Lasix  40 mg twice daily.  Need to document I's and O's more accurately.  Morning dose of Lasix  was held for some reason.  Will give. Reassess urinary output in a couple hours, if not urinating well then will increase to 80 mg for nighttime dose. Continue with spironolactone  25 mg  daily.  Could consider high ratio of spironolactone  to Lasix  given underlying ascites/cirrhosis.  Pulmonary hypertension Dr. Francyne suspect some element of right-sided heart failure with at least moderate pulmonary hypertension noted on indirect measurements on echocardiogram.  Likely secondary to chronic lung disease.  Hypervolemic hyponatremia Improving with diuresis.  COPD On very low dose of Lopressor  12.5 mg twice daily for unclear reasons.  If respiratory status becomes a concern, may consider stopping or switching to bisoprolol if there is some other indication for beta-blocker.   For questions or updates, please contact [facility] Please consult [email or web address] for contact info under       Signed, Thom LITTIE Sluder, PA-C     Kathryn Banks was seen by me today along with Thom Sluder, PA-C. I have personally performed an evaluation on this patient.  My findings are as follows:  68 y.o. female with history of HTN, COPD, pulmonary fibrosis, ongoing tobacco abuse, Hep C/B cirrhosis admitted with acute on chronic diastolic CHF.   She still c/o LE edema and dyspnea.   Data: Labs reviewed by me personally No EKG today Tele: sinus Otherwise, I agree with data as outlined by the advanced practice provider.  Exam performed by me: Gen: NAD Neck: No JVD Cardiac: RRR Lungs: rhonci  diffusely Extremities: 2+ bilateral LE edema  My Assessment and Plan:  Acute on chronic diastolic CHF: She remains volume overloaded. Continue diuresis today with IV Lasix . Strict I/O. Continue aldactone .   Signed,  Lonni Cash, MD  03/18/2024 10:35 AM   "

## 2024-03-18 NOTE — Plan of Care (Signed)
" °  Problem: Clinical Measurements: Goal: Ability to maintain clinical measurements within normal limits will improve Outcome: Progressing Goal: Respiratory complications will improve Outcome: Progressing   Problem: Activity: Goal: Risk for activity intolerance will decrease Outcome: Progressing   Problem: Coping: Goal: Level of anxiety will decrease Outcome: Progressing   Problem: Pain Managment: Goal: General experience of comfort will improve and/or be controlled Outcome: Progressing   Problem: Safety: Goal: Ability to remain free from injury will improve Outcome: Progressing   Problem: Skin Integrity: Goal: Risk for impaired skin integrity will decrease Outcome: Progressing   Problem: Education: Goal: Knowledge of disease or condition will improve Outcome: Progressing Goal: Knowledge of the prescribed therapeutic regimen will improve Outcome: Progressing   Problem: Activity: Goal: Ability to tolerate increased activity will improve Outcome: Progressing   Problem: Respiratory: Goal: Ability to maintain a clear airway will improve Outcome: Progressing Goal: Levels of oxygenation will improve Outcome: Progressing   "

## 2024-03-18 NOTE — Evaluation (Signed)
 Occupational Therapy Evaluation Patient Details Name: Kathryn Banks MRN: [id number] DOB: March 20, 1956 Today's Date: 03/18/2024   History of Present Illness   68 year old female adm 12/19 with COPD/pulmonary fibrosis, chronic respiratory failure with hypoxia on 4 L O2 via , chronic HFpEF (EF 65-70%), hepatic cirrhosis, HTN, iron  deficiency anemia, tobacco use who presented for evaluation of shortness of breath.     Clinical Impressions Patient admitted for the diagnosis above.  PTA she lives at home with family, and needed no assist for ADL,iADL or mobility.  Patient is a little unsteady, also anxious, wanting her Nicotine  Patch.  Patient is essentially at a supervision level, needing assist for line management.  OT can follow in the acute setting to address deficits, but no post acute OT is anticipated.       If plan is discharge home, recommend the following:   Other (comment)     Functional Status Assessment   Patient has had a recent decline in their functional status and demonstrates the ability to make significant improvements in function in a reasonable and predictable amount of time.     Equipment Recommendations   None recommended by OT     Recommendations for Other Services         Precautions/Restrictions   Precautions Precautions: Fall Recall of Precautions/Restrictions: Intact Precaution/Restrictions Comments: watch O2 Restrictions Weight Bearing Restrictions Per Provider Order: No     Mobility Bed Mobility               General bed mobility comments: Sitting EOB upon entering Patient Response: Cooperative  Transfers Overall transfer level: Needs assistance   Transfers: Sit to/from Stand, Bed to chair/wheelchair/BSC Sit to Stand: Supervision     Step pivot transfers: Supervision            Balance Overall balance assessment: Needs assistance Sitting-balance support: Feet supported Sitting balance-Leahy Scale: Normal      Standing balance support: No upper extremity supported Standing balance-Leahy Scale: Fair                             ADL either performed or assessed with clinical judgement   ADL       Grooming: Set up;Sitting               Lower Body Dressing: Supervision/safety;Sit to/from stand   Toilet Transfer: Supervision/safety;BSC/3in1;Stand-pivot                   Vision Baseline Vision/History: 1 Wears glasses Patient Visual Report: No change from baseline       Perception Perception: Not tested       Praxis Praxis: Not tested       Pertinent Vitals/Pain Pain Assessment Pain Assessment: No/denies pain     Extremity/Trunk Assessment Upper Extremity Assessment Upper Extremity Assessment: Overall WFL for tasks assessed   Lower Extremity Assessment Lower Extremity Assessment: Defer to PT evaluation   Cervical / Trunk Assessment Cervical / Trunk Assessment: Normal   Communication Communication Communication: No apparent difficulties   Cognition Arousal: Alert Behavior During Therapy: Anxious Cognition: No apparent impairments                               Following commands: Intact       Cueing  General Comments   Cueing Techniques: Verbal cues   VSS on O2   Exercises     Shoulder  Instructions      Home Living Family/patient expects to be discharged to:: Private residence Living Arrangements: Spouse/significant other Available Help at Discharge: Family;Available PRN/intermittently Type of Home: House Home Access: Stairs to enter [employer] of Steps: 2 Entrance Stairs-Rails: Right Home Layout: One level     Bathroom Shower/Tub: Tub/shower unit;Curtain         Home Equipment: None          Prior Functioning/Environment Prior Level of Function : Independent/Modified Independent;Driving               ADLs Comments: ind, drives    OT Problem List: Decreased activity  tolerance;Impaired balance (sitting and/or standing)   OT Treatment/Interventions: Self-care/ADL training;Therapeutic activities;Balance training;Energy conservation      OT Goals(Current goals can be found in the care plan section)   Acute Rehab OT Goals Patient Stated Goal: Return home OT Goal Formulation: With patient Time For Goal Achievement: 04/01/24 Potential to Achieve Goals: Good ADL Goals Pt Will Perform Grooming: Independently;standing Pt Will Perform Lower Body Dressing: Independently;sit to/from stand Pt Will Transfer to Toilet: Independently;ambulating;regular height toilet   OT Frequency:  Min 2X/week    Co-evaluation              AM-PAC OT 6 Clicks Daily Activity     Outcome Measure Help from another person eating meals?: None Help from another person taking care of personal grooming?: None Help from another person toileting, which includes using toliet, bedpan, or urinal?: A Little Help from another person bathing (including washing, rinsing, drying)?: A Little Help from another person to put on and taking off regular upper body clothing?: None Help from another person to put on and taking off regular lower body clothing?: A Little 6 Click Score: 21   End of Session Equipment Utilized During Treatment: Oxygen  Nurse Communication: Mobility status  Activity Tolerance: Patient tolerated treatment well Patient left: in bed;with call bell/phone within reach  OT Visit Diagnosis: Unsteadiness on feet (R26.81)                Time: 8799-8781 OT Time Calculation (min): 18 min Charges:  OT General Charges $OT Visit: 1 Visit OT Evaluation $OT Eval Moderate Complexity: 1 Mod  03/18/2024  RP, OTR/L  Acute Rehabilitation Services  Office:  [phone]   Charlie JONETTA Halsted 03/18/2024, 12:49 PM

## 2024-03-18 NOTE — Evaluation (Signed)
 Physical Therapy Evaluation Patient Details Name: Kathryn Banks MRN: [id number] DOB: 04-Dec-1955 Today's Date: 03/18/2024  History of Present Illness  68 y.o female presents to [facility] on 12/17 with increased SOB, extremity swelling, and abdominal swelling. Decreased lung sounds and expiratory rhonchi noted. No acute findings of chest x-ray. PMHx: COPD, chronic smoking, HTN, depression, anxiety, hepatitis, cirrhosis.   Clinical Impression  Pt is currently mobilizing below her baseline due to fatigue and shortness of breath. Pt demonstrates good sitting balance while EOB. Pt is CGA for STS and 2-3 steps forward/backward and laterally. Pt is shaky throughout standing and benefits from light hand held assist for balance. Pt is currently on 6L O2 but is on 4L at baseline. Pt declining to ambulate to chair at this time but encouraged to sit in chair for meals to promote skin, muscle, and lung health. Anticipate pt's mobility to improve once symptoms are medically managed. Pt would benefit from continued PT services focused on strength, balance, and gait to promote activity tolerance and safety with functional mobility.         If plan is discharge home, recommend the following: A little help with walking and/or transfers;A little help with bathing/dressing/bathroom;Assistance with cooking/housework;Assist for transportation;Help with stairs or ramp for entrance   Can travel by private vehicle        Equipment Recommendations Cane;BSC/3in1 (Pt requesting elevated toilet cover for bathroom)  Recommendations for Other Services       Functional Status Assessment Patient has had a recent decline in their functional status and demonstrates the ability to make significant improvements in function in a reasonable and predictable amount of time.     Precautions / Restrictions Precautions Precautions: Fall Recall of Precautions/Restrictions: Intact Restrictions Weight Bearing Restrictions Per Provider  Order: No      Mobility  Bed Mobility               General bed mobility comments: Pt sitting EOB upon arrival, requesting to continue sitting EOB at end of session.    Transfers Overall transfer level: Needs assistance Equipment used: 1 person hand held assist Transfers: Sit to/from Stand Sit to Stand: Contact guard assist           General transfer comment: Pt unsteady upon standing, benefits from light hand held assist upon standing for balance.    Ambulation/Gait Ambulation/Gait assistance: Min assist Gait Distance (Feet): 3 Feet (3 steps forward, backward, and L/R.) Assistive device: 1 person hand held assist Gait Pattern/deviations: Step-to pattern, Decreased step length - right, Decreased step length - left, Narrow base of support   Gait velocity interpretation: <1.31 ft/sec, indicative of household ambulator   General Gait Details: Pt tolerating short, shaky steps at this time with light hand held assist. Does not use DME at baseline.  Stairs            Wheelchair Mobility     Tilt Bed    Modified Rankin (Stroke Patients Only)       Balance Overall balance assessment: Mild deficits observed, not formally tested Sitting-balance support: No upper extremity supported Sitting balance-Leahy Scale: Good Sitting balance - Comments: No sitting balance concerns   Standing balance support: Bilateral upper extremity supported Standing balance-Leahy Scale: Fair Standing balance comment: Light B UE via hand held assist. Pt feeling weak and fatigued. Shaky and unsteady while standing.  Pertinent Vitals/Pain Pain Assessment Pain Assessment: No/denies pain    Home Living Family/patient expects to be discharged to:: Private residence Living Arrangements: Spouse/significant other Available Help at Discharge: Family;Available PRN/intermittently Type of Home: House Home Access: Stairs to enter Entrance  Stairs-Rails: Right Entrance Stairs-Number of Steps: 2   Home Layout: One level Home Equipment: Shower seat      Prior Function Prior Level of Function : Independent/Modified Independent             Mobility Comments: Pt reporting increased difficulty performing STS to/from standard toilet height. ADLs Comments: Pt states she is barely managing dressing, bathing, and showering but is still completing independently.     Extremity/Trunk Assessment   Upper Extremity Assessment Upper Extremity Assessment: Defer to OT evaluation    Lower Extremity Assessment Lower Extremity Assessment: Overall WFL for tasks assessed;Generalized weakness    Cervical / Trunk Assessment Cervical / Trunk Assessment: Normal  Communication   Communication Communication: No apparent difficulties    Cognition Arousal: Alert Behavior During Therapy: WFL for tasks assessed/performed   PT - Cognitive impairments: No apparent impairments                         Following commands: Intact       Cueing Cueing Techniques: Verbal cues, Tactile cues, Visual cues     General Comments General comments (skin integrity, edema, etc.): VSS throughout. B LE pititng edema noted 2+.    Exercises     Assessment/Plan    PT Assessment Patient needs continued PT services  PT Problem List Decreased strength;Decreased mobility;Decreased range of motion;Decreased knowledge of precautions;Decreased activity tolerance;Cardiopulmonary status limiting activity;Decreased balance;Decreased knowledge of use of DME;Impaired sensation       PT Treatment Interventions DME instruction;Therapeutic exercise;Gait training;Balance training;Stair training;Functional mobility training;Therapeutic activities;Patient/family education;Neuromuscular re-education    PT Goals (Current goals can be found in the Care Plan section)  Acute Rehab PT Goals Patient Stated Goal: No additional goals stated during  session. Additional Goals Additional Goal #1: Pt will demonstrate ambulation goal above on baseline 4L O2 while maintaining oxygen  saturation >90%.    Frequency Min 2X/week     Co-evaluation               AM-PAC PT 6 Clicks Mobility  Outcome Measure Help needed turning from your back to your side while in a flat bed without using bedrails?: None Help needed moving from lying on your back to sitting on the side of a flat bed without using bedrails?: None Help needed moving to and from a bed to a chair (including a wheelchair)?: A Little Help needed standing up from a chair using your arms (e.g., wheelchair or bedside chair)?: A Little Help needed to walk in hospital room?: A Lot Help needed climbing 3-5 steps with a railing? : A Lot 6 Click Score: 18    End of Session   Activity Tolerance: Patient limited by fatigue Patient left: in bed;with call bell/phone within reach;with family/visitor present (Pt sitting EOB) Nurse Communication: Mobility status PT Visit Diagnosis: Unsteadiness on feet (R26.81);Muscle weakness (generalized) (M62.81)    Time: 9143-9082 PT Time Calculation (min) (ACUTE ONLY): 21 min   Charges:   PT Evaluation $PT Eval Moderate Complexity: 1 Mod   PT General Charges $$ ACUTE PT VISIT: 1 Visit         Sabra Morel, PT, DPT  Acute Rehabilitation Services         Office: [phone]  Synethia Endicott K Boby Eyer 03/18/2024, 1:33 PM

## 2024-03-18 NOTE — Care Management Important Message (Signed)
 Important Message  Patient Details  Name: Kathryn Banks MRN: [id number] Date of Birth: [birth year]   Important Message Given:  Yes - Medicare IM     Claretta Deed 03/18/2024, 2:01 PM

## 2024-03-18 NOTE — TOC CAGE-AID Note (Signed)
 Transition of Care [facility]) - CAGE-AID Screening   Patient Details  Name: Kathryn Banks MRN: [id number] Date of Birth: 02-16-1956  Transition of Care [facility]) CM/SW Contact:    Landry DELENA Senters, RN Phone Number: 03/18/2024, 4:21 PM   Clinical Narrative:  Patient refuses need for inpatient or outpatient counseling resources.   CAGE-AID Screening: Substance Abuse Screening unable to be completed due to: : Patient Refused  Have You Ever Felt You Ought to Cut Down on Your Drinking or Drug Use?: No Have People Annoyed You By Critizing Your Drinking Or Drug Use?: No Have You Felt Bad Or Guilty About Your Drinking Or Drug Use?: No Have You Ever Had a Drink or Used Drugs First Thing In The Morning to Steady Your Nerves or to Get Rid of a Hangover?: No CAGE-AID Score: 0  Substance Abuse Education Offered: Yes

## 2024-03-18 NOTE — Progress Notes (Signed)
 "          [facility]                                                                              Estefany Goebel, is a 68 y.o. female, DOB - 1955/09/18, FMW:[id number] Admit date - 03/16/2024    Outpatient Primary MD for the patient is Zachary Idelia LABOR, MD  LOS - 2  days  Chief Complaint  Patient presents with   Shortness of Breath       Brief summary   Patient is a 68 year old female with COPD/pulmonary fibrosis, chronic respiratory failure with hypoxia on 4 L O2 via [place], chronic HFpEF (EF 65-70%), hepatic cirrhosis, HTN, iron  deficiency anemia, tobacco use who presented to the ED for evaluation of shortness of breath.   In the ER workup was consistent with acute on chronic hypoxic respiratory failure due to CHF exacerbation and she was admitted to the hospital.  Assessment & Plan   Acute on chronic respiratory failure with hypoxia (HCC) due to acute on chronic diastolic CHF exacerbation.  EF 65% on recent echocardiogram  - Also has underlying pulmonary fibrosis and COPD, uses 4 L nasal cannula oxygen  at baseline, currently on 6, does have evidence of fluid overload on exam, cardiology following on IV Lasix  for diuresis along with Aldactone , home dose beta-blocker on board.  Appreciate cardiology input.  Clinically improved, encouraged to sit in chair use I-S and flutter valve for pulmonary toiletry, advance activity and titrate down oxygen .  Acute on chronic HFpEF: See above  COPD/pulmonary fibrosis with acute exacerbation: Continue Brovana /Pulmicort  BID, as needed nebulizer treatments, gentle IV steroids, minimal wheezing, crackles mostly are coarse and consistent with CHF.  On 4 L nasal cannula oxygen  at baseline.  Kindly see #1 for extra details.  Hyponatremia:  Likely due to hypervolemia, diurese and monitor   Hypertension: Continue Lasix , Aldactone , BB.  Hold amlodipine   History of hep C with chronic cirrhosis:  Does not appear to be acutely decompensated though could be  contributing to hypervolemia.  Continue spironolactone  and Lasix .  Postdischarge follow-up with GI, PCP to arrange so far has not followed up with a gastroenterologist.   Iron  deficiency anemia:   Continue iron  supplement.  H&H stable   Depression/anxiety: Holding sertraline  for now given hyponatremia.   Tobacco use: Patient reports smoking 0.5 PPD.  Smoking cessation provided, continue NicoDerm patch.   Estimated body mass index is 22.71 kg/m as calculated from the following:   Height as of 12/21/23: 5' 4 (1.626 m).   Weight as of this encounter: 60 kg.  Code Status: Full code DVT Prophylaxis:  Place TED hose Start: 03/18/24 0807 enoxaparin  (LOVENOX ) injection 40 mg Start: 03/16/24 2200   Level of Care: Level of care: Progressive Family Communication: Updated patient Disposition Plan:      Remains inpatient appropriate:      Procedures:    Consultants:   Cardiology  Antimicrobials:   Anti-infectives (From admission, onward)    None          Medications  ALPRAZolam   1 mg Oral QHS   arformoterol   15 mcg Nebulization BID   budesonide  (PULMICORT ) nebulizer solution  0.25 mg  Nebulization BID   enoxaparin  (LOVENOX ) injection  40 mg Subcutaneous Q24H   furosemide   40 mg Intravenous BID   furosemide   40 mg Intravenous Once   iron  polysaccharides  150 mg Oral Daily   magnesium  oxide  400 mg Oral BID   [START ON 03/19/2024] methylPREDNISolone  (SOLU-MEDROL ) injection  40 mg Intravenous Daily   metoprolol  tartrate  12.5 mg Oral BID   nicotine   21 mg Transdermal Daily   pantoprazole   40 mg Oral Q0600   sodium chloride  flush  3 mL Intravenous Q12H   spironolactone   25 mg Oral Daily      Subjective:   Patient in bed denies any headache chest or abdominal pain, shortness of breath and orthopnea have improved, still has swelling in her legs.  Objective:   Vitals:   03/18/24 0200 03/18/24 0400 03/18/24 0500 03/18/24 0506  BP:  124/64    Pulse: 63 64    Resp: 16  (!) 23    Temp:    (!) 97.3 F (36.3 C)  TempSrc:    Oral  SpO2: 94% 93%    Weight:   60 kg     Intake/Output Summary (Last 24 hours) at 03/18/2024 1004 Last data filed at 03/18/2024 0600 Gross per 24 hour  Intake 240 ml  Output 500 ml  Net -260 ml     Wt Readings from Last 3 Encounters:  03/18/24 60 kg  12/25/23 59.7 kg  09/23/23 61.2 kg     Exam  Awake Alert, No new F.N deficits, Normal affect [place].AT,PERRAL Supple Neck, No JVD,   Symmetrical Chest wall movement, Good air movement bilaterally, few bibasilar crackles RRR,No Gallops, Rubs or new Murmurs,  +ve B.Sounds, Abd Soft, No tenderness,   2+ bipedal edema, also has some varicose veins    Data Review:   Patient Lines/Drains/Airways Status     Active Line/Drains/Airways     Name Placement date Placement time Site Days   Peripheral IV 03/16/24 18 G Anterior;Left Forearm 03/16/24  1847  Forearm  2   Wound 03/17/24 1700 Pressure Injury Coccyx Mid 03/17/24  1700  Coccyx  1             Inpatient Medications  Scheduled Meds:  ALPRAZolam   1 mg Oral QHS   arformoterol   15 mcg Nebulization BID   budesonide  (PULMICORT ) nebulizer solution  0.25 mg Nebulization BID   enoxaparin  (LOVENOX ) injection  40 mg Subcutaneous Q24H   furosemide   40 mg Intravenous BID   furosemide   40 mg Intravenous Once   iron  polysaccharides  150 mg Oral Daily   magnesium  oxide  400 mg Oral BID   [START ON 03/19/2024] methylPREDNISolone  (SOLU-MEDROL ) injection  40 mg Intravenous Daily   metoprolol  tartrate  12.5 mg Oral BID   nicotine   21 mg Transdermal Daily   pantoprazole   40 mg Oral Q0600   sodium chloride  flush  3 mL Intravenous Q12H   spironolactone   25 mg Oral Daily   Continuous Infusions: PRN Meds:.acetaminophen  **OR** acetaminophen , hydrOXYzine , ipratropium-albuterol , ondansetron  **OR** ondansetron  (ZOFRAN ) IV, senna-docusate  DVT Prophylaxis  Place TED hose Start: 03/18/24 0807 enoxaparin  (LOVENOX ) injection 40 mg  Start: 03/16/24 2200       Recent Labs  Lab 03/16/24 1916 03/17/24 0130  WBC 6.2 4.7  HGB 10.3* 11.6*  HCT 33.4* 37.9  PLT 383 411*  MCV 75.2* 75.6*  MCH 23.2* 23.2*  MCHC 30.8 30.6  RDW 18.4* 18.7*  LYMPHSABS 1.1  --   MONOABS 0.6  --  EOSABS 0.0  --   BASOSABS 0.0  --     Recent Labs  Lab 03/16/24 1916 03/17/24 0130 03/17/24 1455 03/17/24 1813 03/18/24 0255  NA 121* 123*  --  127* 127*  K 4.3 4.5  --   --  4.9  CL 86* 87*  --   --  91*  CO2 24 24  --   --  27  ANIONGAP 11 11  --   --  9  GLUCOSE 88 140*  --   --  140*  BUN 16 16  --   --  25*  CREATININE 0.98 0.95  --   --  0.91  AST  --   --  27  --   --   ALT  --   --  15  --   --   ALKPHOS  --   --  113  --   --   BILITOT  --   --  0.8  --   --   ALBUMIN  --   --  3.8  --   --   MG  --  2.0  --   --   --   CALCIUM  8.6* 9.0  --   --  9.5      Recent Labs  Lab 03/16/24 1916 03/17/24 0130 03/18/24 0255  MG  --  2.0  --   CALCIUM  8.6* 9.0 9.5    --------------------------------------------------------------------------------------------------------------- Lab Results  Component Value Date   CHOL 157 02/01/2019   HDL 54 02/01/2019   LDLCALC 88 02/01/2019   TRIG 74 02/01/2019   CHOLHDL 2.9 02/01/2019    Lab Results  Component Value Date   HGBA1C 5.5 01/31/2019   No results for input(s): TSH, T4TOTAL, FREET4, T3FREE, THYROIDAB in the last 72 hours. No results for input(s): VITAMINB12, FOLATE, FERRITIN, TIBC, IRON , RETICCTPCT in the last 72 hours. ------------------------------------------------------------------------------------------------------------------ Cardiac Enzymes No results for input(s): CKMB, TROPONINI, MYOGLOBIN in the last 168 hours.  Invalid input(s): CK  Micro Results No results found for this or any previous visit (from the past 240 hours).  Radiology Reports  DG Chest Portable 1 View Result Date: 03/16/2024 EXAM: 1 VIEW(S) XRAY OF  THE CHEST 03/16/2024 07:09:00 PM COMPARISON: 12/21/2023 CLINICAL HISTORY: hf copd FINDINGS: LUNGS AND PLEURA: Chronic interstitial lung disease and diffuse interstitial prominence. No pleural effusion. No pneumothorax. HEART AND MEDIASTINUM: Mild cardiomegaly. Aortic arch atherosclerosis, mild tortuosity and calcification. BONES AND SOFT TISSUES: Thoracic spondylosis. No acute osseous abnormality. IMPRESSION: 1. No acute findings. 2. Mild cardiomegaly. 3. Chronic interstitial lung disease with diffuse interstitial prominence. Electronically signed by: Greig Pique MD 03/16/2024 07:12 PM EST RP Workstation: [id number]      Signature  -   Lavada Stank M.D on 03/18/2024 at 10:04 AM   -  To page go to [email or web address]         "

## 2024-03-19 DIAGNOSIS — I5030 Unspecified diastolic (congestive) heart failure: Secondary | ICD-10-CM

## 2024-03-19 DIAGNOSIS — J9621 Acute and chronic respiratory failure with hypoxia: Secondary | ICD-10-CM | POA: Diagnosis not present

## 2024-03-19 LAB — CBC WITH DIFFERENTIAL/PLATELET
Abs Immature Granulocytes: 0.02 K/uL (ref 0.00–0.07)
Basophils Absolute: 0 K/uL (ref 0.0–0.1)
Basophils Relative: 0 %
Eosinophils Absolute: 0 K/uL (ref 0.0–0.5)
Eosinophils Relative: 0 %
HCT: 34.8 % — ABNORMAL LOW (ref 36.0–46.0)
Hemoglobin: 10.7 g/dL — ABNORMAL LOW (ref 12.0–15.0)
Immature Granulocytes: 0 %
Lymphocytes Relative: 21 %
Lymphs Abs: 1.6 K/uL (ref 0.7–4.0)
MCH: 22.8 pg — ABNORMAL LOW (ref 26.0–34.0)
MCHC: 30.7 g/dL (ref 30.0–36.0)
MCV: 74.2 fL — ABNORMAL LOW (ref 80.0–100.0)
Monocytes Absolute: 0.8 K/uL (ref 0.1–1.0)
Monocytes Relative: 11 %
Neutro Abs: 5 K/uL (ref 1.7–7.7)
Neutrophils Relative %: 68 %
Platelets: 373 K/uL (ref 150–400)
RBC: 4.69 MIL/uL (ref 3.87–5.11)
RDW: 18.4 % — ABNORMAL HIGH (ref 11.5–15.5)
WBC: 7.3 K/uL (ref 4.0–10.5)
nRBC: 0 % (ref 0.0–0.2)

## 2024-03-19 LAB — COMPREHENSIVE METABOLIC PANEL WITH GFR
ALT: 18 U/L (ref 0–44)
AST: 31 U/L (ref 15–41)
Albumin: 3.7 g/dL (ref 3.5–5.0)
Alkaline Phosphatase: 94 U/L (ref 38–126)
Anion gap: 8 (ref 5–15)
BUN: 27 mg/dL — ABNORMAL HIGH (ref 8–23)
CO2: 33 mmol/L — ABNORMAL HIGH (ref 22–32)
Calcium: 9.2 mg/dL (ref 8.9–10.3)
Chloride: 92 mmol/L — ABNORMAL LOW (ref 98–111)
Creatinine, Ser: 0.94 mg/dL (ref 0.44–1.00)
GFR, Estimated: >60 mL/min
Glucose, Bld: 93 mg/dL (ref 70–99)
Potassium: 4.3 mmol/L (ref 3.5–5.1)
Sodium: 133 mmol/L — ABNORMAL LOW (ref 135–145)
Total Bilirubin: 0.6 mg/dL (ref 0.0–1.2)
Total Protein: 7.2 g/dL (ref 6.5–8.1)

## 2024-03-19 LAB — MAGNESIUM: Magnesium: 2.1 mg/dL (ref 1.7–2.4)

## 2024-03-19 MED ORDER — FUROSEMIDE 10 MG/ML IJ SOLN: 40.0000 mg | Freq: Three times a day (TID) | INTRAMUSCULAR | Status: DC

## 2024-03-19 MED ORDER — CARVEDILOL 6.25 MG PO TABS
6.2500 mg | ORAL_TABLET | Freq: Two times a day (BID) | ORAL | Status: DC
  Administered 2024-03-19: 6.25 mg via ORAL
  Filled 2024-03-19: qty 1

## 2024-03-19 MED ORDER — FUROSEMIDE 10 MG/ML IJ SOLN: 60.0000 mg | Freq: Two times a day (BID) | INTRAMUSCULAR | Status: DC

## 2024-03-19 MED ORDER — METHYLPREDNISOLONE SODIUM SUCC 40 MG IJ SOLR
30.0000 mg | Freq: Every day | INTRAMUSCULAR | Status: DC
  Administered 2024-03-19: 30 mg via INTRAVENOUS
  Filled 2024-03-19: qty 1

## 2024-03-19 MED ORDER — SPIRONOLACTONE 25 MG PO TABS: 50.0000 mg | ORAL_TABLET | Freq: Every day | ORAL | Status: DC

## 2024-03-19 NOTE — Progress Notes (Signed)
"  °  Progress Note  Patient Name: Kathryn Banks Date of Encounter: 03/19/2024 [facility] Cardiologist: None   Interval Summary   No events overnight. Reports some improvement in dyspnea since admission with breathing treatments and IV lasix . She has been having some burning feet.  Vital Signs Vitals:   03/19/24 0448 03/19/24 0500 03/19/24 0832 03/19/24 0838  BP: 135/85  (!) 145/75   Pulse: 65  85   Resp:    20  Temp: 97.9 F (36.6 C)  (!) 97.5 F (36.4 C)   TempSrc: Oral  Oral   SpO2: 97%  95%   Weight:  63.7 kg      Intake/Output Summary (Last 24 hours) at 03/19/2024 1050 Last data filed at 03/19/2024 0947 Gross per 24 hour  Intake 246 ml  Output 2050 ml  Net -1804 ml      03/19/2024    5:00 AM 03/18/2024    5:00 AM 12/25/2023    5:12 AM  Last 3 Weights  Weight (lbs) 140 lb 6.9 oz 132 lb 4.4 oz 131 lb 9.8 oz  Weight (kg) 63.7 kg 60 kg 59.7 kg      Telemetry/ECG  NSR - Personally Reviewed  Physical Exam  GEN: No acute distress.   Neck: No JVD Cardiac: RRR, no murmurs, rubs, or gallops.  Respiratory: Clear to auscultation bilaterally. GI: Soft, nontender, non-distended  Kathryn: 2+ LE edema bilaterally up to mid-shins  Assessment & Plan  Kathryn Banks is a 40 yoF with Hx of COPD/pulmonary fibrosis on 5L, HFpEF (65-70% 9/25), alcoholic cirrhosis who presented with acute on chronic respiratory failure and hyponatremia.  #Acute on chronic respiratory failure #HFpEF exacerbation #COPD exacerbation #Hyponatremia - Improvement in symptoms with breathing treatment and IV diuresis but continues to have dyspnea and evidence of volume overload. - Increase IV lasix  to 40 TID - Increase spironolactone  to 50 mg given Hx of cirrhosis (plan for 5:2 lasix  spiro ratio) - Switch metop to coreg  6.25 for primary prevention of varices and given Hx of COPD - Hyponatremia now resolved - We will continue to follow  For questions or updates, please contact [place]  [facility] Please consult [email or web address] for contact info under   Signed, Joelle VEAR Ren Donley, MD  "

## 2024-03-19 NOTE — Plan of Care (Signed)

## 2024-03-19 NOTE — Plan of Care (Signed)
  Problem: Education: Goal: Knowledge of General Education information will improve Description: Including pain rating scale, medication(s)/side effects and non-pharmacologic comfort measures Outcome: Progressing   Problem: Health Behavior/Discharge Planning: Goal: Ability to manage health-related needs will improve Outcome: Progressing   Problem: Clinical Measurements: Goal: Respiratory complications will improve Outcome: Progressing Goal: Cardiovascular complication will be avoided Outcome: Progressing   Problem: Pain Managment: Goal: General experience of comfort will improve and/or be controlled Outcome: Progressing

## 2024-03-19 NOTE — Discharge Summary (Addendum)
 "                                                                                                                                         AMA  Was paged by the RN around 12.20 pm that patient at this time expresses desire to leave the Hospital immidiately, I talked to the patient as below, patient has been warned that this is not Medically advisable at this time, and can result in Medical complications like Death and Disability, patient understands and accepts the risks involved and assumes full responsibilty of this decision.   Lavada Stank M.D on 03/19/2024 at 5:59 PM  [facility]  Time < 30 minutes  Last Note Below             [facility]                                                                              Kathryn Banks, is a 68 y.o. female, DOB - 1955/09/03, FMW:[id number] Admit date - 03/16/2024    Outpatient Primary MD for the patient is Zachary Idelia LABOR, MD  LOS - 3  days  Chief Complaint  Patient presents with   Shortness of Breath       Brief summary   Patient is a 68 year old female with COPD/pulmonary fibrosis, chronic respiratory failure with hypoxia on 4 L O2 via [place], chronic HFpEF (EF 65-70%), hepatic cirrhosis, HTN, iron  deficiency anemia, tobacco use who presented to the ED for evaluation of shortness of breath.   In the ER workup was consistent with acute on chronic hypoxic respiratory failure due to CHF exacerbation and she was admitted to the hospital.  Assessment & Plan   Acute on chronic respiratory failure with hypoxia (HCC) due to acute on chronic diastolic CHF exacerbation.  EF 65% on recent echocardiogram  - Also has underlying pulmonary fibrosis and COPD, uses 4 L nasal cannula oxygen  at baseline, currently on 6, does have evidence of fluid overload on exam, cardiology following on IV Lasix  for diuresis along with Aldactone , home dose beta-blocker on board.  Appreciate cardiology input.  Clinically improved, encouraged to sit  in chair use I-S and flutter valve for pulmonary toiletry, advance activity and titrate down oxygen .  SpO2: 93 % O2 Flow Rate (L/min): 5 L/min FiO2 (%): 40 %  Acute on chronic HFpEF: See above  COPD/pulmonary fibrosis with acute exacerbation: Continue Brovana /Pulmicort  BID, as needed nebulizer treatments, gentle IV steroids, minimal wheezing, crackles mostly are coarse and consistent with CHF.  On 4 L nasal cannula oxygen  at baseline.  Kindly see #1  for extra details.  Hyponatremia: Due to hypervolemia, diurese and monitor   Hypertension: Continue Lasix , Aldactone , BB.  Hold amlodipine   History of hep C with chronic cirrhosis:  Does not appear to be acutely decompensated though could be contributing to hypervolemia.  Continue spironolactone  and Lasix .  Postdischarge follow-up with GI, PCP to arrange so far has not followed up with a gastroenterologist.   Iron  deficiency anemia:   Continue iron  supplement.  H&H stable   Depression/anxiety: Holding sertraline  for now given hyponatremia.   Tobacco use: Patient reports smoking 0.5 PPD.  Smoking cessation provided, continue NicoDerm patch.   Addendum.  Was paged by the nurse that patient wants to leave AMA, I talked to the patient she said that she has not been happy with the care she has received at [facility] now and in the past, she says she is not trust the treatment she is getting for her congestive heart failure is appropriate, she would like to see an outside cardiologist at [facility], she was counseled to stay in the hospital however she is adamant that she is going to leave AMA.  She was adequately counseled and warned, she accepts any adverse consequences of leaving AGAINST MEDICAL ADVICE.    Estimated body mass index is 24.11 kg/m as calculated from the following:   Height as of 12/21/23: 5' 4 (1.626 m).   Weight as of this encounter: 63.7 kg.  Code Status: Full code DVT Prophylaxis:     Level of Care: Level of  care: Progressive Family Communication: Updated patient Disposition Plan:      Remains inpatient appropriate:      Procedures:    Consultants:   Cardiology  Antimicrobials:   Anti-infectives (From admission, onward)    None          Medications  ALPRAZolam   1 mg Oral QHS   arformoterol   15 mcg Nebulization BID   budesonide  (PULMICORT ) nebulizer solution  0.25 mg Nebulization BID   carvedilol   6.25 mg Oral BID WC   enoxaparin  (LOVENOX ) injection  40 mg Subcutaneous Q24H   furosemide   40 mg Intravenous Q8H   iron  polysaccharides  150 mg Oral Daily   magnesium  oxide  400 mg Oral BID   methylPREDNISolone  (SOLU-MEDROL ) injection  30 mg Intravenous Daily   nicotine   21 mg Transdermal Daily   pantoprazole   40 mg Oral Q0600   sodium chloride  flush  3 mL Intravenous Q12H   [START ON 03/20/2024] spironolactone   50 mg Oral Daily      Subjective:   Patient in bed, appears comfortable, denies any headache, no fever, no chest pain or pressure, improving shortness of breath and orthopnea, still has edema in her legs.  Objective:   Vitals:   03/19/24 0832 03/19/24 0838 03/19/24 1135 03/19/24 1217  BP: (!) 145/75  (!) 146/96 (!) 158/87  Pulse: 85  81 82  Resp:  20    Temp: (!) 97.5 F (36.4 C)  97.6 F (36.4 C)   TempSrc: Oral  Oral   SpO2: 95%  93%   Weight:        Intake/Output Summary (Last 24 hours) at 03/19/2024 1759 Last data filed at 03/19/2024 0947 Gross per 24 hour  Intake 6 ml  Output 500 ml  Net -494 ml     Wt Readings from Last 3 Encounters:  03/19/24 63.7 kg  12/25/23 59.7 kg  09/23/23 61.2 kg     Exam  Awake Alert, No new F.N deficits, Normal  affect [place].AT,PERRAL Supple Neck, No JVD,   Symmetrical Chest wall movement, Good air movement bilaterally, few bibasilar crackles RRR,No Gallops, Rubs or new Murmurs,  +ve B.Sounds, Abd Soft, No tenderness,   2+ bipedal edema, also has some varicose veins    Data Review:   Patient  Lines/Drains/Airways Status     Active Line/Drains/Airways     Name Placement date Placement time Site Days   Peripheral IV 03/16/24 20 G Anterior;Left Forearm 03/16/24  1847  Forearm  3   Wound 03/17/24 1700 Pressure Injury Coccyx Mid 03/17/24  1700  Coccyx  2             Inpatient Medications  Scheduled Meds:  ALPRAZolam   1 mg Oral QHS   arformoterol   15 mcg Nebulization BID   budesonide  (PULMICORT ) nebulizer solution  0.25 mg Nebulization BID   carvedilol   6.25 mg Oral BID WC   enoxaparin  (LOVENOX ) injection  40 mg Subcutaneous Q24H   furosemide   40 mg Intravenous Q8H   iron  polysaccharides  150 mg Oral Daily   magnesium  oxide  400 mg Oral BID   methylPREDNISolone  (SOLU-MEDROL ) injection  30 mg Intravenous Daily   nicotine   21 mg Transdermal Daily   pantoprazole   40 mg Oral Q0600   sodium chloride  flush  3 mL Intravenous Q12H   [START ON 03/20/2024] spironolactone   50 mg Oral Daily   Continuous Infusions: PRN Meds:.acetaminophen  **OR** acetaminophen , hydrOXYzine , ipratropium-albuterol , ondansetron  **OR** ondansetron  (ZOFRAN ) IV, senna-docusate  DVT Prophylaxis     Recent Labs  Lab 03/16/24 1916 03/17/24 0130 03/19/24 0356  WBC 6.2 4.7 7.3  HGB 10.3* 11.6* 10.7*  HCT 33.4* 37.9 34.8*  PLT 383 411* 373  MCV 75.2* 75.6* 74.2*  MCH 23.2* 23.2* 22.8*  MCHC 30.8 30.6 30.7  RDW 18.4* 18.7* 18.4*  LYMPHSABS 1.1  --  1.6  MONOABS 0.6  --  0.8  EOSABS 0.0  --  0.0  BASOSABS 0.0  --  0.0    Recent Labs  Lab 03/16/24 1916 03/17/24 0130 03/17/24 1455 03/17/24 1813 03/18/24 0255 03/19/24 0356  NA 121* 123*  --  127* 127* 133*  K 4.3 4.5  --   --  4.9 4.3  CL 86* 87*  --   --  91* 92*  CO2 24 24  --   --  27 33*  ANIONGAP 11 11  --   --  9 8  GLUCOSE 88 140*  --   --  140* 93  BUN 16 16  --   --  25* 27*  CREATININE 0.98 0.95  --   --  0.91 0.94  AST  --   --  27  --   --  31  ALT  --   --  15  --   --  18  ALKPHOS  --   --  113  --   --  94  BILITOT   --   --  0.8  --   --  0.6  ALBUMIN  --   --  3.8  --   --  3.7  MG  --  2.0  --   --   --  2.1  CALCIUM  8.6* 9.0  --   --  9.5 9.2      Recent Labs  Lab 03/16/24 1916 03/17/24 0130 03/18/24 0255 03/19/24 0356  MG  --  2.0  --  2.1  CALCIUM  8.6* 9.0 9.5 9.2    --------------------------------------------------------------------------------------------------------------- Lab Results  Component Value Date   CHOL 157 02/01/2019   HDL 54 02/01/2019   LDLCALC 88 02/01/2019   TRIG 74 02/01/2019   CHOLHDL 2.9 02/01/2019    Lab Results  Component Value Date   HGBA1C 5.5 01/31/2019   No results for input(s): TSH, T4TOTAL, FREET4, T3FREE, THYROIDAB in the last 72 hours. No results for input(s): VITAMINB12, FOLATE, FERRITIN, TIBC, IRON , RETICCTPCT in the last 72 hours. ------------------------------------------------------------------------------------------------------------------ Cardiac Enzymes No results for input(s): CKMB, TROPONINI, MYOGLOBIN in the last 168 hours.  Invalid input(s): CK  Micro Results No results found for this or any previous visit (from the past 240 hours).  Radiology Reports  No results found.     Signature  -   Lavada Stank M.D on 03/19/2024 at 5:59 PM   -  To page go to [email or web address]     "

## 2024-03-19 NOTE — Progress Notes (Signed)
 Pt requested to leave AMA, does not feel supported with her care here at [facility].Pt has been educated on the healthcare risks of leaving AMA. Doctor notified and paper signed.

## 2024-03-19 NOTE — Progress Notes (Addendum)
 "          [facility]                                                                              Kathryn Banks, is a 68 y.o. female, DOB - [birth year], FMW:[id number] Admit date - 03/16/2024    Outpatient Primary MD for the patient is Kathryn Idelia LABOR, MD  LOS - 3  days  Chief Complaint  Patient presents with   Shortness of Breath       Brief summary   Patient is a 68 year old female with COPD/pulmonary fibrosis, chronic respiratory failure with hypoxia on 4 L O2 via [place], chronic HFpEF (EF 65-70%), hepatic cirrhosis, HTN, iron  deficiency anemia, tobacco use who presented to the ED for evaluation of shortness of breath.   In the ER workup was consistent with acute on chronic hypoxic respiratory failure due to CHF exacerbation and she was admitted to the hospital.  Assessment & Plan   Acute on chronic respiratory failure with hypoxia (HCC) due to acute on chronic diastolic CHF exacerbation.  EF 65% on recent echocardiogram  - Also has underlying pulmonary fibrosis and COPD, uses 4 L nasal cannula oxygen  at baseline, currently on 6, does have evidence of fluid overload on exam, cardiology following on IV Lasix  for diuresis along with Aldactone , home dose beta-blocker on board.  Appreciate cardiology input.  Clinically improved, encouraged to sit in chair use I-S and flutter valve for pulmonary toiletry, advance activity and titrate down oxygen .  SpO2: 95 % O2 Flow Rate (L/min): 5 L/min FiO2 (%): 40 %  Acute on chronic HFpEF: See above  COPD/pulmonary fibrosis with acute exacerbation: Continue Brovana /Pulmicort  BID, as needed nebulizer treatments, gentle IV steroids, minimal wheezing, crackles mostly are coarse and consistent with CHF.  On 4 L nasal cannula oxygen  at baseline.  Kindly see #1 for extra details.  Hyponatremia: Due to hypervolemia, diurese and monitor   Hypertension: Continue Lasix , Aldactone , BB.  Hold amlodipine   History of hep C with chronic cirrhosis:  Does not  appear to be acutely decompensated though could be contributing to hypervolemia.  Continue spironolactone  and Lasix .  Postdischarge follow-up with GI, PCP to arrange so far has not followed up with a gastroenterologist.   Iron  deficiency anemia:   Continue iron  supplement.  H&H stable   Depression/anxiety: Holding sertraline  for now given hyponatremia.   Tobacco use: Patient reports smoking 0.5 PPD.  Smoking cessation provided, continue NicoDerm patch.   Addendum.  Was paged by the nurse that patient wants to leave AMA, I talked to the patient she said that she has not been happy with the care she has received at [facility] now and in the past, she says she is not trust the treatment she is getting for her congestive heart failure is appropriate, she would like to see an outside cardiologist at [facility], she was counseled to stay in the hospital however she is adamant that she is going to leave AMA.  She was adequately counseled and warned, she accepts any adverse consequences of leaving AGAINST MEDICAL ADVICE.    Estimated body mass index is 24.11 kg/m as calculated from the following:   Height  as of 12/21/23: 5' 4 (1.626 m).   Weight as of this encounter: 63.7 kg.  Code Status: Full code DVT Prophylaxis:  Place TED hose Start: 03/18/24 0807 enoxaparin  (LOVENOX ) injection 40 mg Start: 03/16/24 2200   Level of Care: Level of care: Progressive Family Communication: Updated patient Disposition Plan:      Remains inpatient appropriate:      Procedures:    Consultants:   Cardiology  Antimicrobials:   Anti-infectives (From admission, onward)    None          Medications  ALPRAZolam   1 mg Oral QHS   arformoterol   15 mcg Nebulization BID   budesonide  (PULMICORT ) nebulizer solution  0.25 mg Nebulization BID   enoxaparin  (LOVENOX ) injection  40 mg Subcutaneous Q24H   furosemide   60 mg Intravenous BID   iron  polysaccharides  150 mg Oral Daily   magnesium  oxide   400 mg Oral BID   methylPREDNISolone  (SOLU-MEDROL ) injection  30 mg Intravenous Daily   metoprolol  tartrate  12.5 mg Oral BID   nicotine   21 mg Transdermal Daily   pantoprazole   40 mg Oral Q0600   sodium chloride  flush  3 mL Intravenous Q12H   spironolactone   25 mg Oral Daily      Subjective:   Patient in bed, appears comfortable, denies any headache, no fever, no chest pain or pressure, improving shortness of breath and orthopnea, still has edema in her legs.  Objective:   Vitals:   03/19/24 0448 03/19/24 0500 03/19/24 0832 03/19/24 0838  BP: 135/85  (!) 145/75   Pulse: 65  85   Resp:    20  Temp: 97.9 F (36.6 C)  (!) 97.5 F (36.4 C)   TempSrc: Oral  Oral   SpO2: 97%  95%   Weight:  63.7 kg      Intake/Output Summary (Last 24 hours) at 03/19/2024 0855 Last data filed at 03/18/2024 2004 Gross per 24 hour  Intake 240 ml  Output 2050 ml  Net -1810 ml     Wt Readings from Last 3 Encounters:  03/19/24 63.7 kg  12/25/23 59.7 kg  09/23/23 61.2 kg     Exam  Awake Alert, No new F.N deficits, Normal affect [place].AT,PERRAL Supple Neck, No JVD,   Symmetrical Chest wall movement, Good air movement bilaterally, few bibasilar crackles RRR,No Gallops, Rubs or new Murmurs,  +ve B.Sounds, Abd Soft, No tenderness,   2+ bipedal edema, also has some varicose veins    Data Review:   Patient Lines/Drains/Airways Status     Active Line/Drains/Airways     Name Placement date Placement time Site Days   Peripheral IV 03/16/24 20 G Anterior;Left Forearm 03/16/24  1847  Forearm  3   Wound 03/17/24 1700 Pressure Injury Coccyx Mid 03/17/24  1700  Coccyx  2             Inpatient Medications  Scheduled Meds:  ALPRAZolam   1 mg Oral QHS   arformoterol   15 mcg Nebulization BID   budesonide  (PULMICORT ) nebulizer solution  0.25 mg Nebulization BID   enoxaparin  (LOVENOX ) injection  40 mg Subcutaneous Q24H   furosemide   60 mg Intravenous BID   iron  polysaccharides  150 mg  Oral Daily   magnesium  oxide  400 mg Oral BID   methylPREDNISolone  (SOLU-MEDROL ) injection  30 mg Intravenous Daily   metoprolol  tartrate  12.5 mg Oral BID   nicotine   21 mg Transdermal Daily   pantoprazole   40 mg Oral Q0600   sodium  chloride flush  3 mL Intravenous Q12H   spironolactone   25 mg Oral Daily   Continuous Infusions: PRN Meds:.acetaminophen  **OR** acetaminophen , hydrOXYzine , ipratropium-albuterol , ondansetron  **OR** ondansetron  (ZOFRAN ) IV, senna-docusate  DVT Prophylaxis  Place TED hose Start: 03/18/24 0807 enoxaparin  (LOVENOX ) injection 40 mg Start: 03/16/24 2200   Recent Labs  Lab 03/16/24 1916 03/17/24 0130 03/19/24 0356  WBC 6.2 4.7 7.3  HGB 10.3* 11.6* 10.7*  HCT 33.4* 37.9 34.8*  PLT 383 411* 373  MCV 75.2* 75.6* 74.2*  MCH 23.2* 23.2* 22.8*  MCHC 30.8 30.6 30.7  RDW 18.4* 18.7* 18.4*  LYMPHSABS 1.1  --  1.6  MONOABS 0.6  --  0.8  EOSABS 0.0  --  0.0  BASOSABS 0.0  --  0.0    Recent Labs  Lab 03/16/24 1916 03/17/24 0130 03/17/24 1455 03/17/24 1813 03/18/24 0255 03/19/24 0356  NA 121* 123*  --  127* 127* 133*  K 4.3 4.5  --   --  4.9 4.3  CL 86* 87*  --   --  91* 92*  CO2 24 24  --   --  27 33*  ANIONGAP 11 11  --   --  9 8  GLUCOSE 88 140*  --   --  140* 93  BUN 16 16  --   --  25* 27*  CREATININE 0.98 0.95  --   --  0.91 0.94  AST  --   --  27  --   --  31  ALT  --   --  15  --   --  18  ALKPHOS  --   --  113  --   --  94  BILITOT  --   --  0.8  --   --  0.6  ALBUMIN  --   --  3.8  --   --  3.7  MG  --  2.0  --   --   --  2.1  CALCIUM  8.6* 9.0  --   --  9.5 9.2      Recent Labs  Lab 03/16/24 1916 03/17/24 0130 03/18/24 0255 03/19/24 0356  MG  --  2.0  --  2.1  CALCIUM  8.6* 9.0 9.5 9.2    --------------------------------------------------------------------------------------------------------------- Lab Results  Component Value Date   CHOL 157 02/01/2019   HDL 54 02/01/2019   LDLCALC 88 02/01/2019   TRIG 74 02/01/2019    CHOLHDL 2.9 02/01/2019    Lab Results  Component Value Date   HGBA1C 5.5 01/31/2019   No results for input(s): TSH, T4TOTAL, FREET4, T3FREE, THYROIDAB in the last 72 hours. No results for input(s): VITAMINB12, FOLATE, FERRITIN, TIBC, IRON , RETICCTPCT in the last 72 hours. ------------------------------------------------------------------------------------------------------------------ Cardiac Enzymes No results for input(s): CKMB, TROPONINI, MYOGLOBIN in the last 168 hours.  Invalid input(s): CK  Micro Results No results found for this or any previous visit (from the past 240 hours).  Radiology Reports  No results found.     Signature  -   Lavada Stank M.D on 03/19/2024 at 8:55 AM   -  To page go to [email or web address]     "

## 2024-04-26 ENCOUNTER — Ambulatory Visit: Admitting: General Practice

## 2024-05-09 ENCOUNTER — Ambulatory Visit: Admitting: General Practice
